# Patient Record
Sex: Female | Born: 1940
Health system: Southern US, Community
[De-identification: ages and names within clinical notes are randomized; demographics above are authoritative.]

## PROBLEM LIST (undated history)

## (undated) DIAGNOSIS — I8390 Asymptomatic varicose veins of unspecified lower extremity: Secondary | ICD-10-CM

## (undated) DIAGNOSIS — E785 Hyperlipidemia, unspecified: Secondary | ICD-10-CM

## (undated) DIAGNOSIS — C801 Malignant (primary) neoplasm, unspecified: Secondary | ICD-10-CM

## (undated) DIAGNOSIS — T7840XA Allergy, unspecified, initial encounter: Secondary | ICD-10-CM

## (undated) DIAGNOSIS — K635 Polyp of colon: Secondary | ICD-10-CM

## (undated) DIAGNOSIS — I10 Essential (primary) hypertension: Secondary | ICD-10-CM

## (undated) DIAGNOSIS — F419 Anxiety disorder, unspecified: Secondary | ICD-10-CM

## (undated) DIAGNOSIS — M5136 Other intervertebral disc degeneration, lumbar region: Secondary | ICD-10-CM

## (undated) DIAGNOSIS — M51369 Other intervertebral disc degeneration, lumbar region without mention of lumbar back pain or lower extremity pain: Secondary | ICD-10-CM

## (undated) DIAGNOSIS — I809 Phlebitis and thrombophlebitis of unspecified site: Secondary | ICD-10-CM

## (undated) DIAGNOSIS — R739 Hyperglycemia, unspecified: Secondary | ICD-10-CM

## (undated) HISTORY — DX: Essential (primary) hypertension: I10

## (undated) HISTORY — DX: Hyperlipidemia, unspecified: E78.5

## (undated) HISTORY — DX: Polyp of colon: K63.5

## (undated) HISTORY — DX: Allergy, unspecified, initial encounter: T78.40XA

## (undated) HISTORY — DX: Phlebitis and thrombophlebitis of unspecified site: I80.9

## (undated) HISTORY — PX: THROMBECTOMY: PRO61

## (undated) HISTORY — DX: Hyperglycemia, unspecified: R73.9

## (undated) HISTORY — DX: Asymptomatic varicose veins of unspecified lower extremity: I83.90

## (undated) HISTORY — PX: JOINT REPLACEMENT: SHX530

---
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
## 1985-03-16 HISTORY — PX: APPENDECTOMY: SHX54

## 1985-03-16 HISTORY — PX: TOTAL ABDOMINAL HYSTERECTOMY W/ BILATERAL SALPINGOOPHORECTOMY: SHX83

## 1985-03-16 HISTORY — PX: ABDOMINAL HYSTERECTOMY: SHX81

## 2002-03-16 HISTORY — PX: COLECTOMY: SHX59

## 2004-08-21 ENCOUNTER — Ambulatory Visit: Payer: Self-pay | Admitting: Internal Medicine

## 2005-10-28 ENCOUNTER — Ambulatory Visit: Payer: Self-pay | Admitting: Internal Medicine

## 2006-11-25 ENCOUNTER — Ambulatory Visit: Payer: Self-pay | Admitting: Internal Medicine

## 2007-03-17 HISTORY — PX: COLONOSCOPY: SHX174

## 2007-09-29 ENCOUNTER — Ambulatory Visit: Payer: Self-pay | Admitting: General Surgery

## 2007-11-29 ENCOUNTER — Ambulatory Visit: Payer: Self-pay | Admitting: Internal Medicine

## 2008-12-03 ENCOUNTER — Ambulatory Visit: Payer: Self-pay | Admitting: Internal Medicine

## 2010-01-10 ENCOUNTER — Ambulatory Visit: Payer: Self-pay | Admitting: Internal Medicine

## 2011-01-28 ENCOUNTER — Ambulatory Visit: Payer: Self-pay | Admitting: Internal Medicine

## 2011-04-20 DIAGNOSIS — M5380 Other specified dorsopathies, site unspecified: Secondary | ICD-10-CM | POA: Diagnosis not present

## 2011-04-20 DIAGNOSIS — M999 Biomechanical lesion, unspecified: Secondary | ICD-10-CM | POA: Diagnosis not present

## 2011-05-01 DIAGNOSIS — L408 Other psoriasis: Secondary | ICD-10-CM | POA: Diagnosis not present

## 2011-05-01 DIAGNOSIS — L820 Inflamed seborrheic keratosis: Secondary | ICD-10-CM | POA: Diagnosis not present

## 2011-05-11 DIAGNOSIS — E119 Type 2 diabetes mellitus without complications: Secondary | ICD-10-CM | POA: Diagnosis not present

## 2011-05-11 DIAGNOSIS — Z79899 Other long term (current) drug therapy: Secondary | ICD-10-CM | POA: Diagnosis not present

## 2011-05-11 DIAGNOSIS — E7800 Pure hypercholesterolemia, unspecified: Secondary | ICD-10-CM | POA: Diagnosis not present

## 2011-05-20 DIAGNOSIS — E119 Type 2 diabetes mellitus without complications: Secondary | ICD-10-CM | POA: Diagnosis not present

## 2011-05-20 DIAGNOSIS — E7800 Pure hypercholesterolemia, unspecified: Secondary | ICD-10-CM | POA: Diagnosis not present

## 2011-05-20 DIAGNOSIS — I10 Essential (primary) hypertension: Secondary | ICD-10-CM | POA: Diagnosis not present

## 2011-06-02 DIAGNOSIS — M999 Biomechanical lesion, unspecified: Secondary | ICD-10-CM | POA: Diagnosis not present

## 2011-06-02 DIAGNOSIS — M5380 Other specified dorsopathies, site unspecified: Secondary | ICD-10-CM | POA: Diagnosis not present

## 2011-08-06 DIAGNOSIS — J3089 Other allergic rhinitis: Secondary | ICD-10-CM | POA: Diagnosis not present

## 2011-08-06 DIAGNOSIS — J45909 Unspecified asthma, uncomplicated: Secondary | ICD-10-CM | POA: Diagnosis not present

## 2011-08-06 DIAGNOSIS — J069 Acute upper respiratory infection, unspecified: Secondary | ICD-10-CM | POA: Diagnosis not present

## 2011-08-06 DIAGNOSIS — J309 Allergic rhinitis, unspecified: Secondary | ICD-10-CM | POA: Diagnosis not present

## 2011-09-10 DIAGNOSIS — E7800 Pure hypercholesterolemia, unspecified: Secondary | ICD-10-CM | POA: Diagnosis not present

## 2011-09-10 DIAGNOSIS — Z79899 Other long term (current) drug therapy: Secondary | ICD-10-CM | POA: Diagnosis not present

## 2011-09-10 DIAGNOSIS — I10 Essential (primary) hypertension: Secondary | ICD-10-CM | POA: Diagnosis not present

## 2011-09-10 DIAGNOSIS — E119 Type 2 diabetes mellitus without complications: Secondary | ICD-10-CM | POA: Diagnosis not present

## 2011-09-18 DIAGNOSIS — I10 Essential (primary) hypertension: Secondary | ICD-10-CM | POA: Diagnosis not present

## 2011-09-18 DIAGNOSIS — E119 Type 2 diabetes mellitus without complications: Secondary | ICD-10-CM | POA: Diagnosis not present

## 2011-09-18 DIAGNOSIS — E7800 Pure hypercholesterolemia, unspecified: Secondary | ICD-10-CM | POA: Diagnosis not present

## 2011-10-07 DIAGNOSIS — N289 Disorder of kidney and ureter, unspecified: Secondary | ICD-10-CM | POA: Diagnosis not present

## 2011-10-15 DIAGNOSIS — M999 Biomechanical lesion, unspecified: Secondary | ICD-10-CM | POA: Diagnosis not present

## 2011-10-15 DIAGNOSIS — M5380 Other specified dorsopathies, site unspecified: Secondary | ICD-10-CM | POA: Diagnosis not present

## 2011-10-22 DIAGNOSIS — J309 Allergic rhinitis, unspecified: Secondary | ICD-10-CM | POA: Diagnosis not present

## 2011-10-22 DIAGNOSIS — J45909 Unspecified asthma, uncomplicated: Secondary | ICD-10-CM | POA: Diagnosis not present

## 2011-10-22 DIAGNOSIS — J019 Acute sinusitis, unspecified: Secondary | ICD-10-CM | POA: Diagnosis not present

## 2011-10-22 DIAGNOSIS — J3089 Other allergic rhinitis: Secondary | ICD-10-CM | POA: Diagnosis not present

## 2011-11-10 DIAGNOSIS — I83893 Varicose veins of bilateral lower extremities with other complications: Secondary | ICD-10-CM | POA: Diagnosis not present

## 2011-11-10 DIAGNOSIS — Z86010 Personal history of colonic polyps: Secondary | ICD-10-CM | POA: Diagnosis not present

## 2011-12-17 DIAGNOSIS — M999 Biomechanical lesion, unspecified: Secondary | ICD-10-CM | POA: Diagnosis not present

## 2011-12-17 DIAGNOSIS — M5380 Other specified dorsopathies, site unspecified: Secondary | ICD-10-CM | POA: Diagnosis not present

## 2012-01-11 ENCOUNTER — Encounter: Payer: Self-pay | Admitting: Internal Medicine

## 2012-01-11 ENCOUNTER — Ambulatory Visit (INDEPENDENT_AMBULATORY_CARE_PROVIDER_SITE_OTHER): Payer: Medicare Other | Admitting: Internal Medicine

## 2012-01-11 VITALS — BP 127/79 | HR 65 | Temp 98.2°F | Ht 64.5 in | Wt 208.0 lb

## 2012-01-11 DIAGNOSIS — E119 Type 2 diabetes mellitus without complications: Secondary | ICD-10-CM | POA: Diagnosis not present

## 2012-01-11 DIAGNOSIS — I10 Essential (primary) hypertension: Secondary | ICD-10-CM

## 2012-01-11 DIAGNOSIS — J019 Acute sinusitis, unspecified: Secondary | ICD-10-CM | POA: Diagnosis not present

## 2012-01-11 DIAGNOSIS — E7800 Pure hypercholesterolemia, unspecified: Secondary | ICD-10-CM

## 2012-01-11 DIAGNOSIS — Z139 Encounter for screening, unspecified: Secondary | ICD-10-CM

## 2012-01-11 DIAGNOSIS — R7309 Other abnormal glucose: Secondary | ICD-10-CM

## 2012-01-11 DIAGNOSIS — R739 Hyperglycemia, unspecified: Secondary | ICD-10-CM

## 2012-01-11 DIAGNOSIS — Z9109 Other allergy status, other than to drugs and biological substances: Secondary | ICD-10-CM

## 2012-01-11 LAB — LIPID PANEL
Cholesterol: 187 mg/dL (ref 0–200)
HDL: 34.9 mg/dL — ABNORMAL LOW (ref >39.00)
Triglycerides: 203 mg/dL — ABNORMAL HIGH (ref 0.0–149.0)

## 2012-01-11 LAB — BASIC METABOLIC PANEL
CO2: 31 mEq/L (ref 19–32)
Calcium: 9.4 mg/dL (ref 8.4–10.5)
Chloride: 99 mEq/L (ref 96–112)
Sodium: 138 mEq/L (ref 135–145)

## 2012-01-11 LAB — HEMOGLOBIN A1C: Hgb A1c MFr Bld: 6.4 % (ref 4.6–6.5)

## 2012-01-11 LAB — LDL CHOLESTEROL, DIRECT: Direct LDL: 114.6 mg/dL

## 2012-01-11 MED ORDER — PRAVASTATIN SODIUM 40 MG PO TABS: 40.0000 mg | ORAL_TABLET | Freq: Every day | ORAL | Status: DC

## 2012-01-11 MED ORDER — CITALOPRAM HYDROBROMIDE 10 MG PO TABS: 10.0000 mg | ORAL_TABLET | Freq: Every day | ORAL | Status: DC

## 2012-01-11 MED ORDER — METOPROLOL SUCCINATE ER 25 MG PO TB24: 25.0000 mg | ORAL_TABLET | Freq: Two times a day (BID) | ORAL | Status: DC

## 2012-01-11 MED ORDER — HYDROCHLOROTHIAZIDE 25 MG PO TABS: 25.0000 mg | ORAL_TABLET | Freq: Every day | ORAL | Status: DC

## 2012-01-11 MED ORDER — CEFDINIR 300 MG PO CAPS: 300.0000 mg | ORAL_CAPSULE | Freq: Two times a day (BID) | ORAL | Status: DC

## 2012-01-11 NOTE — Patient Instructions (Addendum)
It was nice seeing you today.  I am glad you have been doing well.  I am sorry you have had some issues lately with your sinuses.  I am going to give you an antibiotic Carrie Mills) to take twice a day.  Continue your saline flushes and prescription nasal spray as you have been doing.  Continue mucinex as you have been doing.  Let me know if persistent problems.

## 2012-01-12 ENCOUNTER — Encounter: Payer: Self-pay | Admitting: Internal Medicine

## 2012-01-12 DIAGNOSIS — E7800 Pure hypercholesterolemia, unspecified: Secondary | ICD-10-CM | POA: Insufficient documentation

## 2012-01-12 DIAGNOSIS — I10 Essential (primary) hypertension: Secondary | ICD-10-CM | POA: Insufficient documentation

## 2012-01-12 DIAGNOSIS — E119 Type 2 diabetes mellitus without complications: Secondary | ICD-10-CM | POA: Insufficient documentation

## 2012-01-12 DIAGNOSIS — Z9109 Other allergy status, other than to drugs and biological substances: Secondary | ICD-10-CM | POA: Insufficient documentation

## 2012-01-12 NOTE — Assessment & Plan Note (Signed)
Blood pressure under good control.  Follow.  Check metabolic panel.    

## 2012-01-12 NOTE — Assessment & Plan Note (Signed)
Low cholesterol diet and exercise.  She has declined cholesterol medication.  Check lipid panel.

## 2012-01-12 NOTE — Progress Notes (Signed)
  Subjective:    Patient ID: Carrie Mills, female    DOB: May 20, 1940, 71 y.o.   MRN: [id number]  HPI 71 year old female with past history of hypertension, hypercholesterolemia and hyperglycemia who comes in today for a scheduled follow up.  States she has been doing well.  Feels she is handling stress relatively well.  Has a lot of stress with her sons medical issues.  Sugars (per her report) had been doing well until she was put on Prednisone for a sinus infection.  Saw Dr [place] Callas.  They had started to return to her baseline, but now she has developed more sinus symptoms.  States symptoms returned approximately five days ago.  Increased sinus pressure and ears feel full.  Increased post nasal drainage with sore throat.  Increased nasal congestion - colored mucus.  No chest congestion or tightness.  No sob.    Past Medical History  Diagnosis Date  . Allergy   . Hypertension   . Hyperlipidemia   . Colon polyps   . Phlebitis   . Hyperglycemia       Review of Systems Patient denies any headache, lightheadedness or dizziness.  Sinus symptoms as outlined.  No chest pain, tightness or palpitations.  No increased shortness of breath.  No nausea or vomiting.  No abdominal pain or cramping.  No bowel change, such as diarrhea, constipation, BRBPR or melana.  No urine change.        Objective:   Physical Exam Filed Vitals:   01/11/12 1039  BP: 127/79  Pulse: 65  Temp: 98.2 F (36.8 C)   Blood pressure recheck:  38/30  71 year old female in no acute distress.   HEENT:  Nares - clear except slightly erythematous turbinates.  Right ear canal - impacted with cerumen.  Left clear.  Left TM clear.   OP- without lesions or erythema.  Minimal tenderness to palpation over the maxillary sinus.   NECK:  Supple, nontender.  No audible bruit.   HEART:  Appears to be regular. LUNGS:  Without crackles or wheezing audible.  Respirations even and unlabored.   RADIAL PULSE:  Equal bilaterally.  ABDOMEN:   Soft, nontender.  No audible abdominal bruit.   EXTREMITIES:  No increased edema to be present.  Support hose in place.                Assessment & Plan:  PROBABLE SINUSITIS/URI.  Treat with Omnicef 300mg  2/day as directed.  Saline nasal spray and Nasacort as directed.  Mucinex DM in the am and Robitussin DM in the evening.  Notify me if symptoms do not resolve.    INCREASED PSYCHOSOCIAL STRESSORS.  Doing well on Celexa.  Follow.   HEALTH MAINTENANCE.  Schedule her physical for next visit. Schedule mammogram when due.  Check cholesterol.  She is s/p hysterectomy and does not require pap smears.

## 2012-01-12 NOTE — Assessment & Plan Note (Signed)
Low carb diet and exercise.  Follow.  Check met b and a1c.    

## 2012-01-12 NOTE — Assessment & Plan Note (Signed)
Sees Dr [place] Callas.  Treat the current sinus infection.

## 2012-01-13 ENCOUNTER — Encounter: Payer: Self-pay | Admitting: *Deleted

## 2012-01-13 NOTE — Progress Notes (Signed)
Result letter sent to patient

## 2012-01-26 DIAGNOSIS — H40009 Preglaucoma, unspecified, unspecified eye: Secondary | ICD-10-CM | POA: Diagnosis not present

## 2012-01-29 DIAGNOSIS — L408 Other psoriasis: Secondary | ICD-10-CM | POA: Diagnosis not present

## 2012-01-29 DIAGNOSIS — Z85828 Personal history of other malignant neoplasm of skin: Secondary | ICD-10-CM | POA: Diagnosis not present

## 2012-02-01 DIAGNOSIS — M5380 Other specified dorsopathies, site unspecified: Secondary | ICD-10-CM | POA: Diagnosis not present

## 2012-02-01 DIAGNOSIS — M999 Biomechanical lesion, unspecified: Secondary | ICD-10-CM | POA: Diagnosis not present

## 2012-02-19 ENCOUNTER — Ambulatory Visit: Payer: Self-pay | Admitting: Internal Medicine

## 2012-02-19 DIAGNOSIS — Z1231 Encounter for screening mammogram for malignant neoplasm of breast: Secondary | ICD-10-CM | POA: Diagnosis not present

## 2012-02-19 LAB — HM MAMMOGRAPHY

## 2012-02-20 ENCOUNTER — Telehealth: Payer: Self-pay | Admitting: Internal Medicine

## 2012-02-20 NOTE — Telephone Encounter (Signed)
Updated health maintenance.  mammo 02/19/12 - ok

## 2012-03-02 ENCOUNTER — Other Ambulatory Visit: Payer: Self-pay | Admitting: Internal Medicine

## 2012-03-02 DIAGNOSIS — M999 Biomechanical lesion, unspecified: Secondary | ICD-10-CM | POA: Diagnosis not present

## 2012-03-02 DIAGNOSIS — M5380 Other specified dorsopathies, site unspecified: Secondary | ICD-10-CM | POA: Diagnosis not present

## 2012-03-02 MED ORDER — GLUCOSE BLOOD VI STRP: ORAL_STRIP | Status: DC

## 2012-03-02 NOTE — Telephone Encounter (Signed)
Accu Check test stripes # 100 5 R sent to [facility]

## 2012-03-02 NOTE — Telephone Encounter (Signed)
Accu-chek aviva plus test strip  Use 1 strip once a day test once daily   # 100

## 2012-03-03 ENCOUNTER — Other Ambulatory Visit: Payer: Self-pay

## 2012-03-10 ENCOUNTER — Encounter: Payer: Self-pay | Admitting: Internal Medicine

## 2012-03-14 ENCOUNTER — Other Ambulatory Visit (HOSPITAL_COMMUNITY)
Admission: RE | Admit: 2012-03-14 | Discharge: 2012-03-14 | Disposition: A | Discharge status: Home / Self Care | Payer: Medicare Other | Source: Ambulatory Visit | Attending: Internal Medicine | Admitting: Internal Medicine

## 2012-03-14 ENCOUNTER — Ambulatory Visit (INDEPENDENT_AMBULATORY_CARE_PROVIDER_SITE_OTHER): Payer: Medicare Other | Admitting: Internal Medicine

## 2012-03-14 ENCOUNTER — Encounter: Payer: Self-pay | Admitting: Internal Medicine

## 2012-03-14 VITALS — BP 120/70 | HR 70 | Temp 97.7°F | Ht 64.5 in | Wt 206.2 lb

## 2012-03-14 DIAGNOSIS — I10 Essential (primary) hypertension: Secondary | ICD-10-CM | POA: Diagnosis not present

## 2012-03-14 DIAGNOSIS — Z139 Encounter for screening, unspecified: Secondary | ICD-10-CM | POA: Diagnosis not present

## 2012-03-14 DIAGNOSIS — R7309 Other abnormal glucose: Secondary | ICD-10-CM | POA: Diagnosis not present

## 2012-03-14 DIAGNOSIS — E7800 Pure hypercholesterolemia, unspecified: Secondary | ICD-10-CM

## 2012-03-14 DIAGNOSIS — R739 Hyperglycemia, unspecified: Secondary | ICD-10-CM

## 2012-03-14 DIAGNOSIS — R5381 Other malaise: Secondary | ICD-10-CM

## 2012-03-14 DIAGNOSIS — Z124 Encounter for screening for malignant neoplasm of cervix: Secondary | ICD-10-CM | POA: Diagnosis not present

## 2012-03-14 DIAGNOSIS — Z9109 Other allergy status, other than to drugs and biological substances: Secondary | ICD-10-CM

## 2012-03-14 DIAGNOSIS — R5383 Other fatigue: Secondary | ICD-10-CM

## 2012-03-14 LAB — HM PAP SMEAR

## 2012-03-14 MED ORDER — MOMETASONE FUROATE 0.1 % EX SOLN: CUTANEOUS | Status: DC

## 2012-03-14 MED ORDER — HYDROCORTISONE ACE-PRAMOXINE 1-1 % RE CREA: TOPICAL_CREAM | Freq: Two times a day (BID) | RECTAL | Status: DC

## 2012-03-20 ENCOUNTER — Encounter: Payer: Self-pay | Admitting: Internal Medicine

## 2012-03-20 NOTE — Assessment & Plan Note (Signed)
Sees Dr Sharma.  Stable.    

## 2012-03-20 NOTE — Assessment & Plan Note (Signed)
Low cholesterol diet and exercise.  On no medication.  Follow.

## 2012-03-20 NOTE — Assessment & Plan Note (Signed)
Low carb diet and exercise.  Follow metabolic panel and a1c.  Keep up to date with eye exams.    

## 2012-03-20 NOTE — Assessment & Plan Note (Signed)
Blood pressure under good control.  Same medication regimen.  Check metabolic panel.    

## 2012-03-20 NOTE — Progress Notes (Signed)
Subjective:    Patient ID: Carrie Mills, female    DOB: 12-May-1940, 72 y.o.   MRN: [id number]  HPI 72 year old female with past history of hypertension, hypercholesterolemia and hyperglycemia who comes in today to follow up on these issues as well as for a complete physical exam.  States she has been doing well.  Feels she is handling stress relatively well.  Has a lot of stress with her sons medical issues.  Sugars in the am averaging 109-123 and pm sugars averaging 120-150.  Breathing is stable.  Symptoms have improved.  She does intermittently have some left back discomfort and hips and knees bother her at times.  Takes occasional alleve.  No nausea or vomiting.  No cardiac symptoms with increased activity or exertion.   Past Medical History  Diagnosis Date  . Allergy   . Hypertension   . Hyperlipidemia   . Colon polyps   . Phlebitis   . Hyperglycemia      Current Outpatient Prescriptions on File Prior to Visit  Medication Sig Dispense Refill  . budesonide-formoterol (SYMBICORT) 160-4.5 MCG/ACT inhaler Inhale 2 puffs into the lungs daily.      . Calcium Carbonate Antacid 400 MG CHEW Chew by mouth daily.      . cefdinir (OMNICEF) 300 MG capsule Take 1 capsule (300 mg total) by mouth 2 (two) times daily.  20 capsule  0  . citalopram (CELEXA) 10 MG tablet Take 1 tablet (10 mg total) by mouth daily.  90 tablet  3  . Coconut Oil 1000 MG CAPS Take by mouth.      . Glucosamine HCl 1000 MG TABS Take by mouth.      Marland Kitchen glucose blood (ACCU-CHEK AVIVA) test strip Test blood sugar once a day or as instructed. DX 250.0  100 each  5  . hydrochlorothiazide (HYDRODIURIL) 25 MG tablet Take 1 tablet (25 mg total) by mouth daily.  90 tablet  3  . metoprolol succinate (TOPROL-XL) 25 MG 24 hr tablet Take 1 tablet (25 mg total) by mouth 2 (two) times daily.  180 tablet  3  . Multiple Vitamin (MULTIVITAMIN) capsule Take 1 capsule by mouth daily.      . Omega-3 Fatty Acids (FISH OIL) 1200 MG CAPS Take by  mouth daily.      . pravastatin (PRAVACHOL) 40 MG tablet Take 1 tablet (40 mg total) by mouth daily.  90 tablet  3  . sodium chloride (OCEAN) 0.65 % nasal spray Place 1 spray into the nose as needed.      . triamcinolone (NASACORT) 55 MCG/ACT nasal inhaler Place 2 sprays into the nose daily.         Review of Systems Patient denies any headache, lightheadedness or dizziness.  No significant sinus or allergy symptoms.  No chest pain, tightness or palpitations.  No increased shortness of breath.  No nausea or vomiting.  No abdominal pain or cramping. No acid reflux.  No bowel change, such as diarrhea, constipation, BRBPR or melana.  No urine change.        Objective:   Physical Exam  Filed Vitals:   03/14/12 0908  BP: 120/70  Pulse: 70  Temp: 97.7 F (36.5 C)   Blood pressure recheck:  37/27  72 year old female in no acute distress.   HEENT:  Nares- clear.  Oropharynx - without lesions. NECK:  Supple.  Nontender.  No audible bruit.  HEART:  Appears to be regular. LUNGS:  No crackles or  wheezing audible.  Respirations even and unlabored.  RADIAL PULSE:  Equal bilaterally.    BREASTS:  No nipple discharge or nipple retraction present.  Could not appreciate any distinct nodules or axillary adenopathy.  ABDOMEN:  Soft, nontender.  Bowel sounds present and normal.  No audible abdominal bruit.  GU:  Normal external genitalia.  Vaginal vault without lesions.  S/p hysterectomy.  Could not appreciate any adnexal masses or tenderness.   RECTAL:  Heme negative.   EXTREMITIES:  No increased edema present.  DP pulses palpable and equal bilaterally.       Assessment & Plan:  REOCCURRING ALLERGY/SINUS ISSUES.  Sees Dr [place] Callas.  Doing well.      INCREASED PSYCHOSOCIAL STRESSORS.  Doing well on Celexa.  Follow.   HEALTH MAINTENANCE.  Physical today.  Mammogram 02/19/12 - BiRADS II.   She is s/p hysterectomy and does not require pap smears.  IFOB.

## 2012-04-01 ENCOUNTER — Telehealth: Payer: Self-pay | Admitting: General Practice

## 2012-04-01 NOTE — Telephone Encounter (Signed)
Melanie from [facility] called wanting to know what records we needed for pt. Their office closes at 1pm. Please call her back at [phone].

## 2012-04-04 NOTE — Telephone Encounter (Signed)
Dr. Lorin Picket, if you tell me what records we need from them I will be glad to call them back.

## 2012-04-04 NOTE — Telephone Encounter (Signed)
I am not sure.  Do they have any recent information on pt?  If they do then if they could send the last couple of notes.  Thanks.

## 2012-04-06 NOTE — Telephone Encounter (Signed)
I called and spoke with Carrie Mills and she is going to fax over the last few notes.

## 2012-04-07 DIAGNOSIS — J3089 Other allergic rhinitis: Secondary | ICD-10-CM | POA: Diagnosis not present

## 2012-04-07 DIAGNOSIS — J019 Acute sinusitis, unspecified: Secondary | ICD-10-CM | POA: Diagnosis not present

## 2012-04-07 DIAGNOSIS — J45909 Unspecified asthma, uncomplicated: Secondary | ICD-10-CM | POA: Diagnosis not present

## 2012-04-07 DIAGNOSIS — J209 Acute bronchitis, unspecified: Secondary | ICD-10-CM | POA: Diagnosis not present

## 2012-04-07 DIAGNOSIS — J309 Allergic rhinitis, unspecified: Secondary | ICD-10-CM | POA: Diagnosis not present

## 2012-04-13 ENCOUNTER — Ambulatory Visit: Payer: Self-pay | Admitting: Allergy

## 2012-04-13 DIAGNOSIS — J019 Acute sinusitis, unspecified: Secondary | ICD-10-CM | POA: Diagnosis not present

## 2012-04-13 DIAGNOSIS — J45909 Unspecified asthma, uncomplicated: Secondary | ICD-10-CM | POA: Diagnosis not present

## 2012-04-28 ENCOUNTER — Encounter: Payer: Self-pay | Admitting: Internal Medicine

## 2012-04-28 ENCOUNTER — Telehealth: Payer: Self-pay | Admitting: Internal Medicine

## 2012-04-28 NOTE — Telephone Encounter (Signed)
Opened in error

## 2012-04-29 ENCOUNTER — Telehealth: Payer: Self-pay | Admitting: Internal Medicine

## 2012-04-29 MED ORDER — CITALOPRAM HYDROBROMIDE 10 MG PO TABS: 10.0000 mg | ORAL_TABLET | Freq: Every day | ORAL | Status: DC

## 2012-04-29 MED ORDER — HYDROCHLOROTHIAZIDE 25 MG PO TABS: 25.0000 mg | ORAL_TABLET | Freq: Every day | ORAL | Status: DC

## 2012-04-29 MED ORDER — METOPROLOL SUCCINATE ER 25 MG PO TB24: 25.0000 mg | ORAL_TABLET | Freq: Two times a day (BID) | ORAL | Status: DC

## 2012-04-29 NOTE — Telephone Encounter (Signed)
[facility] with 3 refills for celexa, toprol and hctz

## 2012-04-30 ENCOUNTER — Other Ambulatory Visit: Payer: Self-pay

## 2012-05-24 DIAGNOSIS — M5380 Other specified dorsopathies, site unspecified: Secondary | ICD-10-CM | POA: Diagnosis not present

## 2012-05-24 DIAGNOSIS — M999 Biomechanical lesion, unspecified: Secondary | ICD-10-CM | POA: Diagnosis not present

## 2012-06-09 ENCOUNTER — Other Ambulatory Visit (INDEPENDENT_AMBULATORY_CARE_PROVIDER_SITE_OTHER): Payer: Medicare Other

## 2012-06-09 DIAGNOSIS — I10 Essential (primary) hypertension: Secondary | ICD-10-CM | POA: Diagnosis not present

## 2012-06-09 DIAGNOSIS — R5381 Other malaise: Secondary | ICD-10-CM | POA: Diagnosis not present

## 2012-06-09 DIAGNOSIS — R7309 Other abnormal glucose: Secondary | ICD-10-CM

## 2012-06-09 DIAGNOSIS — E7800 Pure hypercholesterolemia, unspecified: Secondary | ICD-10-CM | POA: Diagnosis not present

## 2012-06-09 DIAGNOSIS — R739 Hyperglycemia, unspecified: Secondary | ICD-10-CM

## 2012-06-09 DIAGNOSIS — R5383 Other fatigue: Secondary | ICD-10-CM

## 2012-06-09 LAB — COMPREHENSIVE METABOLIC PANEL
Albumin: 3.7 g/dL (ref 3.5–5.2)
Alkaline Phosphatase: 50 U/L (ref 39–117)
BUN: 13 mg/dL (ref 6–23)
CO2: 30 mEq/L (ref 19–32)
GFR: 68.68 mL/min (ref >60.00)
Glucose, Bld: 105 mg/dL — ABNORMAL HIGH (ref 70–99)
Total Bilirubin: 0.7 mg/dL (ref 0.3–1.2)
Total Protein: 6.7 g/dL (ref 6.0–8.3)

## 2012-06-09 LAB — CBC WITH DIFFERENTIAL/PLATELET
Basophils Relative: 0.5 % (ref 0.0–3.0)
Eosinophils Relative: 6.1 % — ABNORMAL HIGH (ref 0.0–5.0)
HCT: 37.5 % (ref 36.0–46.0)
Hemoglobin: 12.6 g/dL (ref 12.0–15.0)
Lymphs Abs: 2.8 10*3/uL (ref 0.7–4.0)
MCV: 89.3 fl (ref 78.0–100.0)
Monocytes Absolute: 0.6 10*3/uL (ref 0.1–1.0)
Monocytes Relative: 7.1 % (ref 3.0–12.0)
RBC: 4.2 Mil/uL (ref 3.87–5.11)
WBC: 9 10*3/uL (ref 4.5–10.5)

## 2012-06-09 LAB — LIPID PANEL
HDL: 35.2 mg/dL — ABNORMAL LOW (ref >39.00)
Total CHOL/HDL Ratio: 5
Triglycerides: 173 mg/dL — ABNORMAL HIGH (ref 0.0–149.0)

## 2012-06-09 LAB — TSH: TSH: 2.45 u[IU]/mL (ref 0.35–5.50)

## 2012-06-09 LAB — HEMOGLOBIN A1C: Hgb A1c MFr Bld: 6.6 % — ABNORMAL HIGH (ref 4.6–6.5)

## 2012-06-14 ENCOUNTER — Telehealth: Payer: Self-pay | Admitting: Internal Medicine

## 2012-06-14 ENCOUNTER — Other Ambulatory Visit (INDEPENDENT_AMBULATORY_CARE_PROVIDER_SITE_OTHER): Payer: Medicare Other

## 2012-06-14 DIAGNOSIS — Z139 Encounter for screening, unspecified: Secondary | ICD-10-CM | POA: Diagnosis not present

## 2012-06-14 LAB — FECAL OCCULT BLOOD, IMMUNOCHEMICAL: Fecal Occult Bld: NEGATIVE

## 2012-06-14 NOTE — Telephone Encounter (Signed)
Notified of lab result - negative stool test via my chart.

## 2012-06-15 ENCOUNTER — Ambulatory Visit (INDEPENDENT_AMBULATORY_CARE_PROVIDER_SITE_OTHER): Payer: Medicare Other | Admitting: Internal Medicine

## 2012-06-15 ENCOUNTER — Encounter: Payer: Self-pay | Admitting: Internal Medicine

## 2012-06-15 VITALS — BP 120/60 | HR 69 | Temp 98.1°F | Ht 64.5 in | Wt 206.5 lb

## 2012-06-15 DIAGNOSIS — R739 Hyperglycemia, unspecified: Secondary | ICD-10-CM

## 2012-06-15 DIAGNOSIS — M25562 Pain in left knee: Secondary | ICD-10-CM

## 2012-06-15 DIAGNOSIS — E7800 Pure hypercholesterolemia, unspecified: Secondary | ICD-10-CM

## 2012-06-15 DIAGNOSIS — I10 Essential (primary) hypertension: Secondary | ICD-10-CM

## 2012-06-15 DIAGNOSIS — R7309 Other abnormal glucose: Secondary | ICD-10-CM | POA: Diagnosis not present

## 2012-06-15 DIAGNOSIS — Z9109 Other allergy status, other than to drugs and biological substances: Secondary | ICD-10-CM | POA: Diagnosis not present

## 2012-06-15 DIAGNOSIS — M25569 Pain in unspecified knee: Secondary | ICD-10-CM | POA: Diagnosis not present

## 2012-06-15 MED ORDER — CEPHALEXIN 500 MG PO CAPS: 500.0000 mg | ORAL_CAPSULE | Freq: Three times a day (TID) | ORAL | Status: DC

## 2012-06-15 NOTE — Assessment & Plan Note (Signed)
Low carb diet and exercise.  Follow metabolic panel and a1c.  Keep up to date with eye exams.  A1c just checked - 6.6.  Follow.

## 2012-06-15 NOTE — Assessment & Plan Note (Signed)
Sees Dr [place] Callas.  Just treated for sinus infection.  Doing well now.  Follow.

## 2012-06-15 NOTE — Progress Notes (Signed)
Subjective:    Patient ID: Carrie Mills, female    DOB: 10/04/1940, 72 y.o.   MRN: [id number]  HPI 72 year old female with past history of hypertension, hypercholesterolemia and hyperglycemia who comes in today for a scheduled follow up.  States she has been doing well.  Feels she is handling stress relatively well.  Has a lot of stress with her sons medical issues.  Sugars in the am averaging 100-120s and pm sugars averaging 120-160.  Breathing is stable.   No nausea or vomiting.  No cardiac symptoms with increased activity or exertion.  Is riding her bike.  Not able to walk.  Her knee limits her.  Has been having increased knee pain for a while and is now ready to have it looked at.  Wants to see Dr Ernest Pine.   Saw Dr [place] Callas.  Had CXR - ok.  CT sinuses.  Infection.  Treated with Levaquin for 10 days.  Is having some increased pain and redness in her left thumb.  Just a localized area.  Able to bend without significant problems.     Past Medical History  Diagnosis Date  . Allergy   . Hypertension   . Hyperlipidemia   . Colon polyps   . Phlebitis   . Hyperglycemia      Current Outpatient Prescriptions on File Prior to Visit  Medication Sig Dispense Refill  . budesonide-formoterol (SYMBICORT) 160-4.5 MCG/ACT inhaler Inhale 2 puffs into the lungs daily.      . Calcium Carbonate Antacid 400 MG CHEW Chew by mouth daily.      . citalopram (CELEXA) 10 MG tablet Take 1 tablet (10 mg total) by mouth daily.  90 tablet  3  . Coconut Oil 1000 MG CAPS Take by mouth.      . Glucosamine HCl 1000 MG TABS Take by mouth.      Marland Kitchen glucose blood (ACCU-CHEK AVIVA) test strip Test blood sugar once a day or as instructed. DX 250.0  100 each  5  . hydrochlorothiazide (HYDRODIURIL) 25 MG tablet Take 1 tablet (25 mg total) by mouth daily.  90 tablet  3  . metoprolol succinate (TOPROL-XL) 25 MG 24 hr tablet Take 1 tablet (25 mg total) by mouth 2 (two) times daily.  180 tablet  3  . mometasone (ELOCON) 0.1 % lotion  Use as directed  60 mL  0  . Multiple Vitamin (MULTIVITAMIN) capsule Take 1 capsule by mouth daily.      . Omega-3 Fatty Acids (FISH OIL) 1200 MG CAPS Take by mouth daily.      . pramoxine-hydrocortisone (ANALPRAM-HC) 1-1 % rectal cream Place rectally 2 (two) times daily.  30 g  0  . pravastatin (PRAVACHOL) 40 MG tablet Take 1 tablet (40 mg total) by mouth daily.  90 tablet  3  . sodium chloride (OCEAN) 0.65 % nasal spray Place 1 spray into the nose as needed.      . triamcinolone (NASACORT) 55 MCG/ACT nasal inhaler Place 2 sprays into the nose daily.      . cefdinir (OMNICEF) 300 MG capsule Take 1 capsule (300 mg total) by mouth 2 (two) times daily.  20 capsule  0   No current facility-administered medications on file prior to visit.     Review of Systems Patient denies any headache, lightheadedness or dizziness.  No significant sinus or allergy symptoms now.  Just treated with Levaquin for sinus infection.   No chest pain, tightness or palpitations.  No increased shortness  of breath.  No nausea or vomiting.  No abdominal pain or cramping. No acid reflux.  No bowel change, such as diarrhea, constipation, BRBPR or melana.  No urine change.  Persistent knee pain.  Request referral to ortho.      Objective:   Physical Exam  Filed Vitals:   06/15/12 0943  BP: 120/60  Pulse: 69  Temp: 98.1 F (36.7 C)   Blood pressure recheck:  41/75  72 year old female in no acute distress.   HEENT:  Nares- clear.  Oropharynx - without lesions. NECK:  Supple.  Nontender.  No audible bruit.  HEART:  Appears to be regular. LUNGS:  No crackles or wheezing audible.  Respirations even and unlabored.  RADIAL PULSE:  Equal bilaterally.   ABDOMEN:  Soft, nontender.  Bowel sounds present and normal.  No audible abdominal bruit.    EXTREMITIES:  No increased edema present.  DP pulses palpable and equal bilaterally.   SKIN:  Minimal localized are of redness just below the nail bed - left thumb.  Minima  tenderness.  Able to flex and extend the thumb without increased pain.    Assessment & Plan:  REOCCURRING ALLERGY/SINUS ISSUES.  Sees Dr [place] Callas.  Doing well now.  Just treated for sinus infection.  Follow.    POSSIBLE CELLULITIS.  Base of thumb.  Exam as outlined.  Treat with Keflex 500mg  tid x 1 week.  Warm compresses.  Call with update.    MSK.  Persistent knee pain.  Refer to Dr Ernest Pine for evaluation.     INCREASED PSYCHOSOCIAL STRESSORS.  Doing well on Celexa.  Follow.   HEALTH MAINTENANCE.  Physical 03/14/12.  Mammogram 02/19/12 - BiRADS II.   She is s/p hysterectomy and does not require pap smears.

## 2012-06-15 NOTE — Assessment & Plan Note (Signed)
Low cholesterol diet and exercise.  On no medication.  Follow.

## 2012-06-15 NOTE — Assessment & Plan Note (Signed)
Blood pressure under good control.  Same medication regimen.  Follow metabolic panel.   

## 2012-08-02 DIAGNOSIS — M1710 Unilateral primary osteoarthritis, unspecified knee: Secondary | ICD-10-CM | POA: Diagnosis not present

## 2012-08-05 ENCOUNTER — Encounter: Payer: Self-pay | Admitting: *Deleted

## 2012-08-24 DIAGNOSIS — M549 Dorsalgia, unspecified: Secondary | ICD-10-CM | POA: Diagnosis not present

## 2012-08-24 DIAGNOSIS — M5380 Other specified dorsopathies, site unspecified: Secondary | ICD-10-CM | POA: Diagnosis not present

## 2012-08-24 DIAGNOSIS — M5137 Other intervertebral disc degeneration, lumbosacral region: Secondary | ICD-10-CM | POA: Diagnosis not present

## 2012-08-24 DIAGNOSIS — M25569 Pain in unspecified knee: Secondary | ICD-10-CM | POA: Diagnosis not present

## 2012-08-24 DIAGNOSIS — M999 Biomechanical lesion, unspecified: Secondary | ICD-10-CM | POA: Diagnosis not present

## 2012-10-06 DIAGNOSIS — M999 Biomechanical lesion, unspecified: Secondary | ICD-10-CM | POA: Diagnosis not present

## 2012-10-06 DIAGNOSIS — J3089 Other allergic rhinitis: Secondary | ICD-10-CM | POA: Diagnosis not present

## 2012-10-06 DIAGNOSIS — J309 Allergic rhinitis, unspecified: Secondary | ICD-10-CM | POA: Diagnosis not present

## 2012-10-06 DIAGNOSIS — J45909 Unspecified asthma, uncomplicated: Secondary | ICD-10-CM | POA: Diagnosis not present

## 2012-10-06 DIAGNOSIS — M5380 Other specified dorsopathies, site unspecified: Secondary | ICD-10-CM | POA: Diagnosis not present

## 2012-10-06 DIAGNOSIS — M25569 Pain in unspecified knee: Secondary | ICD-10-CM | POA: Diagnosis not present

## 2012-10-06 DIAGNOSIS — M549 Dorsalgia, unspecified: Secondary | ICD-10-CM | POA: Diagnosis not present

## 2012-10-06 DIAGNOSIS — M5137 Other intervertebral disc degeneration, lumbosacral region: Secondary | ICD-10-CM | POA: Diagnosis not present

## 2012-10-19 ENCOUNTER — Other Ambulatory Visit: Payer: Self-pay

## 2012-10-24 ENCOUNTER — Encounter: Payer: Self-pay | Admitting: Internal Medicine

## 2012-10-24 ENCOUNTER — Ambulatory Visit (INDEPENDENT_AMBULATORY_CARE_PROVIDER_SITE_OTHER): Payer: Medicare Other | Admitting: Internal Medicine

## 2012-10-24 VITALS — BP 130/70 | HR 63 | Temp 98.2°F | Ht 64.5 in | Wt 202.8 lb

## 2012-10-24 DIAGNOSIS — E7800 Pure hypercholesterolemia, unspecified: Secondary | ICD-10-CM | POA: Diagnosis not present

## 2012-10-24 DIAGNOSIS — I10 Essential (primary) hypertension: Secondary | ICD-10-CM | POA: Diagnosis not present

## 2012-10-24 DIAGNOSIS — R739 Hyperglycemia, unspecified: Secondary | ICD-10-CM

## 2012-10-24 DIAGNOSIS — Z9109 Other allergy status, other than to drugs and biological substances: Secondary | ICD-10-CM

## 2012-10-24 DIAGNOSIS — R7309 Other abnormal glucose: Secondary | ICD-10-CM | POA: Diagnosis not present

## 2012-10-24 LAB — HEMOGLOBIN A1C: Hgb A1c MFr Bld: 6.7 % — ABNORMAL HIGH (ref 4.6–6.5)

## 2012-10-24 LAB — COMPREHENSIVE METABOLIC PANEL
ALT: 25 U/L (ref 0–35)
Albumin: 3.7 g/dL (ref 3.5–5.2)
CO2: 29 mEq/L (ref 19–32)
Calcium: 9.3 mg/dL (ref 8.4–10.5)
Chloride: 98 mEq/L (ref 96–112)
GFR: 67.7 mL/min (ref >60.00)
Glucose, Bld: 112 mg/dL — ABNORMAL HIGH (ref 70–99)
Sodium: 137 mEq/L (ref 135–145)
Total Bilirubin: 0.9 mg/dL (ref 0.3–1.2)
Total Protein: 6.8 g/dL (ref 6.0–8.3)

## 2012-10-24 LAB — LIPID PANEL
Cholesterol: 163 mg/dL (ref 0–200)
VLDL: 40 mg/dL (ref 0.0–40.0)

## 2012-10-24 NOTE — Progress Notes (Signed)
Subjective:    Patient ID: Carrie Mills, female    DOB: [birth year], 72 y.o.   MRN: [id number]  HPI 72 year old female with past history of hypertension, hypercholesterolemia and hyperglycemia who comes in today for a scheduled follow up.  States she has been doing well.  Feels she is handling stress relatively well.  Has a lot of stress with her sons medical issues.  Noticed increased sugars back at the end of May and first of June.  Had increased stress.  Also increased pain in her knee.  felt this may have contributed.  Sugars have improved now.  Sugars in the am averaging 110-120s and pm sugars averaging 130-140s now.  Breathing is stable.   No nausea or vomiting.  No cardiac symptoms with increased activity or exertion.  Is riding her bike. Rode eight miles yesterday.  No chest pain or tightness with increased activity or exertion.   Not able to walk.  Her knee limits her. Has been having increased knee pain for a while and is now ready to have it looked at.  Planning to see Dr Ernest Pine tomorrow.   Sees Dr [place] Callas.  Allergies stable.  Overall she feels she is doing well.     Past Medical History  Diagnosis Date  . Allergy   . Hyperlipidemia   . Colon polyps   . Phlebitis   . Hyperglycemia   . Hypertension   . Varicose veins      Current Outpatient Prescriptions on File Prior to Visit  Medication Sig Dispense Refill  . budesonide-formoterol (SYMBICORT) 160-4.5 MCG/ACT inhaler Inhale 2 puffs into the lungs daily.      . Calcium Carbonate Antacid 400 MG CHEW Chew by mouth daily.      . citalopram (CELEXA) 10 MG tablet Take 1 tablet (10 mg total) by mouth daily.  90 tablet  3  . Coconut Oil 1000 MG CAPS Take by mouth.      . Glucosamine HCl 1000 MG TABS Take by mouth.      Marland Kitchen glucose blood (ACCU-CHEK AVIVA) test strip Test blood sugar once a day or as instructed. DX 250.0  100 each  5  . hydrochlorothiazide (HYDRODIURIL) 25 MG tablet Take 1 tablet (25 mg total) by mouth daily.  90 tablet  3   . metoprolol succinate (TOPROL-XL) 25 MG 24 hr tablet Take 1 tablet (25 mg total) by mouth 2 (two) times daily.  180 tablet  3  . mometasone (ELOCON) 0.1 % lotion Use as directed  60 mL  0  . Montelukast Sodium (SINGULAIR PO) Take by mouth daily.      . Multiple Vitamin (MULTIVITAMIN) capsule Take 1 capsule by mouth daily.      . Omega-3 Fatty Acids (FISH OIL) 1200 MG CAPS Take by mouth daily.      . pramoxine-hydrocortisone (ANALPRAM-HC) 1-1 % rectal cream Place rectally 2 (two) times daily.  30 g  0  . pravastatin (PRAVACHOL) 40 MG tablet Take 1 tablet (40 mg total) by mouth daily.  90 tablet  3  . sodium chloride (OCEAN) 0.65 % nasal spray Place 1 spray into the nose as needed.      . triamcinolone (NASACORT) 55 MCG/ACT nasal inhaler Place 2 sprays into the nose daily.       No current facility-administered medications on file prior to visit.     Review of Systems Patient denies any headache, lightheadedness or dizziness.  No significant sinus or allergy symptoms now.  No  chest pain, tightness or palpitations.  See above.  No cardiac symptoms with increased activity or exertion.   No increased shortness of breath.  No nausea or vomiting.  No abdominal pain or cramping. No acid reflux.  No bowel change, such as diarrhea, constipation, BRBPR or melana.   No urine change.  Persistent knee pain.  Worsening.  Planning to see Dr Ernest Pine tomorrow.      Objective:   Physical Exam  Filed Vitals:   10/24/12 1005  BP: 130/70  Pulse: 63  Temp: 98.2 F (36.8 C)   Blood pressure recheck:  122/74, pulse 36  72 year old female in no acute distress.   HEENT:  Nares- clear.  Oropharynx - without lesions. NECK:  Supple.  Nontender.  No audible bruit.  HEART:  Appears to be regular. LUNGS:  No crackles or wheezing audible.  Respirations even and unlabored.  RADIAL PULSE:  Equal bilaterally.   ABDOMEN:  Soft, nontender.  Bowel sounds present and normal.  No audible abdominal bruit.    EXTREMITIES:   No increased edema present.  DP pulses palpable and equal bilaterally.   FEET:  Without lesions.     Assessment & Plan:  REOCCURRING ALLERGY/SINUS ISSUES.  Sees Dr [place] Callas.  Doing well now.     MSK.  Persistent knee pain.  Planning to see Dr Ernest Pine tomorrow.      INCREASED PSYCHOSOCIAL STRESSORS.  Doing well on Celexa.  Follow.   HEALTH MAINTENANCE.  Physical 03/14/12.  Mammogram 02/19/12 - BiRADS II.   She is s/p hysterectomy and does not require pap smears.

## 2012-10-25 ENCOUNTER — Encounter: Payer: Self-pay | Admitting: Internal Medicine

## 2012-10-25 DIAGNOSIS — M1710 Unilateral primary osteoarthritis, unspecified knee: Secondary | ICD-10-CM | POA: Diagnosis not present

## 2012-10-25 LAB — MICROALBUMIN / CREATININE URINE RATIO
Creatinine,U: 69.3 mg/dL
Microalb Creat Ratio: 0.9 mg/g (ref 0.0–30.0)
Microalb, Ur: 0.6 mg/dL (ref 0.0–1.9)

## 2012-10-25 NOTE — Assessment & Plan Note (Signed)
Low cholesterol diet and exercise.  On no medication.  Follow.

## 2012-10-25 NOTE — Assessment & Plan Note (Signed)
Blood pressure under good control.  Same medication regimen.  Check metabolic panel.    

## 2012-10-25 NOTE — Assessment & Plan Note (Signed)
Low carb diet and exercise.  Follow metabolic panel and a1c.  Keep up to date with eye exams.  A1c last checked - 6.6.  Follow.   

## 2012-10-25 NOTE — Assessment & Plan Note (Signed)
Sees Dr [place] Callas.  Doing well now.  Follow.

## 2012-10-26 ENCOUNTER — Encounter: Payer: Self-pay | Admitting: Internal Medicine

## 2012-10-27 ENCOUNTER — Ambulatory Visit: Payer: Self-pay | Admitting: General Surgery

## 2012-11-15 ENCOUNTER — Ambulatory Visit (INDEPENDENT_AMBULATORY_CARE_PROVIDER_SITE_OTHER): Payer: Medicare Other | Admitting: General Surgery

## 2012-11-15 ENCOUNTER — Other Ambulatory Visit: Payer: Self-pay | Admitting: *Deleted

## 2012-11-15 ENCOUNTER — Encounter: Payer: Self-pay | Admitting: General Surgery

## 2012-11-15 VITALS — BP 114/64 | HR 64 | Resp 14 | Ht 64.5 in | Wt 205.0 lb

## 2012-11-15 DIAGNOSIS — I8390 Asymptomatic varicose veins of unspecified lower extremity: Secondary | ICD-10-CM | POA: Insufficient documentation

## 2012-11-15 DIAGNOSIS — Z860100 Personal history of colon polyps, unspecified: Secondary | ICD-10-CM | POA: Insufficient documentation

## 2012-11-15 DIAGNOSIS — I8393 Asymptomatic varicose veins of bilateral lower extremities: Secondary | ICD-10-CM

## 2012-11-15 MED ORDER — POLYETHYLENE GLYCOL 3350 17 GM/SCOOP PO POWD: ORAL | Status: DC

## 2012-11-15 NOTE — Progress Notes (Signed)
Patient has been scheduled for a colonoscopy on 11-29-12 at [facility]. This patient has been asked to discontinue fish oil one week prior to procedure.

## 2012-11-15 NOTE — Patient Instructions (Addendum)
Patient advised to continue use of compression hose. Patient to be scheduled for colonoscopy.  Colonoscopy A colonoscopy is an exam to evaluate your entire colon. In this exam, your colon is cleansed. A long fiberoptic tube is inserted through your rectum and into your colon. The fiberoptic scope (endoscope) is a long bundle of enclosed and very flexible fibers. These fibers transmit light to the area examined and send images from that area to your caregiver. Discomfort is usually minimal. You may be given a drug to help you sleep (sedative) during or prior to the procedure. This exam helps to detect lumps (tumors), polyps, inflammation, and areas of bleeding. Your caregiver may also take a small piece of tissue (biopsy) that will be examined under a microscope. LET YOUR CAREGIVER KNOW ABOUT:   Allergies to food or medicine.  Medicines taken, including vitamins, herbs, eyedrops, over-the-counter medicines, and creams.  Use of steroids (by mouth or creams).  Previous problems with anesthetics or numbing medicines.  History of bleeding problems or blood clots.  Previous surgery.  Other health problems, including diabetes and kidney problems.  Possibility of pregnancy, if this applies. BEFORE THE PROCEDURE   A clear liquid diet may be required for 2 days before the exam.  Ask your caregiver about changing or stopping your regular medications.  Liquid injections (enemas) or laxatives may be required.  A large amount of electrolyte solution may be given to you to drink over a short period of time. This solution is used to clean out your colon.  You should be present 60 minutes prior to your procedure or as directed by your caregiver. AFTER THE PROCEDURE   If you received a sedative or pain relieving medication, you will need to arrange for someone to drive you home.  Occasionally, there is a little blood passed with the first bowel movement. Do not be concerned. FINDING OUT THE  RESULTS OF YOUR TEST Not all test results are available during your visit. If your test results are not back during the visit, make an appointment with your caregiver to find out the results. Do not assume everything is normal if you have not heard from your caregiver or the medical facility. It is important for you to follow up on all of your test results. HOME CARE INSTRUCTIONS   It is not unusual to pass moderate amounts of gas and experience mild abdominal cramping following the procedure. This is due to air being used to inflate your colon during the exam. Walking or a warm pack on your belly (abdomen) may help.  You may resume all normal meals and activities after sedatives and medicines have worn off.  Only take over-the-counter or prescription medicines for pain, discomfort, or fever as directed by your caregiver. Do not use aspirin or blood thinners if a biopsy was taken. Consult your caregiver for medicine usage if biopsies were taken. SEEK IMMEDIATE MEDICAL CARE IF:   You have a fever.  You pass large blood clots or fill a toilet with blood following the procedure. This may also occur 10 to 14 days following the procedure. This is more likely if a biopsy was taken.  You develop abdominal pain that keeps getting worse and cannot be relieved with medicine. Document Released: 02/28/2000 Document Revised: 05/25/2011 Document Reviewed: 10/13/2007 [facility] Patient Information 2014 [place], [place].

## 2012-11-15 NOTE — Progress Notes (Signed)
Patient ID: Carrie Mills, female   DOB: 1940-12-19, 72 y.o.   MRN: [id number]  Chief Complaint  Patient presents with  . Varicose Veins    one year followup    HPI Carrie Mills is a 72 y.o. female who presents for a 1 year follow up for varicose veins. No new problems with her legs.   HPI  Past Medical History  Diagnosis Date  . Allergy   . Hyperlipidemia   . Colon polyps   . Phlebitis   . Hyperglycemia   . Hypertension   . Varicose veins     Past Surgical History  Procedure Laterality Date  . Appendectomy  1987  . Total abdominal hysterectomy w/ bilateral salpingoophorectomy  1987  . Abdominal hysterectomy  1987  . Thrombectomy    . Colectomy  2004  . Colonoscopy  2009    Family History  Problem Relation Age of Onset  . Arthritis Mother   . Heart disease Mother     s/p CABG  . Hypertension Mother   . Heart disease Father   . Hypertension Father   . Diabetes Father   . Breast cancer Neg Hx   . Colon cancer Neg Hx     Social History History  Substance Use Topics  . Smoking status: Never Smoker   . Smokeless tobacco: Never Used  . Alcohol Use: No    Allergies  Allergen Reactions  . Sulfa Antibiotics Hives and Other (See Comments)    Fainting    Current Outpatient Prescriptions  Medication Sig Dispense Refill  . budesonide-formoterol (SYMBICORT) 160-4.5 MCG/ACT inhaler Inhale 2 puffs into the lungs daily.      . Calcium Carbonate Antacid 400 MG CHEW Chew by mouth daily.      . citalopram (CELEXA) 10 MG tablet Take 1 tablet (10 mg total) by mouth daily.  90 tablet  3  . Coconut Oil 1000 MG CAPS Take by mouth.      . fluticasone (FLONASE) 50 MCG/ACT nasal spray       . Glucosamine HCl 1000 MG TABS Take by mouth.      Marland Kitchen glucose blood (ACCU-CHEK AVIVA) test strip Test blood sugar once a day or as instructed. DX 250.0  100 each  5  . hydrochlorothiazide (HYDRODIURIL) 25 MG tablet Take 1 tablet (25 mg total) by mouth daily.  90 tablet  3  .  metoprolol succinate (TOPROL-XL) 25 MG 24 hr tablet Take 1 tablet (25 mg total) by mouth 2 (two) times daily.  180 tablet  3  . mometasone (ELOCON) 0.1 % lotion Use as directed  60 mL  0  . Montelukast Sodium (SINGULAIR PO) Take by mouth daily.      . Multiple Vitamin (MULTIVITAMIN) capsule Take 1 capsule by mouth daily.      . Omega-3 Fatty Acids (FISH OIL) 1200 MG CAPS Take by mouth daily.      . pramoxine-hydrocortisone (ANALPRAM-HC) 1-1 % rectal cream Place rectally 2 (two) times daily.  30 Mills  0  . pravastatin (PRAVACHOL) 40 MG tablet Take 1 tablet (40 mg total) by mouth daily.  90 tablet  3  . sodium chloride (OCEAN) 0.65 % nasal spray Place 1 spray into the nose as needed.      . triamcinolone (NASACORT) 55 MCG/ACT nasal inhaler Place 2 sprays into the nose daily.       No current facility-administered medications for this visit.    Review of Systems Review of Systems  Constitutional: Negative.   Respiratory: Negative.   Cardiovascular: Negative.     Blood pressure 114/64, pulse 64, resp. rate 14, height 5' 4.5" (1.638 m), weight 205 lb (92.987 kg).  Physical Exam Physical Exam  Constitutional: She is oriented to person, place, and time. She appears well-developed and well-nourished.  Eyes: Conjunctivae are normal. No scleral icterus.  Cardiovascular: Normal rate, regular rhythm and normal heart sounds.   No murmur heard. Pulses:      Dorsalis pedis pulses are 2+ on the right side, and 2+ on the left side.       Posterior tibial pulses are 2+ on the right side, and 2+ on the left side.  Scant edema in left leg. Varicose veins in both legs. More in the left than the right unchanged from before. Healed stasis change in inner aspect of left ankle.   Pulmonary/Chest: Effort normal and breath sounds normal.  Abdominal: Soft. Normal appearance and bowel sounds are normal. There is no hepatosplenomegaly. There is no tenderness. No hernia.  Neurological: She is alert and oriented to  person, place, and time.  Skin: Skin is warm and dry.    Data Reviewed None  Assessment    Stable varicose veins lower extremities.    Plan    Continue with the use of compression hose. Patient is due for colonoscopy. History of colon polyps.        Carrie Mills 11/15/2012, 10:09 AM

## 2012-11-16 ENCOUNTER — Other Ambulatory Visit: Payer: Self-pay | Admitting: *Deleted

## 2012-11-16 MED ORDER — PRAVASTATIN SODIUM 40 MG PO TABS: 40.0000 mg | ORAL_TABLET | Freq: Every day | ORAL | Status: DC

## 2012-11-21 ENCOUNTER — Other Ambulatory Visit: Payer: Self-pay | Admitting: General Surgery

## 2012-11-21 DIAGNOSIS — Z86010 Personal history of colonic polyps: Secondary | ICD-10-CM

## 2012-11-28 ENCOUNTER — Telehealth: Payer: Self-pay | Admitting: *Deleted

## 2012-11-28 NOTE — Telephone Encounter (Signed)
Patient called to reschedule colonoscopy from 11-29-12 to 12-27-12 at [facility]. Her sister-in-law is in the hospital and needs to be with her at this time. Trish in endoscopy notified of reschedule.

## 2012-12-27 ENCOUNTER — Ambulatory Visit: Payer: Self-pay | Admitting: General Surgery

## 2012-12-27 DIAGNOSIS — Z860100 Personal history of colon polyps, unspecified: Secondary | ICD-10-CM | POA: Diagnosis not present

## 2012-12-27 DIAGNOSIS — Z09 Encounter for follow-up examination after completed treatment for conditions other than malignant neoplasm: Secondary | ICD-10-CM | POA: Diagnosis not present

## 2012-12-27 DIAGNOSIS — Z882 Allergy status to sulfonamides status: Secondary | ICD-10-CM | POA: Diagnosis not present

## 2012-12-27 DIAGNOSIS — Z8249 Family history of ischemic heart disease and other diseases of the circulatory system: Secondary | ICD-10-CM | POA: Diagnosis not present

## 2012-12-27 DIAGNOSIS — E785 Hyperlipidemia, unspecified: Secondary | ICD-10-CM | POA: Diagnosis not present

## 2012-12-27 DIAGNOSIS — I10 Essential (primary) hypertension: Secondary | ICD-10-CM | POA: Diagnosis not present

## 2012-12-27 HISTORY — PX: COLONOSCOPY: SHX174

## 2012-12-27 LAB — HM COLONOSCOPY: HM Colonoscopy: NORMAL

## 2012-12-28 ENCOUNTER — Encounter: Payer: Self-pay | Admitting: General Surgery

## 2013-01-12 DIAGNOSIS — Z23 Encounter for immunization: Secondary | ICD-10-CM | POA: Diagnosis not present

## 2013-01-16 DIAGNOSIS — M25569 Pain in unspecified knee: Secondary | ICD-10-CM | POA: Diagnosis not present

## 2013-01-16 DIAGNOSIS — M549 Dorsalgia, unspecified: Secondary | ICD-10-CM | POA: Diagnosis not present

## 2013-01-16 DIAGNOSIS — M5380 Other specified dorsopathies, site unspecified: Secondary | ICD-10-CM | POA: Diagnosis not present

## 2013-01-16 DIAGNOSIS — M999 Biomechanical lesion, unspecified: Secondary | ICD-10-CM | POA: Diagnosis not present

## 2013-01-16 DIAGNOSIS — M5137 Other intervertebral disc degeneration, lumbosacral region: Secondary | ICD-10-CM | POA: Diagnosis not present

## 2013-01-19 ENCOUNTER — Other Ambulatory Visit: Payer: Self-pay

## 2013-02-01 DIAGNOSIS — L723 Sebaceous cyst: Secondary | ICD-10-CM | POA: Diagnosis not present

## 2013-02-01 DIAGNOSIS — Z85828 Personal history of other malignant neoplasm of skin: Secondary | ICD-10-CM | POA: Diagnosis not present

## 2013-02-01 DIAGNOSIS — D235 Other benign neoplasm of skin of trunk: Secondary | ICD-10-CM | POA: Diagnosis not present

## 2013-03-21 ENCOUNTER — Other Ambulatory Visit: Payer: Self-pay | Admitting: Internal Medicine

## 2013-03-22 DIAGNOSIS — M25569 Pain in unspecified knee: Secondary | ICD-10-CM | POA: Diagnosis not present

## 2013-03-22 DIAGNOSIS — M5137 Other intervertebral disc degeneration, lumbosacral region: Secondary | ICD-10-CM | POA: Diagnosis not present

## 2013-03-22 DIAGNOSIS — M5380 Other specified dorsopathies, site unspecified: Secondary | ICD-10-CM | POA: Diagnosis not present

## 2013-03-22 DIAGNOSIS — M999 Biomechanical lesion, unspecified: Secondary | ICD-10-CM | POA: Diagnosis not present

## 2013-03-22 DIAGNOSIS — M549 Dorsalgia, unspecified: Secondary | ICD-10-CM | POA: Diagnosis not present

## 2013-03-24 ENCOUNTER — Other Ambulatory Visit: Payer: Self-pay | Admitting: Internal Medicine

## 2013-03-27 ENCOUNTER — Ambulatory Visit (INDEPENDENT_AMBULATORY_CARE_PROVIDER_SITE_OTHER): Payer: Medicare Other | Admitting: Internal Medicine

## 2013-03-27 ENCOUNTER — Encounter: Payer: Self-pay | Admitting: Internal Medicine

## 2013-03-27 ENCOUNTER — Other Ambulatory Visit: Payer: Self-pay | Admitting: Internal Medicine

## 2013-03-27 VITALS — BP 140/70 | HR 68 | Temp 98.3°F | Ht 65.25 in | Wt 204.2 lb

## 2013-03-27 DIAGNOSIS — Z1239 Encounter for other screening for malignant neoplasm of breast: Secondary | ICD-10-CM

## 2013-03-27 DIAGNOSIS — E7800 Pure hypercholesterolemia, unspecified: Secondary | ICD-10-CM

## 2013-03-27 DIAGNOSIS — I8390 Asymptomatic varicose veins of unspecified lower extremity: Secondary | ICD-10-CM

## 2013-03-27 DIAGNOSIS — I10 Essential (primary) hypertension: Secondary | ICD-10-CM

## 2013-03-27 DIAGNOSIS — E119 Type 2 diabetes mellitus without complications: Secondary | ICD-10-CM | POA: Diagnosis not present

## 2013-03-27 DIAGNOSIS — Z86010 Personal history of colonic polyps: Secondary | ICD-10-CM

## 2013-03-27 DIAGNOSIS — Z9109 Other allergy status, other than to drugs and biological substances: Secondary | ICD-10-CM

## 2013-03-27 DIAGNOSIS — J069 Acute upper respiratory infection, unspecified: Secondary | ICD-10-CM

## 2013-03-27 LAB — BASIC METABOLIC PANEL
BUN: 16 mg/dL (ref 6–23)
CALCIUM: 9.9 mg/dL (ref 8.4–10.5)
CO2: 30 mEq/L (ref 19–32)
Chloride: 105 mEq/L (ref 96–112)
Creatinine, Ser: 1 mg/dL (ref 0.4–1.2)
GFR: 60.36 mL/min (ref >60.00)
Glucose, Bld: 108 mg/dL — ABNORMAL HIGH (ref 70–99)
POTASSIUM: 4.5 meq/L (ref 3.5–5.1)
SODIUM: 146 meq/L — AB (ref 135–145)

## 2013-03-27 LAB — LIPID PANEL
CHOL/HDL RATIO: 5
Cholesterol: 188 mg/dL (ref 0–200)
HDL: 39 mg/dL — ABNORMAL LOW (ref >39.00)
LDL Cholesterol: 112 mg/dL — ABNORMAL HIGH (ref 0–99)
Triglycerides: 187 mg/dL — ABNORMAL HIGH (ref 0.0–149.0)
VLDL: 37.4 mg/dL (ref 0.0–40.0)

## 2013-03-27 LAB — HEPATIC FUNCTION PANEL
ALK PHOS: 51 U/L (ref 39–117)
ALT: 29 U/L (ref 0–35)
AST: 29 U/L (ref 0–37)
Albumin: 4.1 g/dL (ref 3.5–5.2)
BILIRUBIN DIRECT: 0.2 mg/dL (ref 0.0–0.3)
BILIRUBIN TOTAL: 1.1 mg/dL (ref 0.3–1.2)
Total Protein: 6.9 g/dL (ref 6.0–8.3)

## 2013-03-27 LAB — HEMOGLOBIN A1C: Hgb A1c MFr Bld: 6.8 % — ABNORMAL HIGH (ref 4.6–6.5)

## 2013-03-27 MED ORDER — AMOXICILLIN 875 MG PO TABS: 875.0000 mg | ORAL_TABLET | Freq: Two times a day (BID) | ORAL | Status: DC

## 2013-03-27 NOTE — Telephone Encounter (Signed)
Hold for appt today

## 2013-03-27 NOTE — Progress Notes (Signed)
Orders placed for f/u labs.  

## 2013-03-27 NOTE — Progress Notes (Signed)
Subjective:    Patient ID: Carrie Mills, female    DOB: 1940/10/30, 73 y.o.   MRN: [id number]  HPI 73 year old female with past history of hypertension, hypercholesterolemia and hyperglycemia who comes in today to follow up on these issues as well as for a complete physical exam.  States she has been doing well.  Feels she is handling stress relatively well.  Has a lot of stress with her sons medical issues.   States sugars doing ok.    Breathing is stable.  She does report that starting 03/07/13 she developed some congestion.  No significant drainage.  Increased cough. Some sinus pressure.  Previous fever.  No chest tightness.  No wheezing.  Green mucus production.   No nausea or vomiting.  No cardiac symptoms with increased activity or exertion.  Is riding her bike.   No chest pain or tightness with increased activity or exertion.   Her knee limits her.   Sees Dr Carrie Mills.      Past Medical History  Diagnosis Date  . Allergy   . Hyperlipidemia   . Colon polyps   . Phlebitis   . Hyperglycemia   . Hypertension   . Varicose veins      Current Outpatient Prescriptions on File Prior to Visit  Medication Sig Dispense Refill  . budesonide-formoterol (SYMBICORT) 160-4.5 MCG/ACT inhaler Inhale 2 puffs into the lungs daily.      . Calcium Carbonate Antacid 400 MG CHEW Chew by mouth daily.      . Coconut Oil 1000 MG CAPS Take by mouth.      . fluticasone (FLONASE) 50 MCG/ACT nasal spray       . Glucosamine HCl 1000 MG TABS Take by mouth.      Marland Kitchen glucose blood (ACCU-CHEK AVIVA) test strip Test blood sugar once a day or as instructed. DX 250.0  100 each  5  . hydrochlorothiazide (HYDRODIURIL) 25 MG tablet TAKE 1 TABLET DAILY  90 tablet  1  . metoprolol succinate (TOPROL-XL) 25 MG 24 hr tablet TAKE 1 TABLET TWICE A DAY  180 tablet  1  . mometasone (ELOCON) 0.1 % lotion Use as directed  60 mL  0  . Montelukast Sodium (SINGULAIR PO) Take by mouth daily.      . Multiple Vitamin (MULTIVITAMIN)  capsule Take 1 capsule by mouth daily.      . Omega-3 Fatty Acids (FISH OIL) 1200 MG CAPS Take by mouth daily.      . pramoxine-hydrocortisone (ANALPRAM-HC) 1-1 % rectal cream Place rectally 2 (two) times daily.  30 g  0  . pravastatin (PRAVACHOL) 40 MG tablet Take 1 tablet (40 mg total) by mouth daily.  90 tablet  1  . sodium chloride (OCEAN) 0.65 % nasal spray Place 1 spray into the nose as needed.      . triamcinolone (NASACORT) 55 MCG/ACT nasal inhaler Place 2 sprays into the nose daily.      . citalopram (CELEXA) 10 MG tablet TAKE 1 TABLET DAILY  90 tablet  1   No current facility-administered medications on file prior to visit.     Review of Systems Patient denies any headache, lightheadedness or dizziness.  No chest pain, tightness or palpitations.  Some congestion and cough as outlined.  Green mucus production.   No cardiac symptoms with increased activity or exertion.   No increased shortness of breath.  No nausea or vomiting.  No abdominal pain or cramping. No acid reflux.  No bowel  change, such as diarrhea, constipation, BRBPR or melana.   No urine change.  Persistent knee issues.   Seeing ortho.       Objective:   Physical Exam  Filed Vitals:   03/27/13 1042  BP: 140/70  Pulse: 68  Temp: 98.3 F (36.8 C)   Blood pressure recheck:  128/76, pulse 34  73 year old female in no acute distress.   HEENT:  Nares- slightly erythematous turbinates.  Oropharynx - without lesions.  TMs without erythema.  No significant sinus tenderness to palpation.   NECK:  Supple.  Nontender.  No audible bruit.  HEART:  Appears to be regular. LUNGS:  No crackles or wheezing audible.  Respirations even and unlabored.  RADIAL PULSE:  Equal bilaterally.    BREASTS:  No nipple discharge or nipple retraction present.  Could not appreciate any distinct nodules or axillary adenopathy.  ABDOMEN:  Soft, nontender.  Bowel sounds present and normal.  No audible abdominal bruit.  GU:  Not performed.     EXTREMITIES:  No increased edema present.  DP pulses palpable and equal bilaterally.      FEET:  No lesions.    Assessment & Plan:  REOCCURRING ALLERGY/SINUS ISSUES.  Sees Dr Carrie Mills.  Treat current infection as outlined.     MSK.  Followed by ortho.      INCREASED PSYCHOSOCIAL STRESSORS.  Doing well on Celexa.  Follow.   HEALTH MAINTENANCE.  Physical today.  Mammogram 02/19/12 - BiRADS II.   Schedule mammogram.  She is s/p hysterectomy and does not require pap smears.

## 2013-03-27 NOTE — Progress Notes (Signed)
Pre-visit discussion using our clinic review tool. No additional management support is needed unless otherwise documented below in the visit note.m

## 2013-03-28 ENCOUNTER — Other Ambulatory Visit: Payer: Self-pay | Admitting: Internal Medicine

## 2013-03-28 ENCOUNTER — Encounter: Payer: Self-pay | Admitting: *Deleted

## 2013-03-28 DIAGNOSIS — I10 Essential (primary) hypertension: Secondary | ICD-10-CM

## 2013-03-28 DIAGNOSIS — E119 Type 2 diabetes mellitus without complications: Secondary | ICD-10-CM

## 2013-03-28 DIAGNOSIS — E7800 Pure hypercholesterolemia, unspecified: Secondary | ICD-10-CM

## 2013-03-28 NOTE — Progress Notes (Signed)
Orders placed for labs

## 2013-03-29 ENCOUNTER — Encounter: Payer: Self-pay | Admitting: Internal Medicine

## 2013-03-29 DIAGNOSIS — J069 Acute upper respiratory infection, unspecified: Secondary | ICD-10-CM | POA: Insufficient documentation

## 2013-03-29 NOTE — Assessment & Plan Note (Signed)
Followed by Dr Sankar.  Stable.  Support hose.    

## 2013-03-29 NOTE — Assessment & Plan Note (Addendum)
Symptoms and exam as outlined.  Persistent.  Treat with amoxicillin as directed.  Flonase and saline nasal spray as directed.  Mucinex in the am and robitussin in the pm.

## 2013-03-29 NOTE — Assessment & Plan Note (Signed)
Sees Dr Sharma.   Follow.   

## 2013-03-29 NOTE — Assessment & Plan Note (Signed)
Colonoscopy 12/27/12 - normal.  Recommend f/u colonoscopy five years.    

## 2013-03-29 NOTE — Assessment & Plan Note (Signed)
Blood pressure under good control.  Same medication regimen.  Follow metabolic panel.   

## 2013-03-29 NOTE — Assessment & Plan Note (Signed)
Low cholesterol diet and exercise.  On no medication.  Follow.  Triglycerides increased.  Low carb diet.

## 2013-03-29 NOTE — Assessment & Plan Note (Addendum)
Low carb diet and exercise.  Follow metabolic panel and B5C.  Keep up to date with eye exams.  A1c last checked - 6.8.  Follow.

## 2013-04-11 DIAGNOSIS — IMO0002 Reserved for concepts with insufficient information to code with codable children: Secondary | ICD-10-CM | POA: Diagnosis not present

## 2013-04-11 DIAGNOSIS — M1710 Unilateral primary osteoarthritis, unspecified knee: Secondary | ICD-10-CM | POA: Diagnosis not present

## 2013-04-29 ENCOUNTER — Other Ambulatory Visit: Payer: Self-pay | Admitting: Internal Medicine

## 2013-05-18 DIAGNOSIS — M25569 Pain in unspecified knee: Secondary | ICD-10-CM | POA: Diagnosis not present

## 2013-05-18 DIAGNOSIS — J309 Allergic rhinitis, unspecified: Secondary | ICD-10-CM | POA: Diagnosis not present

## 2013-05-18 DIAGNOSIS — J3089 Other allergic rhinitis: Secondary | ICD-10-CM | POA: Diagnosis not present

## 2013-05-18 DIAGNOSIS — J45909 Unspecified asthma, uncomplicated: Secondary | ICD-10-CM | POA: Diagnosis not present

## 2013-05-18 DIAGNOSIS — M5380 Other specified dorsopathies, site unspecified: Secondary | ICD-10-CM | POA: Diagnosis not present

## 2013-05-18 DIAGNOSIS — M5137 Other intervertebral disc degeneration, lumbosacral region: Secondary | ICD-10-CM | POA: Diagnosis not present

## 2013-05-18 DIAGNOSIS — M999 Biomechanical lesion, unspecified: Secondary | ICD-10-CM | POA: Diagnosis not present

## 2013-05-18 DIAGNOSIS — M549 Dorsalgia, unspecified: Secondary | ICD-10-CM | POA: Diagnosis not present

## 2013-05-23 ENCOUNTER — Other Ambulatory Visit: Payer: Self-pay | Admitting: Internal Medicine

## 2013-05-24 ENCOUNTER — Ambulatory Visit: Payer: Self-pay | Admitting: General Practice

## 2013-05-24 DIAGNOSIS — IMO0002 Reserved for concepts with insufficient information to code with codable children: Secondary | ICD-10-CM | POA: Diagnosis not present

## 2013-05-24 DIAGNOSIS — D62 Acute posthemorrhagic anemia: Secondary | ICD-10-CM | POA: Diagnosis not present

## 2013-05-24 DIAGNOSIS — Z01812 Encounter for preprocedural laboratory examination: Secondary | ICD-10-CM | POA: Diagnosis not present

## 2013-05-24 DIAGNOSIS — I4949 Other premature depolarization: Secondary | ICD-10-CM | POA: Diagnosis not present

## 2013-05-24 DIAGNOSIS — I10 Essential (primary) hypertension: Secondary | ICD-10-CM | POA: Diagnosis not present

## 2013-05-24 DIAGNOSIS — Z01810 Encounter for preprocedural cardiovascular examination: Secondary | ICD-10-CM | POA: Diagnosis not present

## 2013-05-24 DIAGNOSIS — Z79899 Other long term (current) drug therapy: Secondary | ICD-10-CM | POA: Diagnosis not present

## 2013-05-24 DIAGNOSIS — E119 Type 2 diabetes mellitus without complications: Secondary | ICD-10-CM | POA: Diagnosis not present

## 2013-05-24 LAB — URINALYSIS, COMPLETE
BACTERIA: NONE SEEN
Bilirubin,UR: NEGATIVE
Blood: NEGATIVE
Glucose,UR: NEGATIVE mg/dL (ref 0–75)
KETONE: NEGATIVE
Nitrite: NEGATIVE
PH: 6 (ref 4.5–8.0)
PROTEIN: NEGATIVE
SPECIFIC GRAVITY: 1.015 (ref 1.003–1.030)
Squamous Epithelial: <1
WBC UR: 5 /HPF (ref 0–5)

## 2013-05-24 LAB — CBC
HCT: 38.9 % (ref 35.0–47.0)
HGB: 13.6 g/dL (ref 12.0–16.0)
MCH: 31.2 pg (ref 26.0–34.0)
MCHC: 35 g/dL (ref 32.0–36.0)
MCV: 89 fL (ref 80–100)
Platelet: 242 10*3/uL (ref 150–440)
RBC: 4.35 10*6/uL (ref 3.80–5.20)
RDW: 13.5 % (ref 11.5–14.5)
WBC: 9 10*3/uL (ref 3.6–11.0)

## 2013-05-24 LAB — BASIC METABOLIC PANEL
Anion Gap: 3 — ABNORMAL LOW (ref 7–16)
BUN: 15 mg/dL (ref 7–18)
CHLORIDE: 104 mmol/L (ref 98–107)
Calcium, Total: 9.4 mg/dL (ref 8.5–10.1)
Co2: 30 mmol/L (ref 21–32)
Creatinine: 0.89 mg/dL (ref 0.60–1.30)
EGFR (African American): >60
EGFR (Non-African Amer.): >60
Glucose: 130 mg/dL — ABNORMAL HIGH (ref 65–99)
Osmolality: 276 (ref 275–301)
Potassium: 3.8 mmol/L (ref 3.5–5.1)
Sodium: 137 mmol/L (ref 136–145)

## 2013-05-24 LAB — PROTIME-INR
INR: 0.9
PROTHROMBIN TIME: 12.4 s (ref 11.5–14.7)

## 2013-05-24 LAB — APTT: Activated PTT: 27.9 secs (ref 23.6–35.9)

## 2013-05-24 LAB — SEDIMENTATION RATE: Erythrocyte Sed Rate: 17 mm/hr (ref 0–30)

## 2013-05-24 LAB — MRSA PCR SCREENING

## 2013-05-25 ENCOUNTER — Encounter: Payer: Self-pay | Admitting: General Surgery

## 2013-05-25 ENCOUNTER — Ambulatory Visit (INDEPENDENT_AMBULATORY_CARE_PROVIDER_SITE_OTHER): Payer: Medicare Other | Admitting: General Surgery

## 2013-05-25 VITALS — BP 128/64 | HR 74 | Resp 16 | Ht 65.0 in | Wt 207.0 lb

## 2013-05-25 DIAGNOSIS — I83893 Varicose veins of bilateral lower extremities with other complications: Secondary | ICD-10-CM | POA: Diagnosis not present

## 2013-05-25 LAB — URINE CULTURE

## 2013-05-25 NOTE — Patient Instructions (Signed)
Patient to return as scheduled. The patient is aware to call back for any questions or concerns.  

## 2013-05-25 NOTE — Progress Notes (Signed)
Patient ID: Carrie Mills, female   DOB: 22-Nov-1940, 73 y.o.   MRN: [id number]   The patient presents for an evaluation of varicose veins. She is having knee surgery and needs clearance from Dr. Jamal Collin.   Pt denies any symptoms with her VV.  She has had only one episode of superficial phlebitis some 50 yrs ago. Never had DVT. Prior Duplex scan showed no deep venous pathology. She has VV on both legs and has had no symptoms. She uses compression hose daily and has had excellent control for many yrs now. Do not feel she needs any other preventive measures for her left TKR. Usual DVT prophylaxis is all that is needed. Advised pt to start using her compression hose after she is discharged.

## 2013-06-05 ENCOUNTER — Inpatient Hospital Stay: Payer: Self-pay | Admitting: General Practice

## 2013-06-05 DIAGNOSIS — M1710 Unilateral primary osteoarthritis, unspecified knee: Secondary | ICD-10-CM | POA: Diagnosis not present

## 2013-06-05 DIAGNOSIS — Z96659 Presence of unspecified artificial knee joint: Secondary | ICD-10-CM | POA: Diagnosis not present

## 2013-06-05 DIAGNOSIS — D62 Acute posthemorrhagic anemia: Secondary | ICD-10-CM | POA: Diagnosis present

## 2013-06-05 DIAGNOSIS — I4949 Other premature depolarization: Secondary | ICD-10-CM | POA: Diagnosis present

## 2013-06-05 DIAGNOSIS — I10 Essential (primary) hypertension: Secondary | ICD-10-CM | POA: Diagnosis not present

## 2013-06-05 DIAGNOSIS — Z471 Aftercare following joint replacement surgery: Secondary | ICD-10-CM | POA: Diagnosis not present

## 2013-06-05 DIAGNOSIS — M25569 Pain in unspecified knee: Secondary | ICD-10-CM | POA: Diagnosis not present

## 2013-06-05 DIAGNOSIS — M6281 Muscle weakness (generalized): Secondary | ICD-10-CM | POA: Diagnosis not present

## 2013-06-05 DIAGNOSIS — I809 Phlebitis and thrombophlebitis of unspecified site: Secondary | ICD-10-CM | POA: Diagnosis present

## 2013-06-05 DIAGNOSIS — J309 Allergic rhinitis, unspecified: Secondary | ICD-10-CM | POA: Diagnosis not present

## 2013-06-05 DIAGNOSIS — Z5189 Encounter for other specified aftercare: Secondary | ICD-10-CM | POA: Diagnosis not present

## 2013-06-05 DIAGNOSIS — J45909 Unspecified asthma, uncomplicated: Secondary | ICD-10-CM | POA: Diagnosis not present

## 2013-06-05 DIAGNOSIS — E119 Type 2 diabetes mellitus without complications: Secondary | ICD-10-CM | POA: Diagnosis not present

## 2013-06-05 DIAGNOSIS — R269 Unspecified abnormalities of gait and mobility: Secondary | ICD-10-CM | POA: Diagnosis not present

## 2013-06-05 DIAGNOSIS — E785 Hyperlipidemia, unspecified: Secondary | ICD-10-CM | POA: Diagnosis present

## 2013-06-05 DIAGNOSIS — IMO0002 Reserved for concepts with insufficient information to code with codable children: Secondary | ICD-10-CM | POA: Diagnosis not present

## 2013-06-05 HISTORY — PX: REPLACEMENT TOTAL KNEE: SUR1224

## 2013-06-06 LAB — BASIC METABOLIC PANEL
ANION GAP: 4 — AB (ref 7–16)
BUN: 10 mg/dL (ref 7–18)
CALCIUM: 7.9 mg/dL — AB (ref 8.5–10.1)
CREATININE: 0.89 mg/dL (ref 0.60–1.30)
Chloride: 99 mmol/L (ref 98–107)
Co2: 31 mmol/L (ref 21–32)
Glucose: 130 mg/dL — ABNORMAL HIGH (ref 65–99)
Osmolality: 269 (ref 275–301)
Potassium: 3.3 mmol/L — ABNORMAL LOW (ref 3.5–5.1)
Sodium: 134 mmol/L — ABNORMAL LOW (ref 136–145)

## 2013-06-06 LAB — PLATELET COUNT: PLATELETS: 180 10*3/uL (ref 150–440)

## 2013-06-06 LAB — HEMOGLOBIN: HGB: 10.6 g/dL — AB (ref 12.0–16.0)

## 2013-06-07 LAB — BASIC METABOLIC PANEL
Anion Gap: 3 — ABNORMAL LOW (ref 7–16)
BUN: 6 mg/dL — AB (ref 7–18)
CALCIUM: 8 mg/dL — AB (ref 8.5–10.1)
CHLORIDE: 99 mmol/L (ref 98–107)
Co2: 31 mmol/L (ref 21–32)
Creatinine: 0.87 mg/dL (ref 0.60–1.30)
EGFR (Non-African Amer.): >60
Glucose: 140 mg/dL — ABNORMAL HIGH (ref 65–99)
OSMOLALITY: 266 (ref 275–301)
Potassium: 3.5 mmol/L (ref 3.5–5.1)
Sodium: 133 mmol/L — ABNORMAL LOW (ref 136–145)

## 2013-06-07 LAB — PLATELET COUNT: PLATELETS: 171 10*3/uL (ref 150–440)

## 2013-06-07 LAB — HEMOGLOBIN: HGB: 10.2 g/dL — ABNORMAL LOW (ref 12.0–16.0)

## 2013-06-08 DIAGNOSIS — I10 Essential (primary) hypertension: Secondary | ICD-10-CM | POA: Diagnosis not present

## 2013-06-08 DIAGNOSIS — R269 Unspecified abnormalities of gait and mobility: Secondary | ICD-10-CM | POA: Diagnosis not present

## 2013-06-08 DIAGNOSIS — Z5189 Encounter for other specified aftercare: Secondary | ICD-10-CM | POA: Diagnosis not present

## 2013-06-08 DIAGNOSIS — E119 Type 2 diabetes mellitus without complications: Secondary | ICD-10-CM | POA: Diagnosis not present

## 2013-06-08 DIAGNOSIS — M1991 Primary osteoarthritis, unspecified site: Secondary | ICD-10-CM | POA: Diagnosis not present

## 2013-06-08 DIAGNOSIS — M25569 Pain in unspecified knee: Secondary | ICD-10-CM | POA: Diagnosis not present

## 2013-06-08 DIAGNOSIS — Z471 Aftercare following joint replacement surgery: Secondary | ICD-10-CM | POA: Diagnosis not present

## 2013-06-08 DIAGNOSIS — J309 Allergic rhinitis, unspecified: Secondary | ICD-10-CM | POA: Diagnosis not present

## 2013-06-08 DIAGNOSIS — N3090 Cystitis, unspecified without hematuria: Secondary | ICD-10-CM | POA: Diagnosis not present

## 2013-06-08 DIAGNOSIS — J45909 Unspecified asthma, uncomplicated: Secondary | ICD-10-CM | POA: Diagnosis not present

## 2013-06-08 DIAGNOSIS — Z96659 Presence of unspecified artificial knee joint: Secondary | ICD-10-CM | POA: Diagnosis not present

## 2013-06-08 DIAGNOSIS — M6281 Muscle weakness (generalized): Secondary | ICD-10-CM | POA: Diagnosis not present

## 2013-06-09 ENCOUNTER — Encounter: Payer: Self-pay | Admitting: Internal Medicine

## 2013-06-09 DIAGNOSIS — M1991 Primary osteoarthritis, unspecified site: Secondary | ICD-10-CM | POA: Diagnosis not present

## 2013-06-09 DIAGNOSIS — N3090 Cystitis, unspecified without hematuria: Secondary | ICD-10-CM | POA: Diagnosis not present

## 2013-06-09 DIAGNOSIS — E119 Type 2 diabetes mellitus without complications: Secondary | ICD-10-CM | POA: Diagnosis not present

## 2013-06-09 LAB — URINALYSIS, COMPLETE
BILIRUBIN, UR: NEGATIVE
Bacteria: NONE SEEN
Blood: NEGATIVE
Glucose,UR: NEGATIVE mg/dL (ref 0–75)
Ketone: NEGATIVE
Leukocyte Esterase: NEGATIVE
Nitrite: NEGATIVE
PH: 8 (ref 4.5–8.0)
Protein: NEGATIVE
Specific Gravity: 1.016 (ref 1.003–1.030)
Squamous Epithelial: NONE SEEN
Waxy Cast: 2

## 2013-06-10 LAB — URINE CULTURE

## 2013-06-14 ENCOUNTER — Encounter: Payer: Self-pay | Admitting: Internal Medicine

## 2013-06-20 DIAGNOSIS — M25669 Stiffness of unspecified knee, not elsewhere classified: Secondary | ICD-10-CM | POA: Diagnosis not present

## 2013-06-20 DIAGNOSIS — Z96659 Presence of unspecified artificial knee joint: Secondary | ICD-10-CM | POA: Diagnosis not present

## 2013-06-20 DIAGNOSIS — M25569 Pain in unspecified knee: Secondary | ICD-10-CM | POA: Diagnosis not present

## 2013-06-20 DIAGNOSIS — M6281 Muscle weakness (generalized): Secondary | ICD-10-CM | POA: Diagnosis not present

## 2013-06-22 DIAGNOSIS — M25669 Stiffness of unspecified knee, not elsewhere classified: Secondary | ICD-10-CM | POA: Diagnosis not present

## 2013-06-22 DIAGNOSIS — Z96659 Presence of unspecified artificial knee joint: Secondary | ICD-10-CM | POA: Diagnosis not present

## 2013-06-22 DIAGNOSIS — M25569 Pain in unspecified knee: Secondary | ICD-10-CM | POA: Diagnosis not present

## 2013-06-22 DIAGNOSIS — M6281 Muscle weakness (generalized): Secondary | ICD-10-CM | POA: Diagnosis not present

## 2013-06-26 DIAGNOSIS — M25569 Pain in unspecified knee: Secondary | ICD-10-CM | POA: Diagnosis not present

## 2013-06-26 DIAGNOSIS — M25669 Stiffness of unspecified knee, not elsewhere classified: Secondary | ICD-10-CM | POA: Diagnosis not present

## 2013-06-26 DIAGNOSIS — M6281 Muscle weakness (generalized): Secondary | ICD-10-CM | POA: Diagnosis not present

## 2013-06-26 DIAGNOSIS — Z96659 Presence of unspecified artificial knee joint: Secondary | ICD-10-CM | POA: Diagnosis not present

## 2013-06-28 DIAGNOSIS — M6281 Muscle weakness (generalized): Secondary | ICD-10-CM | POA: Diagnosis not present

## 2013-06-28 DIAGNOSIS — Z96659 Presence of unspecified artificial knee joint: Secondary | ICD-10-CM | POA: Diagnosis not present

## 2013-06-28 DIAGNOSIS — M25569 Pain in unspecified knee: Secondary | ICD-10-CM | POA: Diagnosis not present

## 2013-06-28 DIAGNOSIS — M25669 Stiffness of unspecified knee, not elsewhere classified: Secondary | ICD-10-CM | POA: Diagnosis not present

## 2013-06-29 ENCOUNTER — Encounter: Payer: Self-pay | Admitting: Internal Medicine

## 2013-06-30 DIAGNOSIS — M25669 Stiffness of unspecified knee, not elsewhere classified: Secondary | ICD-10-CM | POA: Diagnosis not present

## 2013-06-30 DIAGNOSIS — M6281 Muscle weakness (generalized): Secondary | ICD-10-CM | POA: Diagnosis not present

## 2013-06-30 DIAGNOSIS — M25569 Pain in unspecified knee: Secondary | ICD-10-CM | POA: Diagnosis not present

## 2013-06-30 DIAGNOSIS — Z96659 Presence of unspecified artificial knee joint: Secondary | ICD-10-CM | POA: Diagnosis not present

## 2013-07-03 DIAGNOSIS — M25669 Stiffness of unspecified knee, not elsewhere classified: Secondary | ICD-10-CM | POA: Diagnosis not present

## 2013-07-03 DIAGNOSIS — Z96659 Presence of unspecified artificial knee joint: Secondary | ICD-10-CM | POA: Diagnosis not present

## 2013-07-03 DIAGNOSIS — M6281 Muscle weakness (generalized): Secondary | ICD-10-CM | POA: Diagnosis not present

## 2013-07-03 DIAGNOSIS — M25569 Pain in unspecified knee: Secondary | ICD-10-CM | POA: Diagnosis not present

## 2013-07-06 DIAGNOSIS — Z96659 Presence of unspecified artificial knee joint: Secondary | ICD-10-CM | POA: Diagnosis not present

## 2013-07-06 DIAGNOSIS — M25569 Pain in unspecified knee: Secondary | ICD-10-CM | POA: Diagnosis not present

## 2013-07-06 DIAGNOSIS — M6281 Muscle weakness (generalized): Secondary | ICD-10-CM | POA: Diagnosis not present

## 2013-07-06 DIAGNOSIS — M25669 Stiffness of unspecified knee, not elsewhere classified: Secondary | ICD-10-CM | POA: Diagnosis not present

## 2013-07-10 DIAGNOSIS — M25569 Pain in unspecified knee: Secondary | ICD-10-CM | POA: Diagnosis not present

## 2013-07-10 DIAGNOSIS — M25669 Stiffness of unspecified knee, not elsewhere classified: Secondary | ICD-10-CM | POA: Diagnosis not present

## 2013-07-10 DIAGNOSIS — Z96659 Presence of unspecified artificial knee joint: Secondary | ICD-10-CM | POA: Diagnosis not present

## 2013-07-10 DIAGNOSIS — M6281 Muscle weakness (generalized): Secondary | ICD-10-CM | POA: Diagnosis not present

## 2013-07-13 DIAGNOSIS — M25669 Stiffness of unspecified knee, not elsewhere classified: Secondary | ICD-10-CM | POA: Diagnosis not present

## 2013-07-13 DIAGNOSIS — Z96659 Presence of unspecified artificial knee joint: Secondary | ICD-10-CM | POA: Diagnosis not present

## 2013-07-13 DIAGNOSIS — M25569 Pain in unspecified knee: Secondary | ICD-10-CM | POA: Diagnosis not present

## 2013-07-13 DIAGNOSIS — M6281 Muscle weakness (generalized): Secondary | ICD-10-CM | POA: Diagnosis not present

## 2013-07-17 DIAGNOSIS — M25569 Pain in unspecified knee: Secondary | ICD-10-CM | POA: Diagnosis not present

## 2013-07-17 DIAGNOSIS — Z96659 Presence of unspecified artificial knee joint: Secondary | ICD-10-CM | POA: Diagnosis not present

## 2013-07-17 DIAGNOSIS — M25669 Stiffness of unspecified knee, not elsewhere classified: Secondary | ICD-10-CM | POA: Diagnosis not present

## 2013-07-18 DIAGNOSIS — Z96659 Presence of unspecified artificial knee joint: Secondary | ICD-10-CM | POA: Diagnosis not present

## 2013-07-18 DIAGNOSIS — Z471 Aftercare following joint replacement surgery: Secondary | ICD-10-CM | POA: Diagnosis not present

## 2013-07-19 DIAGNOSIS — Z96659 Presence of unspecified artificial knee joint: Secondary | ICD-10-CM | POA: Diagnosis not present

## 2013-07-20 DIAGNOSIS — M25569 Pain in unspecified knee: Secondary | ICD-10-CM | POA: Diagnosis not present

## 2013-07-20 DIAGNOSIS — M999 Biomechanical lesion, unspecified: Secondary | ICD-10-CM | POA: Diagnosis not present

## 2013-07-20 DIAGNOSIS — M549 Dorsalgia, unspecified: Secondary | ICD-10-CM | POA: Diagnosis not present

## 2013-07-20 DIAGNOSIS — M5380 Other specified dorsopathies, site unspecified: Secondary | ICD-10-CM | POA: Diagnosis not present

## 2013-07-20 DIAGNOSIS — M5137 Other intervertebral disc degeneration, lumbosacral region: Secondary | ICD-10-CM | POA: Diagnosis not present

## 2013-07-24 DIAGNOSIS — Z96659 Presence of unspecified artificial knee joint: Secondary | ICD-10-CM | POA: Diagnosis not present

## 2013-07-26 DIAGNOSIS — Z96659 Presence of unspecified artificial knee joint: Secondary | ICD-10-CM | POA: Diagnosis not present

## 2013-08-02 DIAGNOSIS — Z96659 Presence of unspecified artificial knee joint: Secondary | ICD-10-CM | POA: Diagnosis not present

## 2013-09-13 DIAGNOSIS — H40009 Preglaucoma, unspecified, unspecified eye: Secondary | ICD-10-CM | POA: Diagnosis not present

## 2013-09-17 ENCOUNTER — Other Ambulatory Visit: Payer: Self-pay | Admitting: Internal Medicine

## 2013-09-25 ENCOUNTER — Other Ambulatory Visit (INDEPENDENT_AMBULATORY_CARE_PROVIDER_SITE_OTHER): Payer: Medicare Other

## 2013-09-25 DIAGNOSIS — E119 Type 2 diabetes mellitus without complications: Secondary | ICD-10-CM | POA: Diagnosis not present

## 2013-09-25 DIAGNOSIS — E7800 Pure hypercholesterolemia, unspecified: Secondary | ICD-10-CM | POA: Diagnosis not present

## 2013-09-25 DIAGNOSIS — I10 Essential (primary) hypertension: Secondary | ICD-10-CM | POA: Diagnosis not present

## 2013-09-25 LAB — LIPID PANEL
CHOLESTEROL: 158 mg/dL (ref 0–200)
HDL: 36.7 mg/dL — ABNORMAL LOW (ref >39.00)
LDL Cholesterol: 84 mg/dL (ref 0–99)
NonHDL: 121.3
TRIGLYCERIDES: 188 mg/dL — AB (ref 0.0–149.0)
Total CHOL/HDL Ratio: 4
VLDL: 37.6 mg/dL (ref 0.0–40.0)

## 2013-09-25 LAB — HEPATIC FUNCTION PANEL
ALK PHOS: 52 U/L (ref 39–117)
ALT: 22 U/L (ref 0–35)
AST: 22 U/L (ref 0–37)
Albumin: 3.8 g/dL (ref 3.5–5.2)
Bilirubin, Direct: 0.2 mg/dL (ref 0.0–0.3)
Total Bilirubin: 0.9 mg/dL (ref 0.2–1.2)
Total Protein: 6.4 g/dL (ref 6.0–8.3)

## 2013-09-25 LAB — CBC WITH DIFFERENTIAL/PLATELET
Basophils Absolute: 0 10*3/uL (ref 0.0–0.1)
Basophils Relative: 0.6 % (ref 0.0–3.0)
Eosinophils Absolute: 0.6 10*3/uL (ref 0.0–0.7)
Eosinophils Relative: 7.6 % — ABNORMAL HIGH (ref 0.0–5.0)
HCT: 39.7 % (ref 36.0–46.0)
HEMOGLOBIN: 13 g/dL (ref 12.0–15.0)
LYMPHS PCT: 32.2 % (ref 12.0–46.0)
Lymphs Abs: 2.6 10*3/uL (ref 0.7–4.0)
MCHC: 32.7 g/dL (ref 30.0–36.0)
MCV: 85.5 fl (ref 78.0–100.0)
MONOS PCT: 7.2 % (ref 3.0–12.0)
Monocytes Absolute: 0.6 10*3/uL (ref 0.1–1.0)
NEUTROS PCT: 52.4 % (ref 43.0–77.0)
Neutro Abs: 4.2 10*3/uL (ref 1.4–7.7)
Platelets: 258 10*3/uL (ref 150.0–400.0)
RBC: 4.65 Mil/uL (ref 3.87–5.11)
RDW: 15.2 % (ref 11.5–15.5)
WBC: 8 10*3/uL (ref 4.0–10.5)

## 2013-09-25 LAB — MICROALBUMIN / CREATININE URINE RATIO
Creatinine,U: 154.7 mg/dL
MICROALB/CREAT RATIO: 0.3 mg/g (ref 0.0–30.0)
Microalb, Ur: 0.4 mg/dL (ref 0.0–1.9)

## 2013-09-25 LAB — BASIC METABOLIC PANEL
BUN: 13 mg/dL (ref 6–23)
CO2: 28 meq/L (ref 19–32)
Calcium: 9.3 mg/dL (ref 8.4–10.5)
Chloride: 104 mEq/L (ref 96–112)
Creatinine, Ser: 0.8 mg/dL (ref 0.4–1.2)
GFR: 73.33 mL/min (ref >60.00)
Glucose, Bld: 107 mg/dL — ABNORMAL HIGH (ref 70–99)
POTASSIUM: 3.9 meq/L (ref 3.5–5.1)
SODIUM: 141 meq/L (ref 135–145)

## 2013-09-25 LAB — HEMOGLOBIN A1C: Hgb A1c MFr Bld: 6.6 % — ABNORMAL HIGH (ref 4.6–6.5)

## 2013-09-26 LAB — TSH: TSH: 2.77 u[IU]/mL (ref 0.35–4.50)

## 2013-09-27 ENCOUNTER — Ambulatory Visit: Payer: Medicare Other | Admitting: Internal Medicine

## 2013-09-28 ENCOUNTER — Encounter: Payer: Self-pay | Admitting: *Deleted

## 2013-10-02 ENCOUNTER — Ambulatory Visit: Payer: Self-pay | Admitting: *Deleted

## 2013-10-02 DIAGNOSIS — H9209 Otalgia, unspecified ear: Secondary | ICD-10-CM | POA: Diagnosis not present

## 2013-10-02 DIAGNOSIS — H6120 Impacted cerumen, unspecified ear: Secondary | ICD-10-CM | POA: Diagnosis not present

## 2013-10-02 DIAGNOSIS — J0100 Acute maxillary sinusitis, unspecified: Secondary | ICD-10-CM | POA: Diagnosis not present

## 2013-10-02 DIAGNOSIS — J3489 Other specified disorders of nose and nasal sinuses: Secondary | ICD-10-CM | POA: Diagnosis not present

## 2013-10-03 DIAGNOSIS — M5380 Other specified dorsopathies, site unspecified: Secondary | ICD-10-CM | POA: Diagnosis not present

## 2013-10-03 DIAGNOSIS — M999 Biomechanical lesion, unspecified: Secondary | ICD-10-CM | POA: Diagnosis not present

## 2013-10-03 DIAGNOSIS — M25569 Pain in unspecified knee: Secondary | ICD-10-CM | POA: Diagnosis not present

## 2013-10-03 DIAGNOSIS — M549 Dorsalgia, unspecified: Secondary | ICD-10-CM | POA: Diagnosis not present

## 2013-10-03 DIAGNOSIS — M5137 Other intervertebral disc degeneration, lumbosacral region: Secondary | ICD-10-CM | POA: Diagnosis not present

## 2013-10-04 ENCOUNTER — Other Ambulatory Visit: Payer: Self-pay | Admitting: Internal Medicine

## 2013-10-04 NOTE — Telephone Encounter (Signed)
Refilled citalopram #90 with one refill.   

## 2013-10-04 NOTE — Telephone Encounter (Signed)
Last refill 4.20.15, last OV 1.12.15 next OV 9.25.15.  Please advise refill.

## 2013-10-13 ENCOUNTER — Ambulatory Visit: Payer: Medicare Other | Admitting: Internal Medicine

## 2013-11-13 ENCOUNTER — Other Ambulatory Visit: Payer: Self-pay | Admitting: Internal Medicine

## 2013-11-15 ENCOUNTER — Ambulatory Visit (INDEPENDENT_AMBULATORY_CARE_PROVIDER_SITE_OTHER): Payer: Medicare Other | Admitting: General Surgery

## 2013-11-15 ENCOUNTER — Encounter: Payer: Self-pay | Admitting: General Surgery

## 2013-11-15 VITALS — BP 130/74 | HR 78 | Resp 12 | Ht 64.5 in | Wt 195.0 lb

## 2013-11-15 DIAGNOSIS — I8390 Asymptomatic varicose veins of unspecified lower extremity: Secondary | ICD-10-CM

## 2013-11-15 DIAGNOSIS — I8393 Asymptomatic varicose veins of bilateral lower extremities: Secondary | ICD-10-CM

## 2013-11-15 NOTE — Patient Instructions (Signed)
Patient to return in 1 year for follow up. The patient is aware to call back for any questions or concerns.  

## 2013-11-15 NOTE — Progress Notes (Signed)
Patient ID: Carrie Mills, female   DOB: 07-14-1940, 73 y.o.   MRN: [id number]  Chief Complaint  Patient presents with  . Follow-up    varicose veins follow up    HPI Carrie Mills is a 73 y.o. female who presents for a follow up of varicose veins. She denies any problems with her legs at this time. She is using her compression hose on a daily basis.   HPIAfter her last visit here 6 mos ago she had successful left TKR. Currently doing well.   Past Medical History  Diagnosis Date  . Allergy   . Hyperlipidemia   . Colon polyps   . Phlebitis   . Hyperglycemia   . Hypertension   . Varicose veins     Past Surgical History  Procedure Laterality Date  . Appendectomy  1987  . Total abdominal hysterectomy w/ bilateral salpingoophorectomy  1987  . Abdominal hysterectomy  1987  . Thrombectomy    . Colectomy  2004  . Colonoscopy  2009  . Replacement total knee Left 06/05/13    Family History  Problem Relation Age of Onset  . Arthritis Mother   . Heart disease Mother     s/p CABG  . Hypertension Mother   . Heart disease Father   . Hypertension Father   . Diabetes Father   . Breast cancer Neg Hx   . Colon cancer Neg Hx     Social History History  Substance Use Topics  . Smoking status: Never Smoker   . Smokeless tobacco: Never Used  . Alcohol Use: No    Allergies  Allergen Reactions  . Sulfa Antibiotics Hives and Other (See Comments)    Fainting    Current Outpatient Prescriptions  Medication Sig Dispense Refill  . ACCU-CHEK AVIVA PLUS test strip TEST BLOOD SUGAR ONCE A DAY OR AS INSTRUCTED. DX 250.0  100 each  3  . budesonide-formoterol (SYMBICORT) 160-4.5 MCG/ACT inhaler Inhale 2 puffs into the lungs daily.      . Calcium Carbonate Antacid 400 MG CHEW Chew by mouth daily.      . citalopram (CELEXA) 10 MG tablet TAKE 1 TABLET DAILY  90 tablet  1  . Coconut Oil 1000 MG CAPS Take by mouth.      . fluticasone (FLONASE) 50 MCG/ACT nasal spray       .  Glucosamine HCl 1000 MG TABS Take by mouth.      . hydrochlorothiazide (HYDRODIURIL) 25 MG tablet TAKE 1 TABLET DAILY  90 tablet  0  . metoprolol succinate (TOPROL-XL) 25 MG 24 hr tablet TAKE 1 TABLET TWICE A DAY  180 tablet  0  . mometasone (ELOCON) 0.1 % lotion Use as directed  60 mL  0  . Montelukast Sodium (SINGULAIR PO) Take by mouth daily.      . Multiple Vitamin (MULTIVITAMIN) capsule Take 1 capsule by mouth daily.      . Omega-3 Fatty Acids (FISH OIL) 1200 MG CAPS Take by mouth daily.      . pravastatin (PRAVACHOL) 40 MG tablet TAKE 1 TABLET BY MOUTH DAILY.  90 tablet  1  . sodium chloride (OCEAN) 0.65 % nasal spray Place 1 spray into the nose as needed.      . traMADol (ULTRAM) 50 MG tablet Take 50 mg by mouth every 12 (twelve) hours as needed.       . triamcinolone (NASACORT) 55 MCG/ACT nasal inhaler Place 2 sprays into the nose daily.  No current facility-administered medications for this visit.    Review of Systems Review of Systems  Constitutional: Negative.   Respiratory: Negative.   Cardiovascular: Negative.     Blood pressure 130/74, pulse 78, resp. rate 12, height 5' 4.5" (1.638 m), weight 195 lb (88.451 kg).  Physical Exam Physical Exam  Constitutional: She is oriented to person, place, and time. She appears well-developed and well-nourished.  Cardiovascular:  Pulses:      Dorsalis pedis pulses are 2+ on the right side.       Posterior tibial pulses are 2+ on the right side.  Left leg appears larger but no edema. Well healed incision from recent TKR. Varicose veins are less prominent.   Neurological: She is alert and oriented to person, place, and time.  Skin: Skin is warm and dry.    Data Reviewed    Assessment    VV asymptomatic, under good control with compression hose.     Plan    1 yr f/u        [facility] 11/15/2013, 10:39 AM

## 2013-11-23 DIAGNOSIS — M999 Biomechanical lesion, unspecified: Secondary | ICD-10-CM | POA: Diagnosis not present

## 2013-11-23 DIAGNOSIS — M5380 Other specified dorsopathies, site unspecified: Secondary | ICD-10-CM | POA: Diagnosis not present

## 2013-11-23 DIAGNOSIS — M5137 Other intervertebral disc degeneration, lumbosacral region: Secondary | ICD-10-CM | POA: Diagnosis not present

## 2013-11-23 DIAGNOSIS — M549 Dorsalgia, unspecified: Secondary | ICD-10-CM | POA: Diagnosis not present

## 2013-11-24 ENCOUNTER — Other Ambulatory Visit: Payer: Self-pay | Admitting: Internal Medicine

## 2013-11-24 NOTE — Telephone Encounter (Signed)
Appt 12/08/13

## 2013-12-08 ENCOUNTER — Ambulatory Visit (INDEPENDENT_AMBULATORY_CARE_PROVIDER_SITE_OTHER): Payer: Medicare Other | Admitting: Internal Medicine

## 2013-12-08 ENCOUNTER — Encounter: Payer: Self-pay | Admitting: Internal Medicine

## 2013-12-08 VITALS — BP 128/60 | HR 65 | Temp 98.2°F | Resp 14 | Ht 64.5 in | Wt 195.5 lb

## 2013-12-08 DIAGNOSIS — M25569 Pain in unspecified knee: Secondary | ICD-10-CM

## 2013-12-08 DIAGNOSIS — Z86010 Personal history of colonic polyps: Secondary | ICD-10-CM

## 2013-12-08 DIAGNOSIS — I10 Essential (primary) hypertension: Secondary | ICD-10-CM

## 2013-12-08 DIAGNOSIS — I8393 Asymptomatic varicose veins of bilateral lower extremities: Secondary | ICD-10-CM

## 2013-12-08 DIAGNOSIS — E7800 Pure hypercholesterolemia, unspecified: Secondary | ICD-10-CM

## 2013-12-08 DIAGNOSIS — I8390 Asymptomatic varicose veins of unspecified lower extremity: Secondary | ICD-10-CM

## 2013-12-08 DIAGNOSIS — M25562 Pain in left knee: Secondary | ICD-10-CM

## 2013-12-08 DIAGNOSIS — E119 Type 2 diabetes mellitus without complications: Secondary | ICD-10-CM | POA: Diagnosis not present

## 2013-12-08 DIAGNOSIS — Z23 Encounter for immunization: Secondary | ICD-10-CM

## 2013-12-08 DIAGNOSIS — Z9109 Other allergy status, other than to drugs and biological substances: Secondary | ICD-10-CM

## 2013-12-08 DIAGNOSIS — Z1239 Encounter for other screening for malignant neoplasm of breast: Secondary | ICD-10-CM

## 2013-12-08 MED ORDER — TRAMADOL HCL 50 MG PO TABS: 50.0000 mg | ORAL_TABLET | Freq: Every day | ORAL | Status: DC | PRN

## 2013-12-08 NOTE — Progress Notes (Signed)
Pre visit review using our clinic review tool, if applicable. No additional management support is needed unless otherwise documented below in the visit note. 

## 2013-12-10 ENCOUNTER — Encounter: Payer: Self-pay | Admitting: Internal Medicine

## 2013-12-10 NOTE — Progress Notes (Signed)
Subjective:    Patient ID: Carrie Mills, female    DOB: Aug 08, 1940, 73 y.o.   MRN: [id number]  HPI 73 year old female with past history of hypertension, hypercholesterolemia and hyperglycemia who comes in today for a scheduled follow up.  States she has been doing well.  Feels she is handling stress relatively well.  Has a lot of stress with her sons medical issues.   States sugars doing ok.  See attached list.   Breathing is stable.  No nausea or vomiting.  No cardiac symptoms with increased activity or exertion.  Is riding her bike.   No chest pain or tightness with increased activity or exertion.   Sees Dr Donneta Romberg.  Is s/p left knee replacement.  On tramadol.  Pain better.  Discussed using tylenol and gentle use of ibuprofen instead of tramadol.  Overall she feels she is doing well.     Past Medical History  Diagnosis Date  . Allergy   . Hyperlipidemia   . Colon polyps   . Phlebitis   . Hyperglycemia   . Hypertension   . Varicose veins      Current Outpatient Prescriptions on File Prior to Visit  Medication Sig Dispense Refill  . ACCU-CHEK AVIVA PLUS test strip TEST BLOOD SUGAR ONCE A DAY OR AS INSTRUCTED. DX 250.0  100 each  3  . budesonide-formoterol (SYMBICORT) 160-4.5 MCG/ACT inhaler Inhale 2 puffs into the lungs daily.      . Calcium Carbonate Antacid 400 MG CHEW Chew by mouth daily.      . citalopram (CELEXA) 10 MG tablet TAKE 1 TABLET DAILY  90 tablet  1  . Coconut Oil 1000 MG CAPS Take by mouth.      . fluticasone (FLONASE) 50 MCG/ACT nasal spray       . Glucosamine HCl 1000 MG TABS Take by mouth.      . hydrochlorothiazide (HYDRODIURIL) 25 MG tablet TAKE 1 TABLET DAILY  90 tablet  0  . metoprolol succinate (TOPROL-XL) 25 MG 24 hr tablet TAKE 1 TABLET TWICE A DAY  180 tablet  0  . mometasone (ELOCON) 0.1 % lotion Use as directed  60 mL  0  . Montelukast Sodium (SINGULAIR PO) Take by mouth daily.      . Multiple Vitamin (MULTIVITAMIN) capsule Take 1 capsule by mouth  daily.      . Omega-3 Fatty Acids (FISH OIL) 1200 MG CAPS Take by mouth daily.      . pravastatin (PRAVACHOL) 40 MG tablet TAKE 1 TABLET BY MOUTH DAILY.  90 tablet  1  . sodium chloride (OCEAN) 0.65 % nasal spray Place 1 spray into the nose as needed.      . triamcinolone (NASACORT) 55 MCG/ACT nasal inhaler Place 2 sprays into the nose daily.       No current facility-administered medications on file prior to visit.     Review of Systems Patient denies any headache, lightheadedness or dizziness.  No chest pain, tightness or palpitations.  No cardiac symptoms with increased activity or exertion.   No increased shortness of breath.  No nausea or vomiting.  No abdominal pain or cramping. No acid reflux.  No bowel change, such as diarrhea, constipation, BRBPR or melana.   No urine change.  S/p knee surgery.  Doing well.        Objective:   Physical Exam  Filed Vitals:   12/08/13 1457  BP: 128/60  Pulse: 65  Temp: 98.2 F (36.8 C)  Resp: 14   Blood pressure recheck:  13/70  73 year old female in no acute distress.   HEENT:  Nares- clear.  Oropharynx - without lesions.    NECK:  Supple.  Nontender.  No audible bruit.  HEART:  Appears to be regular. LUNGS:  No crackles or wheezing audible.  Respirations even and unlabored.  RADIAL PULSE:  Equal bilaterally.  ABDOMEN:  Soft, nontender.  Bowel sounds present and normal.  No audible abdominal bruit.   EXTREMITIES:  No increased edema present.  DP pulses palpable and equal bilaterally.      FEET:  No lesions.    Assessment & Plan:  REOCCURRING ALLERGY/SINUS ISSUES.  Sees Dr Donneta Romberg.     MSK.  Followed by ortho.      INCREASED PSYCHOSOCIAL STRESSORS.  Doing well on Celexa.  Follow.   HEALTH MAINTENANCE.  Physical 03/27/13.  Mammogram 02/19/12 - BiRADS II.   Needs a f/u mammogram.  She is s/p hysterectomy and does not require pap smears.

## 2013-12-11 DIAGNOSIS — M25562 Pain in left knee: Secondary | ICD-10-CM | POA: Insufficient documentation

## 2013-12-11 NOTE — Assessment & Plan Note (Signed)
Low cholesterol diet and exercise.  On no medication.  Follow.  Triglycerides slightly increased.  Low carb diet.  Follow.  LDL 84.

## 2013-12-11 NOTE — Assessment & Plan Note (Signed)
Followed by Dr Jamal Collin.  Stable.  Support hose.

## 2013-12-11 NOTE — Assessment & Plan Note (Signed)
Blood pressure under good control.  Same medication regimen.  Follow metabolic panel.   

## 2013-12-11 NOTE — Assessment & Plan Note (Signed)
Low carb diet and exercise.  Follow metabolic panel and M3W.  Keep up to date with eye exams.  A1c last checked - 6.6.  Follow.

## 2013-12-11 NOTE — Assessment & Plan Note (Signed)
Colonoscopy 12/27/12 - normal.  Recommend f/u colonoscopy five years.

## 2013-12-11 NOTE — Assessment & Plan Note (Signed)
Sees Dr Donneta Romberg.   Follow.

## 2013-12-11 NOTE — Assessment & Plan Note (Signed)
Is now s/u knee surgery.  Followed by Dr Marry Guan.  Doing well.  Will try to get her off tramadol.  Use tylenol and ibuprofen as directed if needed.  Follow.

## 2014-01-15 ENCOUNTER — Encounter: Payer: Self-pay | Admitting: Internal Medicine

## 2014-01-31 DIAGNOSIS — Z85828 Personal history of other malignant neoplasm of skin: Secondary | ICD-10-CM | POA: Diagnosis not present

## 2014-01-31 DIAGNOSIS — L821 Other seborrheic keratosis: Secondary | ICD-10-CM | POA: Diagnosis not present

## 2014-02-22 ENCOUNTER — Other Ambulatory Visit: Payer: Self-pay | Admitting: Internal Medicine

## 2014-02-26 DIAGNOSIS — M9905 Segmental and somatic dysfunction of pelvic region: Secondary | ICD-10-CM | POA: Diagnosis not present

## 2014-02-26 DIAGNOSIS — M545 Low back pain: Secondary | ICD-10-CM | POA: Diagnosis not present

## 2014-02-26 DIAGNOSIS — M5387 Other specified dorsopathies, lumbosacral region: Secondary | ICD-10-CM | POA: Diagnosis not present

## 2014-02-26 DIAGNOSIS — M791 Myalgia: Secondary | ICD-10-CM | POA: Diagnosis not present

## 2014-02-26 DIAGNOSIS — M9904 Segmental and somatic dysfunction of sacral region: Secondary | ICD-10-CM | POA: Diagnosis not present

## 2014-02-26 DIAGNOSIS — M9903 Segmental and somatic dysfunction of lumbar region: Secondary | ICD-10-CM | POA: Diagnosis not present

## 2014-02-26 DIAGNOSIS — M5136 Other intervertebral disc degeneration, lumbar region: Secondary | ICD-10-CM | POA: Diagnosis not present

## 2014-04-03 ENCOUNTER — Other Ambulatory Visit: Payer: Self-pay | Admitting: Internal Medicine

## 2014-04-09 ENCOUNTER — Other Ambulatory Visit: Payer: Medicare Other

## 2014-04-11 ENCOUNTER — Encounter: Payer: Medicare Other | Admitting: Internal Medicine

## 2014-04-12 DIAGNOSIS — M5136 Other intervertebral disc degeneration, lumbar region: Secondary | ICD-10-CM | POA: Diagnosis not present

## 2014-04-12 DIAGNOSIS — M791 Myalgia: Secondary | ICD-10-CM | POA: Diagnosis not present

## 2014-04-12 DIAGNOSIS — M9904 Segmental and somatic dysfunction of sacral region: Secondary | ICD-10-CM | POA: Diagnosis not present

## 2014-04-12 DIAGNOSIS — M9903 Segmental and somatic dysfunction of lumbar region: Secondary | ICD-10-CM | POA: Diagnosis not present

## 2014-04-12 DIAGNOSIS — M545 Low back pain: Secondary | ICD-10-CM | POA: Diagnosis not present

## 2014-04-12 DIAGNOSIS — M9905 Segmental and somatic dysfunction of pelvic region: Secondary | ICD-10-CM | POA: Diagnosis not present

## 2014-04-12 DIAGNOSIS — M5387 Other specified dorsopathies, lumbosacral region: Secondary | ICD-10-CM | POA: Diagnosis not present

## 2014-04-30 ENCOUNTER — Other Ambulatory Visit: Payer: Medicare Other

## 2014-05-01 ENCOUNTER — Encounter (INDEPENDENT_AMBULATORY_CARE_PROVIDER_SITE_OTHER): Payer: Self-pay

## 2014-05-01 ENCOUNTER — Other Ambulatory Visit (INDEPENDENT_AMBULATORY_CARE_PROVIDER_SITE_OTHER): Payer: Medicare Other

## 2014-05-01 DIAGNOSIS — E7800 Pure hypercholesterolemia, unspecified: Secondary | ICD-10-CM

## 2014-05-01 DIAGNOSIS — I10 Essential (primary) hypertension: Secondary | ICD-10-CM

## 2014-05-01 DIAGNOSIS — E119 Type 2 diabetes mellitus without complications: Secondary | ICD-10-CM | POA: Diagnosis not present

## 2014-05-01 LAB — LIPID PANEL
Cholesterol: 172 mg/dL (ref 0–200)
HDL: 43.8 mg/dL (ref >39.00)
LDL CALC: 95 mg/dL (ref 0–99)
NonHDL: 128.2
TRIGLYCERIDES: 168 mg/dL — AB (ref 0.0–149.0)
Total CHOL/HDL Ratio: 4
VLDL: 33.6 mg/dL (ref 0.0–40.0)

## 2014-05-01 LAB — COMPREHENSIVE METABOLIC PANEL
ALK PHOS: 113 U/L (ref 39–117)
ALT: 20 U/L (ref 0–35)
AST: 20 U/L (ref 0–37)
Albumin: 3.9 g/dL (ref 3.5–5.2)
BILIRUBIN TOTAL: 0.7 mg/dL (ref 0.2–1.2)
BUN: 19 mg/dL (ref 6–23)
CHLORIDE: 102 meq/L (ref 96–112)
CO2: 32 mEq/L (ref 19–32)
Calcium: 9.5 mg/dL (ref 8.4–10.5)
Creatinine, Ser: 1.01 mg/dL (ref 0.40–1.20)
GFR: 56.75 mL/min — ABNORMAL LOW (ref >60.00)
Glucose, Bld: 116 mg/dL — ABNORMAL HIGH (ref 70–99)
Potassium: 4.4 mEq/L (ref 3.5–5.1)
Sodium: 138 mEq/L (ref 135–145)
Total Protein: 6.8 g/dL (ref 6.0–8.3)

## 2014-05-01 LAB — HEMOGLOBIN A1C: Hgb A1c MFr Bld: 6.5 % (ref 4.6–6.5)

## 2014-05-04 ENCOUNTER — Encounter: Payer: Self-pay | Admitting: Internal Medicine

## 2014-05-04 ENCOUNTER — Ambulatory Visit (INDEPENDENT_AMBULATORY_CARE_PROVIDER_SITE_OTHER): Payer: Medicare Other | Admitting: Internal Medicine

## 2014-05-04 VITALS — BP 110/80 | HR 65 | Temp 98.1°F | Ht 65.0 in | Wt 206.5 lb

## 2014-05-04 DIAGNOSIS — Z86010 Personal history of colonic polyps: Secondary | ICD-10-CM | POA: Diagnosis not present

## 2014-05-04 DIAGNOSIS — I8393 Asymptomatic varicose veins of bilateral lower extremities: Secondary | ICD-10-CM

## 2014-05-04 DIAGNOSIS — E7800 Pure hypercholesterolemia, unspecified: Secondary | ICD-10-CM

## 2014-05-04 DIAGNOSIS — M25562 Pain in left knee: Secondary | ICD-10-CM | POA: Diagnosis not present

## 2014-05-04 DIAGNOSIS — Z0000 Encounter for general adult medical examination without abnormal findings: Secondary | ICD-10-CM

## 2014-05-04 DIAGNOSIS — I10 Essential (primary) hypertension: Secondary | ICD-10-CM | POA: Diagnosis not present

## 2014-05-04 DIAGNOSIS — E119 Type 2 diabetes mellitus without complications: Secondary | ICD-10-CM | POA: Diagnosis not present

## 2014-05-04 NOTE — Progress Notes (Signed)
Patient ID: Carrie Mills, female   DOB: 17-Aug-1940, 74 y.o.   MRN: [id number]   Subjective:    Patient ID: Carrie Mills, female    DOB: March 13, 1941, 74 y.o.   MRN: [id number]  HPI  Patient here for her physical exam.  She states she is doing well.  Tries to stay active.  Is exercising.  No cardiac symptoms with increased activity or exertion.  Breathing stable.  Blood sugars averaging 102-120 in the am and pm sugars 115-140 in the pm.  Trying to watch what she eats.  Bowels stable.  Knee is doing well.     Past Medical History  Diagnosis Date  . Allergy   . Hyperlipidemia   . Colon polyps   . Phlebitis   . Hyperglycemia   . Hypertension   . Varicose veins     Current Outpatient Prescriptions on File Prior to Visit  Medication Sig Dispense Refill  . ACCU-CHEK AVIVA PLUS test strip TEST BLOOD SUGAR ONCE A DAY OR AS INSTRUCTED. DX 250.0 100 each 3  . budesonide-formoterol (SYMBICORT) 160-4.5 MCG/ACT inhaler Inhale 2 puffs into the lungs daily.    . Calcium Carbonate Antacid 400 MG CHEW Chew by mouth daily.    . citalopram (CELEXA) 10 MG tablet TAKE 1 TABLET DAILY 90 tablet 0  . Coconut Oil 1000 MG CAPS Take by mouth.    . fluticasone (FLONASE) 50 MCG/ACT nasal spray     . Glucosamine HCl 1000 MG TABS Take by mouth.    . hydrochlorothiazide (HYDRODIURIL) 25 MG tablet TAKE 1 TABLET DAILY 90 tablet 1  . metoprolol succinate (TOPROL-XL) 25 MG 24 hr tablet TAKE 1 TABLET TWICE A DAY 180 tablet 1  . mometasone (ELOCON) 0.1 % lotion Use as directed 60 mL 0  . Montelukast Sodium (SINGULAIR PO) Take by mouth daily.    . Multiple Vitamin (MULTIVITAMIN) capsule Take 1 capsule by mouth daily.    . Omega-3 Fatty Acids (FISH OIL) 1200 MG CAPS Take by mouth daily.    . sodium chloride (OCEAN) 0.65 % nasal spray Place 1 spray into the nose as needed.    . triamcinolone (NASACORT) 55 MCG/ACT nasal inhaler Place 2 sprays into the nose daily.     No current facility-administered medications on  file prior to visit.    Review of Systems  Constitutional: Negative for appetite change and unexpected weight change.  HENT: Negative for congestion, sinus pressure and sore throat.   Eyes: Negative for pain and visual disturbance.  Respiratory: Negative for cough, chest tightness and shortness of breath.   Cardiovascular: Negative for chest pain, palpitations and leg swelling.  Gastrointestinal: Negative for abdominal pain, diarrhea and constipation.  Genitourinary: Negative for frequency and difficulty urinating.  Musculoskeletal: Negative for back pain and joint swelling.  Skin: Negative for color change and rash.  Neurological: Negative for dizziness, light-headedness and headaches.  Hematological: Negative for adenopathy. Does not bruise/bleed easily.  Psychiatric/Behavioral: Negative for dysphoric mood and agitation.       Objective:    Physical Exam  Constitutional: She is oriented to person, place, and time. She appears well-developed and well-nourished.  HENT:  Nose: Nose normal.  Mouth/Throat: Oropharynx is clear and moist.  Eyes: Right eye exhibits no discharge. Left eye exhibits no discharge. No scleral icterus.  Neck: Neck supple. No thyromegaly present.  Cardiovascular: Normal rate and regular rhythm.   Pulmonary/Chest: Breath sounds normal. No accessory muscle usage. No tachypnea. No respiratory distress. She has no  decreased breath sounds. She has no wheezes. She has no rhonchi. Right breast exhibits no inverted nipple, no mass, no nipple discharge and no tenderness (no axillary adenopathy). Left breast exhibits no inverted nipple, no mass, no nipple discharge and no tenderness (no axilarry adenopathy).  Abdominal: Soft. Bowel sounds are normal. There is no tenderness.  Musculoskeletal: She exhibits no edema or tenderness.  Lymphadenopathy:    She has no cervical adenopathy.  Neurological: She is alert and oriented to person, place, and time.  Skin: Skin is warm. No  rash noted.  Psychiatric: She has a normal mood and affect. Her behavior is normal.    BP 110/80 mmHg  Pulse 65  Temp(Src) 98.1 F (36.7 C) (Oral)  Ht 5' 5"  (1.651 m)  Wt 206 lb 8 oz (93.668 kg)  BMI 34.36 kg/m2  SpO2 96% Wt Readings from Last 3 Encounters:  05/04/14 206 lb 8 oz (93.668 kg)  12/08/13 195 lb 8 oz (88.678 kg)  11/15/13 195 lb (88.451 kg)     Lab Results  Component Value Date   WBC 8.0 09/25/2013   HGB 13.0 09/25/2013   HCT 39.7 09/25/2013   PLT 258.0 09/25/2013   GLUCOSE 116* 05/01/2014   CHOL 172 05/01/2014   TRIG 168.0* 05/01/2014   HDL 43.80 05/01/2014   LDLDIRECT 114.6 01/11/2012   LDLCALC 95 05/01/2014   ALT 20 05/01/2014   AST 20 05/01/2014   NA 138 05/01/2014   K 4.4 05/01/2014   CL 102 05/01/2014   CREATININE 1.01 05/01/2014   BUN 19 05/01/2014   CO2 32 05/01/2014   TSH 2.77 09/25/2013   HGBA1C 6.5 05/01/2014   MICROALBUR 0.4 09/25/2013       Assessment & Plan:   Problem List Items Addressed This Visit    Asymptomatic varicose veins    Sees Dr Jamal Collin.  Continue compression hose.        Diabetes    Low carb diet and exercise.  A1c just checked and slightly improved.  a1c 6.5.  Keep up to date with eye exams.         Relevant Orders   Hemoglobin A1c   Health care maintenance    Physical today.  Colonoscopy 12/27/12.  Overdue mammogram.  Schedule.        History of colonic polyps    Colonoscopy 12/27/12 - normal.  Recommended f/u colonoscopy in five years.        Hypercholesterolemia    Low cholesterol diet and exercise.  Follow lipid panel and liver function tests.  On pravastatin.        Relevant Orders   Lipid panel   Hepatic function panel   Hypertension - Primary    Blood pressure is under good control.  Continue same medication regimen.  Follow pressure.  Follow met b.        Relevant Orders   Basic metabolic panel   Knee pain, left    Is now s/p knee surgery.  Followed by Dr Marry Guan.  Doing well.           I spent 25 minutes with the patient and more than 50% of the time was spent in consultation regarding the above.     Einar Pheasant, MD

## 2014-05-04 NOTE — Progress Notes (Signed)
Pre visit review using our clinic review tool, if applicable. No additional management support is needed unless otherwise documented below in the visit note. 

## 2014-05-07 ENCOUNTER — Other Ambulatory Visit: Payer: Self-pay | Admitting: Internal Medicine

## 2014-05-07 ENCOUNTER — Encounter: Payer: Self-pay | Admitting: Internal Medicine

## 2014-05-07 DIAGNOSIS — Z0000 Encounter for general adult medical examination without abnormal findings: Secondary | ICD-10-CM | POA: Insufficient documentation

## 2014-05-07 NOTE — Assessment & Plan Note (Signed)
Is now s/p knee surgery.  Followed by Dr Marry Guan.  Doing well.

## 2014-05-07 NOTE — Assessment & Plan Note (Signed)
Low carb diet and exercise.  A1c just checked and slightly improved.  a1c 6.5.  Keep up to date with eye exams.

## 2014-05-07 NOTE — Assessment & Plan Note (Signed)
Sees Dr Jamal Collin.  Continue compression hose.

## 2014-05-07 NOTE — Assessment & Plan Note (Signed)
Low cholesterol diet and exercise.  Follow lipid panel and liver function tests.  On pravastatin.   

## 2014-05-07 NOTE — Assessment & Plan Note (Signed)
Colonoscopy 12/27/12 - normal.  Recommended f/u colonoscopy in five years.

## 2014-05-07 NOTE — Assessment & Plan Note (Signed)
Physical today.  Colonoscopy 12/27/12.  Overdue mammogram.  Schedule.

## 2014-05-07 NOTE — Assessment & Plan Note (Signed)
Blood pressure is under good control.  Continue same medication regimen.  Follow pressure.  Follow met b.

## 2014-05-10 ENCOUNTER — Ambulatory Visit: Payer: Self-pay | Admitting: Internal Medicine

## 2014-05-10 DIAGNOSIS — Z1231 Encounter for screening mammogram for malignant neoplasm of breast: Secondary | ICD-10-CM | POA: Diagnosis not present

## 2014-05-10 LAB — HM MAMMOGRAPHY: HM Mammogram: NEGATIVE

## 2014-05-11 ENCOUNTER — Encounter: Payer: Self-pay | Admitting: Internal Medicine

## 2014-05-17 DIAGNOSIS — J4530 Mild persistent asthma, uncomplicated: Secondary | ICD-10-CM | POA: Diagnosis not present

## 2014-05-17 DIAGNOSIS — J300 Vasomotor rhinitis: Secondary | ICD-10-CM | POA: Diagnosis not present

## 2014-07-03 DIAGNOSIS — M9904 Segmental and somatic dysfunction of sacral region: Secondary | ICD-10-CM | POA: Diagnosis not present

## 2014-07-03 DIAGNOSIS — M9903 Segmental and somatic dysfunction of lumbar region: Secondary | ICD-10-CM | POA: Diagnosis not present

## 2014-07-03 DIAGNOSIS — M791 Myalgia: Secondary | ICD-10-CM | POA: Diagnosis not present

## 2014-07-03 DIAGNOSIS — M9905 Segmental and somatic dysfunction of pelvic region: Secondary | ICD-10-CM | POA: Diagnosis not present

## 2014-07-03 DIAGNOSIS — M5136 Other intervertebral disc degeneration, lumbar region: Secondary | ICD-10-CM | POA: Diagnosis not present

## 2014-07-03 DIAGNOSIS — M5387 Other specified dorsopathies, lumbosacral region: Secondary | ICD-10-CM | POA: Diagnosis not present

## 2014-07-03 DIAGNOSIS — M545 Low back pain: Secondary | ICD-10-CM | POA: Diagnosis not present

## 2014-07-07 NOTE — Op Note (Signed)
PATIENT NAME:  Carrie Mills, Carrie Mills MR#:  [id number] DATE OF BIRTH:  [birth year]  DATE OF PROCEDURE:  06/05/2013  PREOPERATIVE DIAGNOSIS: Degenerative arthrosis of the left knee.   POSTOPERATIVE DIAGNOSIS: Degenerative arthrosis of the left knee.   PROCEDURE PERFORMED: Left total knee arthroplasty using computer-assisted navigation.   SURGEON: Laurice Record. Hooten, MD  ASSISTANT: Vance Peper, PA (required to maintain retraction throughout the procedure).   ANESTHESIA: Spinal.   ESTIMATED BLOOD LOSS: 250 mL.   FLUIDS REPLACED: 800 mL of crystalloid.   TOURNIQUET TIME: 109 minutes.   DRAINS: Two medium drains to reinfusion system.   SOFT TISSUE RELEASES: Anterior cruciate ligament, posterior cruciate ligament, deep medial collateral ligament, and patellofemoral ligament.   IMPLANTS UTILIZED: DePuy size 4 posterior stabilized femoral component (cemented), size 4 MBT tibial component (cemented), 38 mm three-peg oval dome patella (cemented), and a 10 mm stabilized rotating platform polyethylene insert.   Gentamicin bone cement was utilized due to the patient's history of diabetes.   INDICATIONS FOR SURGERY: The patient is a 74 year old female who has been seen for complaints of progressive left knee pain. X-rays demonstrated severe degenerative changes in tricompartmental fashion with relative varus deformity. After discussion of the risks and benefits of surgical intervention, the patient expressed understanding of the risks, benefits, and agreed with plans for surgical intervention.   PROCEDURE IN DETAIL: The patient was brought to the operating room and, after adequate spinal anesthesia was achieved, a tourniquet was placed on the patient's upper left thigh. The patient's left knee and leg were cleaned and prepped with alcohol and DuraPrep and draped in the usual sterile fashion. A "timeout" was performed as per usual protocol. The left lower extremity was exsanguinated using an Esmarch, and the  tourniquet was inflated to 300 mmHg.  An anterior longitudinal incision was made followed by a standard mid vastus approach. A large effusion was evacuated. The deep fibers of the medial collateral ligament were elevated in a subperiosteal fashion off the medial flare of the tibia so as to maintain a continuous soft-tissue sleeve. The patella was subluxed laterally and the patellofemoral ligament was incised.   Inspection of the knee demonstrated severe degenerative changes with full-thickness loss of articular cartilage to the medial compartment. Prominent osteophytes were debrided using a rongeur. Anterior and posterior cruciate ligaments were excised. Two 4.0 mm Schanz pins were inserted into the femur and into the tibia for attachment of the array of trackers used for computer-assisted navigation.   Initial attempt at mapping was aborted due to a problem with one of the trackers. While replacement device was being prepared, it was elected to proceed with cutting of the patella and preparing it so as to accommodate a 38 mm three-peg oval dome patella. At this point, the tracker device was reattached and hip center was identified using circumduction technique. Distal landmarks were mapped using the computer. The distal femur and proximal tibia were mapped using computer.   Distal femoral cutting guide was positioned using computer-assisted navigation so as to achieve a 5-degree distal valgus cut. Cut was performed and verified using the computer. Distal femur was sized and it was felt that a size 4 femoral component was appropriate. A size 4 cutting guide was positioned and anterior cut was performed and verified using the computer. This was followed by completion of the posterior and chamfer cuts. Femoral cutting guide for the central box was then positioned, and the central box cut was performed.  Attention was then directed to the proximal  tibia. Medial and lateral menisci were excised. The  extramedullary tibial cutting guide was positioned using computer-assisted navigation so as to achieve a 0-degree varus valgus alignment and 0-degree posterior slope. Cut was performed and verified using the computer. Proximal tibia was sized and it was felt that a size 4 tibial tray was appropriate. Tibial and femoral trials were inserted followed by insertion of a 10 mm polyethylene trial. Excellent mediolateral soft-tissue balance was appreciated, both in full extension and in flexion. Good patella tracking was noted. The femoral trial was removed. Central post hole for the tibial component was reamed followed by insertion of a keel punch. Tibial trial was then removed.   The cut surfaces of bone were irrigated with copious amounts of normal saline with antibiotic solution using pulsatile lavage and then suctioned dry. Polymethyl methacrylate cement with gentamicin was prepared in the usual fashion using a vacuum mixer. Cement was applied to the cut surface of the proximal tibia as well as along the undersurface of a size 4 MBT tibial component. The tibial component was positioned and impacted into place. Excess cement was removed using [employer] fast streamer.   Cement was then applied to the cut surface of the femur as well as along the posterior flanges of a size 4 posterior stabilized femoral component. Femoral component was positioned and impacted into place. Excess cement was removed using [employer] fast streamer. A 10 mm polyethylene trial was inserted and the knee was brought in full extension with steady axial compression applied. Finally, cement was applied to the backside of a 38 mm three-peg oval dome patella, and the patellar component was positioned and patellar clamp applied. Excess cement was removed using [employer] fast streamer.   After adequate curing of the cement, tourniquet was deflated after a total tourniquet time of 109 minutes. Hemostasis was achieved using electrocautery. The knee was irrigated with  copious amounts of normal saline with antibiotic solution using pulsatile lavage and then suctioned dry. The knee was inspected for any residual cement debris. Then, 20 mL of 1.3% Exparel in 40 mL of normal saline was injected along the posterior capsule, medial and lateral gutters, and along the arthrotomy site.   A 10 mm stabilized rotating platform polyethylene insert was inserted and the knee was placed through a range of motion. Excellent mediolateral soft-tissue balancing was appreciated and excellent patellar tracking was appreciated. Two medium drains were placed in the wound bed and broght out through separate stab incisions to be attached to a reinfusion system. The medial parapatellar portion of the incision was reapproximated using sutures of #1 Vicryl. The subcutaneous tissue was approximated in layers using first #0 Vicryl followed by 2-0 Vicryl. Then, 30 mL of 0.25% Marcaine with epinephrine was injected in the subcutaneous tissue in line with the skin incision. The skin was reapproximated using skin staples. Sterile dressing was applied.   The patient tolerated the procedure well. She was transported to the recovery room in stable condition.   ____________________________ Laurice Record. Holley Bouche., MD jph:np D: 06/05/2013 19:54:56 ET T: 06/05/2013 22:50:30 ET JOB#: [id number]  cc: Jeneen Rinks P. Holley Bouche., MD, <Dictator> JAMES P Holley Bouche MD ELECTRONICALLY SIGNED 06/07/2013 20:07

## 2014-07-07 NOTE — Discharge Summary (Signed)
ADDENDUM  PATIENT NAME:  Carrie Mills, Carrie Mills MR#:  [id number] DATE OF BIRTH:  1940/05/02  DATE OF ADMISSION:  06/05/2013 DATE OF DISCHARGE: 06/07/2013   The patient was initially thought to go home, but based on her physical therapy it was decided to send her to a skilled nursing rehab center. No change in orders are to be done. The patient will continue with her regular follow up appointment with Dr. Marry Guan.  ____________________________ Lenna Sciara. Reche Dixon, [place] jtm:aw D: 06/08/2013 13:30:41 ET T: 06/08/2013 13:37:55 ET JOB#: [id number]  cc: J. Reche Dixon, [place], <Dictator> J Leara Rawl [facility] PA ELECTRONICALLY SIGNED 06/28/2013 6:37

## 2014-07-07 NOTE — Discharge Summary (Signed)
PATIENT NAME:  Carrie Mills, Carrie Mills MR#:  [id number] DATE OF BIRTH:  1940-05-13  DATE OF ADMISSION:  06/05/2013 DATE OF DISCHARGE:  06/08/2013   ADMITTING DIAGNOSIS: Degenerative arthrosis of the left knee.   DISCHARGE DIAGNOSIS: Degenerative arthrosis of the left knee.   OPERATION: On 06/05/2013, the patient had a left total knee arthroplasty using computer-aided navigation.   SURGEON: Skip Estimable, MD   ASSISTANT: Vance Peper, PA   ANESTHESIA: Spinal.   ESTIMATED BLOOD LOSS: 250 mL.   TOURNIQUET TIME: 109 minutes.   IMPLANTS USED: DePuy size 4 posterior stabilized femoral component that was cemented, size 4 MBT tibial component (cemented), 38 mm 3 peg oval dome patella that was cemented, 10 mm stabilized rotating platform polyethylene insert.   The patient was stabilized, brought to the recovery room and then brought down to the orthopedic floor.   HISTORY: The patient is a 74 year old female who presented for an upcoming total knee replacement of the left knee. The patient has been refractory to conservative treatment with anti-inflammatories, cortisone injections and activity modification.   PHYSICAL EXAMINATION:  GENERAL: Alert female with an antalgic gait and a varus thrust to the left knee.  LUNGS: Clear to auscultation.  CARDIAC: Regular rate and rhythm with no murmur.  MUSCULOSKELETAL: In regard to the left knee, the patient has medial joint line tenderness with mild laxity medially. The patient has no real effusion. The patient has range of motion from full extension to 109 degrees of flexion.   HOSPITAL COURSE: After initial admission on 06/05/2013, the patient was brought to the orthopedic floor. On postoperative day 1, the patient had a hemoglobin of 10.6, and it dropped down to 10.2 on postoperative day 2. The patient worked with physical therapy, initially bed to chair and was only ambulating about 30 feet on postoperative day 1. The patient did not do well on day 2, with  nausea and difficulty with standing. The patient worked with physical therapy aggressively on postoperative day 3 and was able to go home with home health physical therapy.   CONDITION AT DISCHARGE: Stable.   DISPOSITION: The patient was sent home with home health physical therapy.   DISCHARGE INSTRUCTIONS:  1. The patient will follow up with [facility] on 06/16/2013.  2. The patient will do weight bear as tolerated on the affected leg.  3. The patient will raise her leg and foot with 1 to 2 pillows to decrease swelling.  4. The patient will use thigh-high TED hose on both legs, removed at bedtime.  5. The patient will use an incentive spirometer. The patient will do cough and deep breathing. 6. The patient will use a regular diet.  7. The patient will use Polar Care to decrease swelling.  8. Try to keep her dressing clean and dry while leaving it on. The patient will have a dressing change as needed with physical therapy.  9. The patient will do home health physical therapy, working on gait training and range of motion activities.  10. The patient will call the clinic if there is any bright red bleeding, calf pain, bowel or bladder difficulty or any fever greater than 101.5.   DISCHARGE MEDICATIONS: To resume home medication and to add oxycodone 5 mg 1 tablet q.4 hours as needed for severe pain, tramadol 50 mg 1 tablet q.4 hours p.r.n. for mild to moderate pain, Lovenox 40 mg subcutaneous once a day for 14 days, then discontinue and begin enteric-coated aspirin 81 mg once a day.  ____________________________ Lenna Sciara. Reche Dixon, [place] jtm:lb D: 06/08/2013 06:45:29 ET T: 06/08/2013 08:09:30 ET JOB#: [id number]  cc: J. Reche Dixon, [place], <Dictator>

## 2014-07-07 NOTE — Discharge Summary (Signed)
PATIENT NAME:  Carrie Mills, Carrie Mills MR#:  [id number] DATE OF BIRTH:  Nov 19, 1940  DATE OF ADMISSION:  06/05/2013 DATE OF DISCHARGE:  06/08/2013   ADMITTING DIAGNOSIS: Degenerative arthrosis of the left knee.   DISCHARGE DIAGNOSIS: Degenerative arthrosis of the left knee.   OPERATION: On 06/05/2013, the patient had a left total knee arthroplasty using computer-aided navigation.   SURGEON: Skip Estimable, MD   ASSISTANT: Vance Peper, PA   ANESTHESIA: Spinal.   ESTIMATED BLOOD LOSS: 250 mL.   TOURNIQUET TIME: 109 minutes.   IMPLANTS USED: DePuy size 4 posterior stabilized femoral component that was cemented, size 4 MBT tibial component (cemented), 38 mm 3 peg oval dome patella that was cemented, 10 mm stabilized rotating platform polyethylene insert.   The patient was stabilized, brought to the recovery room and then brought down to the orthopedic floor.   HISTORY: The patient is a 74 year old female who presented for an upcoming total knee replacement of the left knee. The patient has been refractory to conservative treatment with anti-inflammatories, cortisone injections and activity modification.   PHYSICAL EXAMINATION:  GENERAL: Alert female with an antalgic gait and a varus thrust to the left knee.  LUNGS: Clear to auscultation.  CARDIAC: Regular rate and rhythm with no murmur.  MUSCULOSKELETAL: In regard to the left knee, the patient has medial joint line tenderness with mild laxity medially. The patient has no real effusion. The patient has range of motion from full extension to 109 degrees of flexion.   HOSPITAL COURSE: After initial admission on 06/05/2013, the patient was brought to the orthopedic floor. On postoperative day 1, the patient had a hemoglobin of 10.6, and it dropped down to 10.2 on postoperative day 2. The patient worked with physical therapy, initially bed to chair and was only ambulating about 30 feet on postoperative day 1. The patient did not do well on day 2, with  nausea and difficulty with standing. The patient worked with physical therapy aggressively on postoperative day 3 and was able to go home with home health physical therapy.   CONDITION AT DISCHARGE: Stable.   DISPOSITION: The patient was sent home with home health physical therapy.   DISCHARGE INSTRUCTIONS:  1. The patient will follow up with [facility] on 06/16/2013.  2. The patient will do weight bear as tolerated on the affected leg.  3. The patient will raise her leg and foot with 1 to 2 pillows to decrease swelling.  4. The patient will use thigh-high TED hose on both legs, removed at bedtime.  5. The patient will use an incentive spirometer. The patient will do cough and deep breathing. 6. The patient will use a regular diet.  7. The patient will use Polar Care to decrease swelling.  8. Try to keep her dressing clean and dry while leaving it on. The patient will have a dressing change as needed with physical therapy.  9. The patient will do home health physical therapy, working on gait training and range of motion activities.  10. The patient will call the clinic if there is any bright red bleeding, calf pain, bowel or bladder difficulty or any fever greater than 101.5.   DISCHARGE MEDICATIONS: To resume home medication and to add oxycodone 5 mg 1 tablet q.4 hours as needed for severe pain, tramadol 50 mg 1 tablet q.4 hours p.r.n. for mild to moderate pain, Lovenox 40 mg subcutaneous once a day for 14 days, then discontinue and begin enteric-coated aspirin 81 mg once a day.  ____________________________ Lenna Sciara. Reche Dixon, [place] jtm:lb D: 06/08/2013 06:45:00 ET T: 06/08/2013 08:09:30 ET JOB#: [id number]  cc: J. Reche Dixon, [place], <Dictator> J Zareen [facility] PA ELECTRONICALLY SIGNED 06/28/2013 6:37

## 2014-07-16 ENCOUNTER — Other Ambulatory Visit: Payer: Self-pay | Admitting: Internal Medicine

## 2014-07-16 NOTE — Telephone Encounter (Signed)
Last OV 2.19.16, last refill 1.19.16.  Please advise refill

## 2014-07-16 NOTE — Telephone Encounter (Signed)
Refilled citalopram #90 with one refill.

## 2014-07-30 ENCOUNTER — Other Ambulatory Visit: Payer: Self-pay | Admitting: Internal Medicine

## 2014-08-14 DIAGNOSIS — M5136 Other intervertebral disc degeneration, lumbar region: Secondary | ICD-10-CM | POA: Diagnosis not present

## 2014-08-14 DIAGNOSIS — M791 Myalgia: Secondary | ICD-10-CM | POA: Diagnosis not present

## 2014-08-14 DIAGNOSIS — M545 Low back pain: Secondary | ICD-10-CM | POA: Diagnosis not present

## 2014-08-14 DIAGNOSIS — M9905 Segmental and somatic dysfunction of pelvic region: Secondary | ICD-10-CM | POA: Diagnosis not present

## 2014-08-14 DIAGNOSIS — M5387 Other specified dorsopathies, lumbosacral region: Secondary | ICD-10-CM | POA: Diagnosis not present

## 2014-08-14 DIAGNOSIS — M9903 Segmental and somatic dysfunction of lumbar region: Secondary | ICD-10-CM | POA: Diagnosis not present

## 2014-08-14 DIAGNOSIS — M9904 Segmental and somatic dysfunction of sacral region: Secondary | ICD-10-CM | POA: Diagnosis not present

## 2014-08-19 ENCOUNTER — Other Ambulatory Visit: Payer: Self-pay | Admitting: Internal Medicine

## 2014-09-10 ENCOUNTER — Other Ambulatory Visit (INDEPENDENT_AMBULATORY_CARE_PROVIDER_SITE_OTHER): Payer: Medicare Other

## 2014-09-10 DIAGNOSIS — E7800 Pure hypercholesterolemia, unspecified: Secondary | ICD-10-CM

## 2014-09-10 DIAGNOSIS — I10 Essential (primary) hypertension: Secondary | ICD-10-CM | POA: Diagnosis not present

## 2014-09-10 DIAGNOSIS — E119 Type 2 diabetes mellitus without complications: Secondary | ICD-10-CM | POA: Diagnosis not present

## 2014-09-10 LAB — BASIC METABOLIC PANEL
BUN: 15 mg/dL (ref 6–23)
CALCIUM: 10.2 mg/dL (ref 8.4–10.5)
CO2: 35 mEq/L — ABNORMAL HIGH (ref 19–32)
CREATININE: 1.06 mg/dL (ref 0.40–1.20)
Chloride: 100 mEq/L (ref 96–112)
GFR: 53.62 mL/min — AB (ref >60.00)
Glucose, Bld: 112 mg/dL — ABNORMAL HIGH (ref 70–99)
Potassium: 4.4 mEq/L (ref 3.5–5.1)
SODIUM: 137 meq/L (ref 135–145)

## 2014-09-10 LAB — HEPATIC FUNCTION PANEL
ALT: 22 U/L (ref 0–35)
AST: 23 U/L (ref 0–37)
Albumin: 4 g/dL (ref 3.5–5.2)
Alkaline Phosphatase: 45 U/L (ref 39–117)
BILIRUBIN TOTAL: 1 mg/dL (ref 0.2–1.2)
Bilirubin, Direct: 0.1 mg/dL (ref 0.0–0.3)
TOTAL PROTEIN: 6.9 g/dL (ref 6.0–8.3)

## 2014-09-10 LAB — LIPID PANEL
CHOL/HDL RATIO: 4
Cholesterol: 161 mg/dL (ref 0–200)
HDL: 36.1 mg/dL — ABNORMAL LOW (ref >39.00)
NonHDL: 124.9
Triglycerides: 208 mg/dL — ABNORMAL HIGH (ref 0.0–149.0)
VLDL: 41.6 mg/dL — AB (ref 0.0–40.0)

## 2014-09-10 LAB — LDL CHOLESTEROL, DIRECT: LDL DIRECT: 91 mg/dL

## 2014-09-10 LAB — HEMOGLOBIN A1C: Hgb A1c MFr Bld: 6.4 % (ref 4.6–6.5)

## 2014-09-12 ENCOUNTER — Encounter: Payer: Self-pay | Admitting: Internal Medicine

## 2014-09-12 ENCOUNTER — Ambulatory Visit (INDEPENDENT_AMBULATORY_CARE_PROVIDER_SITE_OTHER): Payer: Medicare Other | Admitting: Internal Medicine

## 2014-09-12 VITALS — BP 106/64 | HR 65 | Temp 98.1°F | Ht 65.0 in | Wt 211.4 lb

## 2014-09-12 DIAGNOSIS — E7800 Pure hypercholesterolemia, unspecified: Secondary | ICD-10-CM

## 2014-09-12 DIAGNOSIS — E119 Type 2 diabetes mellitus without complications: Secondary | ICD-10-CM | POA: Diagnosis not present

## 2014-09-12 DIAGNOSIS — I10 Essential (primary) hypertension: Secondary | ICD-10-CM | POA: Diagnosis not present

## 2014-09-12 DIAGNOSIS — Z86010 Personal history of colonic polyps: Secondary | ICD-10-CM

## 2014-09-12 DIAGNOSIS — I8393 Asymptomatic varicose veins of bilateral lower extremities: Secondary | ICD-10-CM

## 2014-09-12 DIAGNOSIS — F439 Reaction to severe stress, unspecified: Secondary | ICD-10-CM

## 2014-09-12 DIAGNOSIS — Z658 Other specified problems related to psychosocial circumstances: Secondary | ICD-10-CM

## 2014-09-12 DIAGNOSIS — K649 Unspecified hemorrhoids: Secondary | ICD-10-CM

## 2014-09-12 DIAGNOSIS — Z0000 Encounter for general adult medical examination without abnormal findings: Secondary | ICD-10-CM

## 2014-09-12 MED ORDER — HYDROCORTISONE ACE-PRAMOXINE 1-1 % RE CREA: 1.0000 "application " | TOPICAL_CREAM | Freq: Two times a day (BID) | RECTAL | Status: DC

## 2014-09-12 NOTE — Progress Notes (Signed)
Pre visit review using our clinic review tool, if applicable. No additional management support is needed unless otherwise documented below in the visit note. 

## 2014-09-12 NOTE — Progress Notes (Signed)
Patient ID: Carrie Mills, female   DOB: [birth year], 74 y.o.   MRN: [id number]   Subjective:    Patient ID: Carrie Mills, female    DOB: 1940/08/19, 74 y.o.   MRN: [id number]  HPI  Patient here for a scheduled follow up.  Increased stress with her son's medical issues.  Does not feel she needs anything more at this point.  Stays active.  Is exercising.  No cardiac symptoms with increased activity or exertion.  No sob.  No nausea or vomiting.  Bowels stable.  Having some intermittent problems with hemorrhoids.  Occurs when has 2-3 bowel movements in a day.  Overall bowels dong well.     Past Medical History  Diagnosis Date  . Allergy   . Hyperlipidemia   . Colon polyps   . Phlebitis   . Hyperglycemia   . Hypertension   . Varicose veins     Current Outpatient Prescriptions on File Prior to Visit  Medication Sig Dispense Refill  . ACCU-CHEK AVIVA PLUS test strip TEST BLOOD SUGAR ONCE A DAY OR AS INSTRUCTED. DX 250.0 100 each 6  . budesonide-formoterol (SYMBICORT) 160-4.5 MCG/ACT inhaler Inhale 2 puffs into the lungs daily.    . Calcium Carbonate Antacid 400 MG CHEW Chew by mouth daily.    . citalopram (CELEXA) 10 MG tablet TAKE 1 TABLET DAILY 90 tablet 1  . Coconut Oil 1000 MG CAPS Take by mouth.    . fluticasone (FLONASE) 50 MCG/ACT nasal spray     . Glucosamine HCl 1000 MG TABS Take by mouth.    . hydrochlorothiazide (HYDRODIURIL) 25 MG tablet TAKE 1 TABLET DAILY 90 tablet 1  . metoprolol succinate (TOPROL-XL) 25 MG 24 hr tablet TAKE 1 TABLET TWICE A DAY 180 tablet 1  . mometasone (ELOCON) 0.1 % lotion Use as directed 60 mL 0  . Montelukast Sodium (SINGULAIR PO) Take by mouth daily.    . Multiple Vitamin (MULTIVITAMIN) capsule Take 1 capsule by mouth daily.    . Omega-3 Fatty Acids (FISH OIL) 1200 MG CAPS Take by mouth daily.    . pravastatin (PRAVACHOL) 40 MG tablet TAKE 1 TABLET BY MOUTH DAILY. 90 tablet 1  . sodium chloride (OCEAN) 0.65 % nasal spray Place 1 spray into  the nose as needed.    . triamcinolone (NASACORT) 55 MCG/ACT nasal inhaler Place 2 sprays into the nose daily.     No current facility-administered medications on file prior to visit.    Review of Systems  Constitutional: Negative for appetite change and unexpected weight change.  HENT: Negative for congestion and sinus pressure.   Respiratory: Negative for cough, chest tightness and shortness of breath.   Cardiovascular: Negative for chest pain, palpitations and leg swelling (wears compression hose. ).  Gastrointestinal: Negative for nausea, vomiting, abdominal pain and diarrhea.       Intermittent flares - hemorrhoids.    Skin: Negative for color change and rash.  Neurological: Negative for dizziness, light-headedness and headaches.  Psychiatric/Behavioral: Negative for dysphoric mood and agitation.       Objective:    Physical Exam  Constitutional: She appears well-developed and well-nourished. No distress.  HENT:  Nose: Nose normal.  Mouth/Throat: Oropharynx is clear and moist.  Neck: Neck supple. No thyromegaly present.  Cardiovascular: Normal rate and regular rhythm.   Pulmonary/Chest: Breath sounds normal. No respiratory distress. She has no wheezes.  Abdominal: Soft. Bowel sounds are normal. There is no tenderness.  Musculoskeletal: She exhibits no edema or  tenderness.  Lymphadenopathy:    She has no cervical adenopathy.  Skin: No rash noted. No erythema.  Psychiatric: She has a normal mood and affect. Her behavior is normal.    BP 106/64 mmHg  Pulse 65  Temp(Src) 98.1 F (36.7 C) (Oral)  Ht 5' 5"  (1.651 m)  Wt 211 lb 6 oz (95.879 kg)  BMI 35.17 kg/m2  SpO2 95% Wt Readings from Last 3 Encounters:  09/12/14 211 lb 6 oz (95.879 kg)  05/04/14 206 lb 8 oz (93.668 kg)  12/08/13 195 lb 8 oz (88.678 kg)     Lab Results  Component Value Date   WBC 8.0 09/25/2013   HGB 13.0 09/25/2013   HCT 39.7 09/25/2013   PLT 258.0 09/25/2013   GLUCOSE 112* 09/10/2014    CHOL 161 09/10/2014   TRIG 208.0* 09/10/2014   HDL 36.10* 09/10/2014   LDLDIRECT 91.0 09/10/2014   LDLCALC 95 05/01/2014   ALT 22 09/10/2014   AST 23 09/10/2014   NA 137 09/10/2014   K 4.4 09/10/2014   CL 100 09/10/2014   CREATININE 1.06 09/10/2014   BUN 15 09/10/2014   CO2 35* 09/10/2014   TSH 2.77 09/25/2013   INR 0.9 05/24/2013   HGBA1C 6.4 09/10/2014   MICROALBUR 0.4 09/25/2013       Assessment & Plan:   Problem List Items Addressed This Visit    Asymptomatic varicose veins - Primary    Compression hose.       Diabetes    Diet and exercise.  a1c just checked 6.4.  Follow met b and a1c.       Relevant Orders   CBC with Differential/Platelet   TSH   Hemoglobin A1c   Microalbumin / creatinine urine ratio   Health care maintenance    Physical 05/04/14.  Mammogram 05/10/14 - Birads I.  Colonoscopy 12/27/12 - normal.  Recommended f/u colonoscopy five years.        Hemorrhoids    analpram as directed.  Declined exam.       History of colonic polyps    Colonoscopy 12/27/12 - normal.  Recommended f/u colonoscopy in five years.       Hypercholesterolemia    Low cholesterol diet and exercise.  Follow lipid panel.       Relevant Orders   Lipid panel   Hepatic function panel   Hypertension    Blood pressure has been dong well.  Follow pressures.  Follow metabolic panel.  Same medication regimen.       Relevant Orders   Basic metabolic panel   Stress    Desires no further intervention at this point.  Follow.           Einar Pheasant, MD

## 2014-09-14 ENCOUNTER — Encounter: Payer: Self-pay | Admitting: Internal Medicine

## 2014-09-14 DIAGNOSIS — F439 Reaction to severe stress, unspecified: Secondary | ICD-10-CM | POA: Insufficient documentation

## 2014-09-14 DIAGNOSIS — K649 Unspecified hemorrhoids: Secondary | ICD-10-CM | POA: Insufficient documentation

## 2014-09-14 NOTE — Assessment & Plan Note (Signed)
Blood pressure has been dong well.  Follow pressures.  Follow metabolic panel.  Same medication regimen.

## 2014-09-14 NOTE — Assessment & Plan Note (Signed)
Physical 05/04/14.  Mammogram 05/10/14 - Birads I.  Colonoscopy 12/27/12 - normal.  Recommended f/u colonoscopy five years.

## 2014-09-14 NOTE — Assessment & Plan Note (Signed)
Desires no further intervention at this point.  Follow.

## 2014-09-14 NOTE — Assessment & Plan Note (Signed)
Low cholesterol diet and exercise.  Follow lipid panel.   

## 2014-09-14 NOTE — Assessment & Plan Note (Signed)
Colonoscopy 12/27/12 - normal.  Recommended f/u colonoscopy in five years.

## 2014-09-14 NOTE — Assessment & Plan Note (Signed)
Compression hose.   

## 2014-09-14 NOTE — Assessment & Plan Note (Signed)
Diet and exercise.  a1c just checked 6.4.  Follow met b and a1c.  

## 2014-09-14 NOTE — Assessment & Plan Note (Signed)
analpram as directed.  Declined exam.

## 2014-09-28 ENCOUNTER — Encounter: Payer: Self-pay | Admitting: *Deleted

## 2014-10-18 DIAGNOSIS — M9904 Segmental and somatic dysfunction of sacral region: Secondary | ICD-10-CM | POA: Diagnosis not present

## 2014-10-18 DIAGNOSIS — M9905 Segmental and somatic dysfunction of pelvic region: Secondary | ICD-10-CM | POA: Diagnosis not present

## 2014-10-18 DIAGNOSIS — M9903 Segmental and somatic dysfunction of lumbar region: Secondary | ICD-10-CM | POA: Diagnosis not present

## 2014-10-18 DIAGNOSIS — M545 Low back pain: Secondary | ICD-10-CM | POA: Diagnosis not present

## 2014-10-18 DIAGNOSIS — M5387 Other specified dorsopathies, lumbosacral region: Secondary | ICD-10-CM | POA: Diagnosis not present

## 2014-10-18 DIAGNOSIS — M5136 Other intervertebral disc degeneration, lumbar region: Secondary | ICD-10-CM | POA: Diagnosis not present

## 2014-10-18 DIAGNOSIS — M791 Myalgia: Secondary | ICD-10-CM | POA: Diagnosis not present

## 2014-10-24 ENCOUNTER — Other Ambulatory Visit: Payer: Self-pay | Admitting: Internal Medicine

## 2014-10-27 ENCOUNTER — Other Ambulatory Visit: Payer: Self-pay | Admitting: Internal Medicine

## 2014-11-15 ENCOUNTER — Encounter: Payer: Self-pay | Admitting: General Surgery

## 2014-11-15 ENCOUNTER — Ambulatory Visit (INDEPENDENT_AMBULATORY_CARE_PROVIDER_SITE_OTHER): Payer: Medicare Other | Admitting: General Surgery

## 2014-11-15 VITALS — BP 126/60 | Ht 65.0 in | Wt 212.0 lb

## 2014-11-15 DIAGNOSIS — I8393 Asymptomatic varicose veins of bilateral lower extremities: Secondary | ICD-10-CM | POA: Diagnosis not present

## 2014-11-15 NOTE — Patient Instructions (Addendum)
The patient is aware to call back for any questions or concerns. Continue with the use of compression hose. Follow up in one year. Marland Kitchen

## 2014-11-15 NOTE — Progress Notes (Signed)
Patient ID: Carrie Mills, female   DOB: Mar 06, 1941, 74 y.o.   MRN: [id number]  Chief Complaint  Patient presents with  . Follow-up    HPI Carrie Mills is a 74 y.o. female.  female who presents for a follow up of varicose veins. She denies any problems with her legs at this time. She is using her compression hose on a daily basis.   HPI  Past Medical History  Diagnosis Date  . Allergy   . Hyperlipidemia   . Colon polyps   . Phlebitis   . Hyperglycemia   . Hypertension   . Varicose veins     Past Surgical History  Procedure Laterality Date  . Appendectomy  1987  . Total abdominal hysterectomy w/ bilateral salpingoophorectomy  1987  . Abdominal hysterectomy  1987  . Thrombectomy    . Colectomy  2004  . Colonoscopy  2009  . Replacement total knee Left 06/05/13  . Colonoscopy  12-27-12    Dr Jamal Collin    Family History  Problem Relation Age of Onset  . Arthritis Mother   . Heart disease Mother     s/p CABG  . Hypertension Mother   . Heart disease Father   . Hypertension Father   . Diabetes Father   . Breast cancer Neg Hx   . Colon cancer Neg Hx     Social History Social History  Substance Use Topics  . Smoking status: Never Smoker   . Smokeless tobacco: Never Used  . Alcohol Use: No    Allergies  Allergen Reactions  . Sulfa Antibiotics Hives and Other (See Comments)    Fainting    Current Outpatient Prescriptions  Medication Sig Dispense Refill  . ACCU-CHEK AVIVA PLUS test strip TEST BLOOD SUGAR ONCE A DAY OR AS INSTRUCTED. DX 250.0 100 each 6  . budesonide-formoterol (SYMBICORT) 160-4.5 MCG/ACT inhaler Inhale 2 puffs into the lungs daily.    . Calcium Carbonate Antacid 400 MG CHEW Chew by mouth daily.    . citalopram (CELEXA) 10 MG tablet TAKE 1 TABLET DAILY 90 tablet 1  . Coconut Oil 1000 MG CAPS Take by mouth.    . fluticasone (FLONASE) 50 MCG/ACT nasal spray     . Glucosamine HCl 1000 MG TABS Take by mouth.    . hydrochlorothiazide  (HYDRODIURIL) 25 MG tablet TAKE 1 TABLET DAILY 90 tablet 1  . metoprolol succinate (TOPROL-XL) 25 MG 24 hr tablet TAKE 1 TABLET TWICE A DAY 180 tablet 1  . mometasone (ELOCON) 0.1 % lotion Use as directed 60 mL 0  . Montelukast Sodium (SINGULAIR PO) Take by mouth daily.    . Multiple Vitamin (MULTIVITAMIN) capsule Take 1 capsule by mouth daily.    . Omega-3 Fatty Acids (FISH OIL) 1200 MG CAPS Take by mouth daily.    . pramoxine-hydrocortisone (ANALPRAM-HC) 1-1 % rectal cream Place 1 application rectally 2 (two) times daily. 30 g 0  . pravastatin (PRAVACHOL) 40 MG tablet TAKE 1 TABLET BY MOUTH DAILY. 90 tablet 1  . sodium chloride (OCEAN) 0.65 % nasal spray Place 1 spray into the nose as needed.    . triamcinolone (NASACORT) 55 MCG/ACT nasal inhaler Place 2 sprays into the nose daily.     No current facility-administered medications for this visit.    Review of Systems Review of Systems  Constitutional: Negative.   Respiratory: Negative.   Cardiovascular: Negative.     Blood pressure 126/60, height 5\' 5"  (1.651 m), weight 212 lb (96.163 kg).  Physical Exam Physical Exam  Constitutional: She is oriented to person, place, and time. She appears well-developed and well-nourished.  HENT:  Mouth/Throat: Oropharynx is clear and moist.  Eyes: Conjunctivae are normal. No scleral icterus.  Neck: Neck supple.  Cardiovascular: Normal rate, regular rhythm and normal heart sounds.   Pulses:      Dorsalis pedis pulses are 2+ on the right side, and 2+ on the left side.       Posterior tibial pulses are 2+ on the right side, and 2+ on the left side.  Mild edema left leg. Skin change inner aspect left ankle which in chronic.  Pulmonary/Chest: Effort normal and breath sounds normal.  Abdominal: Soft. There is no tenderness.  Lymphadenopathy:    She has no cervical adenopathy.  Neurological: She is alert and oriented to person, place, and time.  Skin: Skin is warm.  Psychiatric: Her behavior is  normal.    Data Reviewed none  Assessment    Stable varicose veins lower extremities. She uses compression hose daily and has had excellent control for many years now.    Plan    Continue with the use of compression hose. Follow up in one year. The patient is aware to call back for any questions or concerns.      PCP:  Derry Skill 11/15/2014, 1:55 PM

## 2014-11-29 DIAGNOSIS — M791 Myalgia: Secondary | ICD-10-CM | POA: Diagnosis not present

## 2014-11-29 DIAGNOSIS — M5136 Other intervertebral disc degeneration, lumbar region: Secondary | ICD-10-CM | POA: Diagnosis not present

## 2014-11-29 DIAGNOSIS — M9905 Segmental and somatic dysfunction of pelvic region: Secondary | ICD-10-CM | POA: Diagnosis not present

## 2014-11-29 DIAGNOSIS — M9903 Segmental and somatic dysfunction of lumbar region: Secondary | ICD-10-CM | POA: Diagnosis not present

## 2014-11-29 DIAGNOSIS — M5441 Lumbago with sciatica, right side: Secondary | ICD-10-CM | POA: Diagnosis not present

## 2014-11-29 DIAGNOSIS — M9904 Segmental and somatic dysfunction of sacral region: Secondary | ICD-10-CM | POA: Diagnosis not present

## 2014-12-10 DIAGNOSIS — M5441 Lumbago with sciatica, right side: Secondary | ICD-10-CM | POA: Diagnosis not present

## 2014-12-10 DIAGNOSIS — M9903 Segmental and somatic dysfunction of lumbar region: Secondary | ICD-10-CM | POA: Diagnosis not present

## 2014-12-10 DIAGNOSIS — M9905 Segmental and somatic dysfunction of pelvic region: Secondary | ICD-10-CM | POA: Diagnosis not present

## 2014-12-10 DIAGNOSIS — M791 Myalgia: Secondary | ICD-10-CM | POA: Diagnosis not present

## 2014-12-10 DIAGNOSIS — M9904 Segmental and somatic dysfunction of sacral region: Secondary | ICD-10-CM | POA: Diagnosis not present

## 2014-12-10 DIAGNOSIS — M5136 Other intervertebral disc degeneration, lumbar region: Secondary | ICD-10-CM | POA: Diagnosis not present

## 2014-12-11 DIAGNOSIS — M5441 Lumbago with sciatica, right side: Secondary | ICD-10-CM | POA: Diagnosis not present

## 2014-12-11 DIAGNOSIS — M791 Myalgia: Secondary | ICD-10-CM | POA: Diagnosis not present

## 2014-12-11 DIAGNOSIS — M9905 Segmental and somatic dysfunction of pelvic region: Secondary | ICD-10-CM | POA: Diagnosis not present

## 2014-12-11 DIAGNOSIS — M5136 Other intervertebral disc degeneration, lumbar region: Secondary | ICD-10-CM | POA: Diagnosis not present

## 2014-12-11 DIAGNOSIS — M9904 Segmental and somatic dysfunction of sacral region: Secondary | ICD-10-CM | POA: Diagnosis not present

## 2014-12-11 DIAGNOSIS — M9903 Segmental and somatic dysfunction of lumbar region: Secondary | ICD-10-CM | POA: Diagnosis not present

## 2014-12-13 DIAGNOSIS — M5136 Other intervertebral disc degeneration, lumbar region: Secondary | ICD-10-CM | POA: Diagnosis not present

## 2014-12-13 DIAGNOSIS — M9903 Segmental and somatic dysfunction of lumbar region: Secondary | ICD-10-CM | POA: Diagnosis not present

## 2014-12-13 DIAGNOSIS — M9904 Segmental and somatic dysfunction of sacral region: Secondary | ICD-10-CM | POA: Diagnosis not present

## 2014-12-13 DIAGNOSIS — M5441 Lumbago with sciatica, right side: Secondary | ICD-10-CM | POA: Diagnosis not present

## 2014-12-13 DIAGNOSIS — M791 Myalgia: Secondary | ICD-10-CM | POA: Diagnosis not present

## 2014-12-13 DIAGNOSIS — M9905 Segmental and somatic dysfunction of pelvic region: Secondary | ICD-10-CM | POA: Diagnosis not present

## 2014-12-17 DIAGNOSIS — M791 Myalgia: Secondary | ICD-10-CM | POA: Diagnosis not present

## 2014-12-17 DIAGNOSIS — M9904 Segmental and somatic dysfunction of sacral region: Secondary | ICD-10-CM | POA: Diagnosis not present

## 2014-12-17 DIAGNOSIS — M5441 Lumbago with sciatica, right side: Secondary | ICD-10-CM | POA: Diagnosis not present

## 2014-12-17 DIAGNOSIS — M9903 Segmental and somatic dysfunction of lumbar region: Secondary | ICD-10-CM | POA: Diagnosis not present

## 2014-12-17 DIAGNOSIS — M9905 Segmental and somatic dysfunction of pelvic region: Secondary | ICD-10-CM | POA: Diagnosis not present

## 2014-12-17 DIAGNOSIS — M5136 Other intervertebral disc degeneration, lumbar region: Secondary | ICD-10-CM | POA: Diagnosis not present

## 2014-12-19 DIAGNOSIS — M791 Myalgia: Secondary | ICD-10-CM | POA: Diagnosis not present

## 2014-12-19 DIAGNOSIS — M5136 Other intervertebral disc degeneration, lumbar region: Secondary | ICD-10-CM | POA: Diagnosis not present

## 2014-12-19 DIAGNOSIS — M9905 Segmental and somatic dysfunction of pelvic region: Secondary | ICD-10-CM | POA: Diagnosis not present

## 2014-12-19 DIAGNOSIS — M9903 Segmental and somatic dysfunction of lumbar region: Secondary | ICD-10-CM | POA: Diagnosis not present

## 2014-12-19 DIAGNOSIS — M9904 Segmental and somatic dysfunction of sacral region: Secondary | ICD-10-CM | POA: Diagnosis not present

## 2014-12-19 DIAGNOSIS — M5441 Lumbago with sciatica, right side: Secondary | ICD-10-CM | POA: Diagnosis not present

## 2014-12-20 DIAGNOSIS — M791 Myalgia: Secondary | ICD-10-CM | POA: Diagnosis not present

## 2014-12-20 DIAGNOSIS — M9905 Segmental and somatic dysfunction of pelvic region: Secondary | ICD-10-CM | POA: Diagnosis not present

## 2014-12-20 DIAGNOSIS — M9903 Segmental and somatic dysfunction of lumbar region: Secondary | ICD-10-CM | POA: Diagnosis not present

## 2014-12-20 DIAGNOSIS — M9904 Segmental and somatic dysfunction of sacral region: Secondary | ICD-10-CM | POA: Diagnosis not present

## 2014-12-20 DIAGNOSIS — M5136 Other intervertebral disc degeneration, lumbar region: Secondary | ICD-10-CM | POA: Diagnosis not present

## 2014-12-20 DIAGNOSIS — M5441 Lumbago with sciatica, right side: Secondary | ICD-10-CM | POA: Diagnosis not present

## 2014-12-24 DIAGNOSIS — M791 Myalgia: Secondary | ICD-10-CM | POA: Diagnosis not present

## 2014-12-24 DIAGNOSIS — M9904 Segmental and somatic dysfunction of sacral region: Secondary | ICD-10-CM | POA: Diagnosis not present

## 2014-12-24 DIAGNOSIS — M9903 Segmental and somatic dysfunction of lumbar region: Secondary | ICD-10-CM | POA: Diagnosis not present

## 2014-12-24 DIAGNOSIS — M9905 Segmental and somatic dysfunction of pelvic region: Secondary | ICD-10-CM | POA: Diagnosis not present

## 2014-12-24 DIAGNOSIS — M5441 Lumbago with sciatica, right side: Secondary | ICD-10-CM | POA: Diagnosis not present

## 2014-12-24 DIAGNOSIS — M5136 Other intervertebral disc degeneration, lumbar region: Secondary | ICD-10-CM | POA: Diagnosis not present

## 2014-12-25 DIAGNOSIS — M5136 Other intervertebral disc degeneration, lumbar region: Secondary | ICD-10-CM | POA: Diagnosis not present

## 2014-12-25 DIAGNOSIS — M9903 Segmental and somatic dysfunction of lumbar region: Secondary | ICD-10-CM | POA: Diagnosis not present

## 2014-12-25 DIAGNOSIS — M9904 Segmental and somatic dysfunction of sacral region: Secondary | ICD-10-CM | POA: Diagnosis not present

## 2014-12-25 DIAGNOSIS — M9905 Segmental and somatic dysfunction of pelvic region: Secondary | ICD-10-CM | POA: Diagnosis not present

## 2014-12-25 DIAGNOSIS — M5441 Lumbago with sciatica, right side: Secondary | ICD-10-CM | POA: Diagnosis not present

## 2014-12-25 DIAGNOSIS — M791 Myalgia: Secondary | ICD-10-CM | POA: Diagnosis not present

## 2014-12-27 DIAGNOSIS — M5441 Lumbago with sciatica, right side: Secondary | ICD-10-CM | POA: Diagnosis not present

## 2014-12-27 DIAGNOSIS — M791 Myalgia: Secondary | ICD-10-CM | POA: Diagnosis not present

## 2014-12-27 DIAGNOSIS — M9903 Segmental and somatic dysfunction of lumbar region: Secondary | ICD-10-CM | POA: Diagnosis not present

## 2014-12-27 DIAGNOSIS — M9905 Segmental and somatic dysfunction of pelvic region: Secondary | ICD-10-CM | POA: Diagnosis not present

## 2014-12-27 DIAGNOSIS — M5136 Other intervertebral disc degeneration, lumbar region: Secondary | ICD-10-CM | POA: Diagnosis not present

## 2014-12-27 DIAGNOSIS — M9904 Segmental and somatic dysfunction of sacral region: Secondary | ICD-10-CM | POA: Diagnosis not present

## 2014-12-31 DIAGNOSIS — M9903 Segmental and somatic dysfunction of lumbar region: Secondary | ICD-10-CM | POA: Diagnosis not present

## 2014-12-31 DIAGNOSIS — M9905 Segmental and somatic dysfunction of pelvic region: Secondary | ICD-10-CM | POA: Diagnosis not present

## 2014-12-31 DIAGNOSIS — M5441 Lumbago with sciatica, right side: Secondary | ICD-10-CM | POA: Diagnosis not present

## 2014-12-31 DIAGNOSIS — M9904 Segmental and somatic dysfunction of sacral region: Secondary | ICD-10-CM | POA: Diagnosis not present

## 2014-12-31 DIAGNOSIS — M791 Myalgia: Secondary | ICD-10-CM | POA: Diagnosis not present

## 2014-12-31 DIAGNOSIS — M5136 Other intervertebral disc degeneration, lumbar region: Secondary | ICD-10-CM | POA: Diagnosis not present

## 2015-01-01 DIAGNOSIS — M9905 Segmental and somatic dysfunction of pelvic region: Secondary | ICD-10-CM | POA: Diagnosis not present

## 2015-01-01 DIAGNOSIS — M9904 Segmental and somatic dysfunction of sacral region: Secondary | ICD-10-CM | POA: Diagnosis not present

## 2015-01-01 DIAGNOSIS — M5441 Lumbago with sciatica, right side: Secondary | ICD-10-CM | POA: Diagnosis not present

## 2015-01-01 DIAGNOSIS — M9903 Segmental and somatic dysfunction of lumbar region: Secondary | ICD-10-CM | POA: Diagnosis not present

## 2015-01-01 DIAGNOSIS — M791 Myalgia: Secondary | ICD-10-CM | POA: Diagnosis not present

## 2015-01-01 DIAGNOSIS — M5136 Other intervertebral disc degeneration, lumbar region: Secondary | ICD-10-CM | POA: Diagnosis not present

## 2015-01-02 DIAGNOSIS — Z23 Encounter for immunization: Secondary | ICD-10-CM | POA: Diagnosis not present

## 2015-01-03 DIAGNOSIS — M5136 Other intervertebral disc degeneration, lumbar region: Secondary | ICD-10-CM | POA: Diagnosis not present

## 2015-01-03 DIAGNOSIS — M9904 Segmental and somatic dysfunction of sacral region: Secondary | ICD-10-CM | POA: Diagnosis not present

## 2015-01-03 DIAGNOSIS — M9905 Segmental and somatic dysfunction of pelvic region: Secondary | ICD-10-CM | POA: Diagnosis not present

## 2015-01-03 DIAGNOSIS — M5441 Lumbago with sciatica, right side: Secondary | ICD-10-CM | POA: Diagnosis not present

## 2015-01-03 DIAGNOSIS — M9903 Segmental and somatic dysfunction of lumbar region: Secondary | ICD-10-CM | POA: Diagnosis not present

## 2015-01-03 DIAGNOSIS — M791 Myalgia: Secondary | ICD-10-CM | POA: Diagnosis not present

## 2015-01-07 ENCOUNTER — Other Ambulatory Visit (INDEPENDENT_AMBULATORY_CARE_PROVIDER_SITE_OTHER): Payer: Medicare Other

## 2015-01-07 DIAGNOSIS — M9903 Segmental and somatic dysfunction of lumbar region: Secondary | ICD-10-CM | POA: Diagnosis not present

## 2015-01-07 DIAGNOSIS — I10 Essential (primary) hypertension: Secondary | ICD-10-CM | POA: Diagnosis not present

## 2015-01-07 DIAGNOSIS — M9904 Segmental and somatic dysfunction of sacral region: Secondary | ICD-10-CM | POA: Diagnosis not present

## 2015-01-07 DIAGNOSIS — E7800 Pure hypercholesterolemia, unspecified: Secondary | ICD-10-CM | POA: Diagnosis not present

## 2015-01-07 DIAGNOSIS — M9905 Segmental and somatic dysfunction of pelvic region: Secondary | ICD-10-CM | POA: Diagnosis not present

## 2015-01-07 DIAGNOSIS — M791 Myalgia: Secondary | ICD-10-CM | POA: Diagnosis not present

## 2015-01-07 DIAGNOSIS — E119 Type 2 diabetes mellitus without complications: Secondary | ICD-10-CM

## 2015-01-07 DIAGNOSIS — M5441 Lumbago with sciatica, right side: Secondary | ICD-10-CM | POA: Diagnosis not present

## 2015-01-07 DIAGNOSIS — M5136 Other intervertebral disc degeneration, lumbar region: Secondary | ICD-10-CM | POA: Diagnosis not present

## 2015-01-07 LAB — CBC WITH DIFFERENTIAL/PLATELET
BASOS PCT: 0.6 % (ref 0.0–3.0)
Basophils Absolute: 0.1 10*3/uL (ref 0.0–0.1)
EOS ABS: 0.6 10*3/uL (ref 0.0–0.7)
Eosinophils Relative: 7.6 % — ABNORMAL HIGH (ref 0.0–5.0)
HEMATOCRIT: 40 % (ref 36.0–46.0)
HEMOGLOBIN: 13.2 g/dL (ref 12.0–15.0)
Lymphocytes Relative: 31.8 % (ref 12.0–46.0)
Lymphs Abs: 2.7 10*3/uL (ref 0.7–4.0)
MCHC: 33 g/dL (ref 30.0–36.0)
MCV: 91 fl (ref 78.0–100.0)
MONO ABS: 0.7 10*3/uL (ref 0.1–1.0)
Monocytes Relative: 8.5 % (ref 3.0–12.0)
Neutro Abs: 4.4 10*3/uL (ref 1.4–7.7)
Neutrophils Relative %: 51.5 % (ref 43.0–77.0)
Platelets: 250 10*3/uL (ref 150.0–400.0)
RBC: 4.39 Mil/uL (ref 3.87–5.11)
RDW: 13.7 % (ref 11.5–15.5)
WBC: 8.5 10*3/uL (ref 4.0–10.5)

## 2015-01-07 LAB — BASIC METABOLIC PANEL
BUN: 15 mg/dL (ref 6–23)
CHLORIDE: 99 meq/L (ref 96–112)
CO2: 31 meq/L (ref 19–32)
CREATININE: 0.98 mg/dL (ref 0.40–1.20)
Calcium: 9.9 mg/dL (ref 8.4–10.5)
GFR: 58.65 mL/min — ABNORMAL LOW (ref >60.00)
Glucose, Bld: 119 mg/dL — ABNORMAL HIGH (ref 70–99)
Potassium: 4.2 mEq/L (ref 3.5–5.1)
Sodium: 137 mEq/L (ref 135–145)

## 2015-01-07 LAB — TSH: TSH: 2.48 u[IU]/mL (ref 0.35–4.50)

## 2015-01-07 LAB — HEPATIC FUNCTION PANEL
ALBUMIN: 3.9 g/dL (ref 3.5–5.2)
ALT: 29 U/L (ref 0–35)
AST: 26 U/L (ref 0–37)
Alkaline Phosphatase: 43 U/L (ref 39–117)
Bilirubin, Direct: 0.1 mg/dL (ref 0.0–0.3)
TOTAL PROTEIN: 6.6 g/dL (ref 6.0–8.3)
Total Bilirubin: 0.7 mg/dL (ref 0.2–1.2)

## 2015-01-07 LAB — LIPID PANEL
CHOLESTEROL: 157 mg/dL (ref 0–200)
HDL: 38.3 mg/dL — ABNORMAL LOW (ref >39.00)
LDL Cholesterol: 85 mg/dL (ref 0–99)
NONHDL: 118.81
TRIGLYCERIDES: 170 mg/dL — AB (ref 0.0–149.0)
Total CHOL/HDL Ratio: 4
VLDL: 34 mg/dL (ref 0.0–40.0)

## 2015-01-07 LAB — HEMOGLOBIN A1C: HEMOGLOBIN A1C: 6.4 % (ref 4.6–6.5)

## 2015-01-10 ENCOUNTER — Ambulatory Visit (INDEPENDENT_AMBULATORY_CARE_PROVIDER_SITE_OTHER): Payer: Medicare Other | Admitting: Internal Medicine

## 2015-01-10 ENCOUNTER — Encounter: Payer: Self-pay | Admitting: Internal Medicine

## 2015-01-10 ENCOUNTER — Other Ambulatory Visit: Payer: Medicare Other

## 2015-01-10 VITALS — BP 110/70 | HR 59 | Temp 98.3°F | Resp 18 | Ht 65.0 in | Wt 213.0 lb

## 2015-01-10 DIAGNOSIS — Z91048 Other nonmedicinal substance allergy status: Secondary | ICD-10-CM | POA: Diagnosis not present

## 2015-01-10 DIAGNOSIS — Z23 Encounter for immunization: Secondary | ICD-10-CM

## 2015-01-10 DIAGNOSIS — I10 Essential (primary) hypertension: Secondary | ICD-10-CM

## 2015-01-10 DIAGNOSIS — E7800 Pure hypercholesterolemia, unspecified: Secondary | ICD-10-CM

## 2015-01-10 DIAGNOSIS — Z86010 Personal history of colonic polyps: Secondary | ICD-10-CM

## 2015-01-10 DIAGNOSIS — F439 Reaction to severe stress, unspecified: Secondary | ICD-10-CM

## 2015-01-10 DIAGNOSIS — E119 Type 2 diabetes mellitus without complications: Secondary | ICD-10-CM | POA: Diagnosis not present

## 2015-01-10 DIAGNOSIS — Z658 Other specified problems related to psychosocial circumstances: Secondary | ICD-10-CM

## 2015-01-10 DIAGNOSIS — I8393 Asymptomatic varicose veins of bilateral lower extremities: Secondary | ICD-10-CM | POA: Diagnosis not present

## 2015-01-10 DIAGNOSIS — Z9109 Other allergy status, other than to drugs and biological substances: Secondary | ICD-10-CM

## 2015-01-10 NOTE — Addendum Note (Signed)
Addended by: Karlene Einstein D on: 01/10/2015 02:56 PM   Modules accepted: Orders

## 2015-01-10 NOTE — Patient Instructions (Signed)

## 2015-01-10 NOTE — Progress Notes (Signed)
Pre-visit discussion using our clinic review tool. No additional management support is needed unless otherwise documented below in the visit note.  

## 2015-01-10 NOTE — Progress Notes (Signed)
Patient ID: Carrie Mills, female   DOB: 03-09-1941, 74 y.o.   MRN: [id number]   Subjective:    Patient ID: Carrie Mills, female    DOB: 1940-10-21, 74 y.o.   MRN: [id number]  HPI  Patient with past history of diabetes, hypertension and hypercholesterolemia.  She comes in today to follow up on these issues.  She reports she is doing well.  Sugars reviewed and attached.  AM sugars averaging 110-120 and pm sugars averaging 120-150s.  Discussed diet and exercise.  She exercises regularly.  Some increased stress with her son's medical issues.  She does not feel she needs any further intervention.  No chest pain or tightness.  No sob.  No acid reflux.  No abdominal pain or cramping.  Bowels stable.  She had questions about insurance coverage on some of her medications.    Past Medical History  Diagnosis Date  . Allergy   . Hyperlipidemia   . Colon polyps   . Phlebitis   . Hyperglycemia   . Hypertension   . Varicose veins    Past Surgical History  Procedure Laterality Date  . Appendectomy  1987  . Total abdominal hysterectomy w/ bilateral salpingoophorectomy  1987  . Abdominal hysterectomy  1987  . Thrombectomy    . Colectomy  2004  . Colonoscopy  2009  . Replacement total knee Left 06/05/13  . Colonoscopy  12-27-12    Dr Jamal Collin   Family History  Problem Relation Age of Onset  . Arthritis Mother   . Heart disease Mother     s/p CABG  . Hypertension Mother   . Heart disease Father   . Hypertension Father   . Diabetes Father   . Breast cancer Neg Hx   . Colon cancer Neg Hx    Social History   Social History  . Marital Status: Married    Spouse Name: N/A  . Number of Children: N/A  . Years of Education: N/A   Social History Main Topics  . Smoking status: Never Smoker   . Smokeless tobacco: Never Used  . Alcohol Use: No  . Drug Use: No  . Sexual Activity: Not Asked   Other Topics Concern  . None   Social History Narrative    Outpatient Encounter  Prescriptions as of 01/10/2015  Medication Sig  . ACCU-CHEK AVIVA PLUS test strip TEST BLOOD SUGAR ONCE A DAY OR AS INSTRUCTED. DX 250.0  . budesonide-formoterol (SYMBICORT) 160-4.5 MCG/ACT inhaler Inhale 2 puffs into the lungs daily.  . Calcium Carbonate Antacid 400 MG CHEW Chew by mouth daily.  . citalopram (CELEXA) 10 MG tablet TAKE 1 TABLET DAILY  . Coconut Oil 1000 MG CAPS Take by mouth.  . fluticasone (FLONASE) 50 MCG/ACT nasal spray   . Glucosamine HCl 1000 MG TABS Take by mouth.  . hydrochlorothiazide (HYDRODIURIL) 25 MG tablet TAKE 1 TABLET DAILY  . metoprolol succinate (TOPROL-XL) 25 MG 24 hr tablet TAKE 1 TABLET TWICE A DAY  . mometasone (ELOCON) 0.1 % lotion Use as directed  . Montelukast Sodium (SINGULAIR PO) Take by mouth daily.  . Multiple Vitamin (MULTIVITAMIN) capsule Take 1 capsule by mouth daily.  . Omega-3 Fatty Acids (FISH OIL) 1200 MG CAPS Take by mouth daily.  . pramoxine-hydrocortisone (ANALPRAM-HC) 1-1 % rectal cream Place 1 application rectally 2 (two) times daily.  . pravastatin (PRAVACHOL) 40 MG tablet TAKE 1 TABLET BY MOUTH DAILY.  . sodium chloride (OCEAN) 0.65 % nasal spray Place 1 spray into the  nose as needed.  . triamcinolone (NASACORT) 55 MCG/ACT nasal inhaler Place 2 sprays into the nose daily.   No facility-administered encounter medications on file as of 01/10/2015.    Review of Systems  Constitutional: Negative for appetite change and unexpected weight change.  HENT: Negative for congestion and sinus pressure.   Eyes: Negative for pain and visual disturbance.  Respiratory: Negative for cough, chest tightness and shortness of breath.   Cardiovascular: Negative for chest pain, palpitations and leg swelling.  Gastrointestinal: Negative for nausea, vomiting, abdominal pain and diarrhea.  Genitourinary: Negative for dysuria and difficulty urinating.  Musculoskeletal: Negative for back pain and joint swelling.  Skin: Negative for color change and rash.   Neurological: Negative for dizziness, light-headedness and headaches.  Psychiatric/Behavioral: Negative for dysphoric mood and agitation.       Objective:    Physical Exam  Constitutional: She appears well-developed and well-nourished. No distress.  HENT:  Nose: Nose normal.  Mouth/Throat: Oropharynx is clear and moist.  Eyes: Conjunctivae are normal. Right eye exhibits no discharge. Left eye exhibits no discharge.  Neck: Neck supple. No thyromegaly present.  Cardiovascular: Normal rate and regular rhythm.   Pulmonary/Chest: Breath sounds normal. No respiratory distress. She has no wheezes.  Abdominal: Soft. Bowel sounds are normal. There is no tenderness.  Musculoskeletal: She exhibits no edema or tenderness.  Lymphadenopathy:    She has no cervical adenopathy.  Skin: No rash noted. No erythema.  Psychiatric: She has a normal mood and affect. Her behavior is normal.    BP 110/70 mmHg  Pulse 59  Temp(Src) 98.3 F (36.8 C) (Oral)  Resp 18  Ht 5' 5"  (1.651 m)  Wt 213 lb (96.616 kg)  BMI 35.44 kg/m2  SpO2 96% Wt Readings from Last 3 Encounters:  01/10/15 213 lb (96.616 kg)  11/15/14 212 lb (96.163 kg)  09/12/14 211 lb 6 oz (95.879 kg)     Lab Results  Component Value Date   WBC 8.5 01/07/2015   HGB 13.2 01/07/2015   HCT 40.0 01/07/2015   PLT 250.0 01/07/2015   GLUCOSE 119* 01/07/2015   CHOL 157 01/07/2015   TRIG 170.0* 01/07/2015   HDL 38.30* 01/07/2015   LDLDIRECT 91.0 09/10/2014   LDLCALC 85 01/07/2015   ALT 29 01/07/2015   AST 26 01/07/2015   NA 137 01/07/2015   K 4.2 01/07/2015   CL 99 01/07/2015   CREATININE 0.98 01/07/2015   BUN 15 01/07/2015   CO2 31 01/07/2015   TSH 2.48 01/07/2015   INR 0.9 05/24/2013   HGBA1C 6.4 01/07/2015   MICROALBUR 0.4 09/25/2013       Assessment & Plan:   Problem List Items Addressed This Visit    Asymptomatic varicose veins    Followed by Dr Jamal Collin.  Continue compression hose.       Diabetes ([place])    Low carb  diet and exercise.  Follow met b and a1c.   Lab Results  Component Value Date   HGBA1C 6.4 01/07/2015        Relevant Orders   Hemoglobin A1c   Environmental allergies    Followed by Dr Donneta Romberg.       History of colonic polyps    Colonoscopy 12/27/12 - normal.  Recommended f/u colonoscopy in five years.       Hypercholesterolemia    Low cholesterol diet and exercise.  Follow lipid panel.       Relevant Orders   Hepatic function panel   Lipid panel  Hypertension    Blood pressure under good control.  Continue same medication regimen.  Follow pressures.  Follow metabolic panel.        Relevant Orders   Basic metabolic panel   Stress    Desires no further intervention.  Follow.        Other Visit Diagnoses    Need for prophylactic vaccination against Streptococcus pneumoniae (pneumococcus)    -  Primary    Relevant Orders    Pneumococcal polysaccharide vaccine 23-valent greater than or equal to 2yo subcutaneous/IM (Completed)        Einar Pheasant, MD

## 2015-01-11 ENCOUNTER — Telehealth: Payer: Self-pay | Admitting: *Deleted

## 2015-01-11 MED ORDER — PENCICLOVIR 1 % EX CREA: TOPICAL_CREAM | CUTANEOUS | Status: DC

## 2015-01-11 NOTE — Telephone Encounter (Signed)
rx sent in.  Tell her I am sorry for the inconvenience.  I forgot to send in yesterday.  Thanks

## 2015-01-11 NOTE — Telephone Encounter (Signed)
Please advise 

## 2015-01-11 NOTE — Telephone Encounter (Signed)
Patient requested a medication for her fever blister.

## 2015-01-14 ENCOUNTER — Ambulatory Visit: Payer: Medicare Other | Admitting: Internal Medicine

## 2015-01-15 ENCOUNTER — Encounter: Payer: Self-pay | Admitting: Internal Medicine

## 2015-01-15 NOTE — Assessment & Plan Note (Signed)
Blood pressure under good control.  Continue same medication regimen.  Follow pressures.  Follow metabolic panel.   

## 2015-01-15 NOTE — Assessment & Plan Note (Signed)
Followed by Dr Jamal Collin.  Continue compression hose.

## 2015-01-15 NOTE — Assessment & Plan Note (Signed)
Low carb diet and exercise.  Follow met b and a1c.   Lab Results  Component Value Date   HGBA1C 6.4 01/07/2015

## 2015-01-15 NOTE — Assessment & Plan Note (Signed)
Desires no further intervention.  Follow.  

## 2015-01-15 NOTE — Assessment & Plan Note (Signed)
Followed by Dr Donneta Romberg.

## 2015-01-15 NOTE — Assessment & Plan Note (Signed)
Low cholesterol diet and exercise.  Follow lipid panel.   

## 2015-01-15 NOTE — Assessment & Plan Note (Signed)
Colonoscopy 12/27/12 - normal.  Recommended f/u colonoscopy in five years.   

## 2015-01-21 ENCOUNTER — Other Ambulatory Visit: Payer: Self-pay | Admitting: Internal Medicine

## 2015-01-30 DIAGNOSIS — D3611 Benign neoplasm of peripheral nerves and autonomic nervous system of face, head, and neck: Secondary | ICD-10-CM | POA: Diagnosis not present

## 2015-01-30 DIAGNOSIS — Z85828 Personal history of other malignant neoplasm of skin: Secondary | ICD-10-CM | POA: Diagnosis not present

## 2015-01-30 DIAGNOSIS — L821 Other seborrheic keratosis: Secondary | ICD-10-CM | POA: Diagnosis not present

## 2015-02-11 DIAGNOSIS — M9903 Segmental and somatic dysfunction of lumbar region: Secondary | ICD-10-CM | POA: Diagnosis not present

## 2015-02-11 DIAGNOSIS — M791 Myalgia: Secondary | ICD-10-CM | POA: Diagnosis not present

## 2015-02-11 DIAGNOSIS — M5136 Other intervertebral disc degeneration, lumbar region: Secondary | ICD-10-CM | POA: Diagnosis not present

## 2015-02-11 DIAGNOSIS — M9905 Segmental and somatic dysfunction of pelvic region: Secondary | ICD-10-CM | POA: Diagnosis not present

## 2015-02-11 DIAGNOSIS — M5441 Lumbago with sciatica, right side: Secondary | ICD-10-CM | POA: Diagnosis not present

## 2015-02-11 DIAGNOSIS — M9904 Segmental and somatic dysfunction of sacral region: Secondary | ICD-10-CM | POA: Diagnosis not present

## 2015-02-14 DIAGNOSIS — M9905 Segmental and somatic dysfunction of pelvic region: Secondary | ICD-10-CM | POA: Diagnosis not present

## 2015-02-14 DIAGNOSIS — M791 Myalgia: Secondary | ICD-10-CM | POA: Diagnosis not present

## 2015-02-14 DIAGNOSIS — M9903 Segmental and somatic dysfunction of lumbar region: Secondary | ICD-10-CM | POA: Diagnosis not present

## 2015-02-14 DIAGNOSIS — M9904 Segmental and somatic dysfunction of sacral region: Secondary | ICD-10-CM | POA: Diagnosis not present

## 2015-02-14 DIAGNOSIS — M5136 Other intervertebral disc degeneration, lumbar region: Secondary | ICD-10-CM | POA: Diagnosis not present

## 2015-02-14 DIAGNOSIS — M5441 Lumbago with sciatica, right side: Secondary | ICD-10-CM | POA: Diagnosis not present

## 2015-02-15 ENCOUNTER — Other Ambulatory Visit: Payer: Self-pay | Admitting: Internal Medicine

## 2015-02-19 DIAGNOSIS — M5136 Other intervertebral disc degeneration, lumbar region: Secondary | ICD-10-CM | POA: Diagnosis not present

## 2015-02-19 DIAGNOSIS — M9903 Segmental and somatic dysfunction of lumbar region: Secondary | ICD-10-CM | POA: Diagnosis not present

## 2015-02-19 DIAGNOSIS — M9905 Segmental and somatic dysfunction of pelvic region: Secondary | ICD-10-CM | POA: Diagnosis not present

## 2015-02-19 DIAGNOSIS — M5441 Lumbago with sciatica, right side: Secondary | ICD-10-CM | POA: Diagnosis not present

## 2015-02-19 DIAGNOSIS — M791 Myalgia: Secondary | ICD-10-CM | POA: Diagnosis not present

## 2015-02-19 DIAGNOSIS — M9904 Segmental and somatic dysfunction of sacral region: Secondary | ICD-10-CM | POA: Diagnosis not present

## 2015-02-25 DIAGNOSIS — M791 Myalgia: Secondary | ICD-10-CM | POA: Diagnosis not present

## 2015-02-25 DIAGNOSIS — M9905 Segmental and somatic dysfunction of pelvic region: Secondary | ICD-10-CM | POA: Diagnosis not present

## 2015-02-25 DIAGNOSIS — M5136 Other intervertebral disc degeneration, lumbar region: Secondary | ICD-10-CM | POA: Diagnosis not present

## 2015-02-25 DIAGNOSIS — M9904 Segmental and somatic dysfunction of sacral region: Secondary | ICD-10-CM | POA: Diagnosis not present

## 2015-02-25 DIAGNOSIS — M5441 Lumbago with sciatica, right side: Secondary | ICD-10-CM | POA: Diagnosis not present

## 2015-02-25 DIAGNOSIS — M9903 Segmental and somatic dysfunction of lumbar region: Secondary | ICD-10-CM | POA: Diagnosis not present

## 2015-02-26 DIAGNOSIS — Z96652 Presence of left artificial knee joint: Secondary | ICD-10-CM | POA: Diagnosis not present

## 2015-02-28 DIAGNOSIS — M9905 Segmental and somatic dysfunction of pelvic region: Secondary | ICD-10-CM | POA: Diagnosis not present

## 2015-02-28 DIAGNOSIS — M9903 Segmental and somatic dysfunction of lumbar region: Secondary | ICD-10-CM | POA: Diagnosis not present

## 2015-02-28 DIAGNOSIS — M5136 Other intervertebral disc degeneration, lumbar region: Secondary | ICD-10-CM | POA: Diagnosis not present

## 2015-02-28 DIAGNOSIS — M5441 Lumbago with sciatica, right side: Secondary | ICD-10-CM | POA: Diagnosis not present

## 2015-02-28 DIAGNOSIS — M791 Myalgia: Secondary | ICD-10-CM | POA: Diagnosis not present

## 2015-02-28 DIAGNOSIS — M9904 Segmental and somatic dysfunction of sacral region: Secondary | ICD-10-CM | POA: Diagnosis not present

## 2015-03-03 DIAGNOSIS — Z96652 Presence of left artificial knee joint: Secondary | ICD-10-CM | POA: Insufficient documentation

## 2015-03-04 DIAGNOSIS — M9903 Segmental and somatic dysfunction of lumbar region: Secondary | ICD-10-CM | POA: Diagnosis not present

## 2015-03-04 DIAGNOSIS — M791 Myalgia: Secondary | ICD-10-CM | POA: Diagnosis not present

## 2015-03-04 DIAGNOSIS — M5136 Other intervertebral disc degeneration, lumbar region: Secondary | ICD-10-CM | POA: Diagnosis not present

## 2015-03-04 DIAGNOSIS — M9905 Segmental and somatic dysfunction of pelvic region: Secondary | ICD-10-CM | POA: Diagnosis not present

## 2015-03-04 DIAGNOSIS — M9904 Segmental and somatic dysfunction of sacral region: Secondary | ICD-10-CM | POA: Diagnosis not present

## 2015-03-04 DIAGNOSIS — M5441 Lumbago with sciatica, right side: Secondary | ICD-10-CM | POA: Diagnosis not present

## 2015-03-07 DIAGNOSIS — M5441 Lumbago with sciatica, right side: Secondary | ICD-10-CM | POA: Diagnosis not present

## 2015-03-07 DIAGNOSIS — M9903 Segmental and somatic dysfunction of lumbar region: Secondary | ICD-10-CM | POA: Diagnosis not present

## 2015-03-07 DIAGNOSIS — M5136 Other intervertebral disc degeneration, lumbar region: Secondary | ICD-10-CM | POA: Diagnosis not present

## 2015-03-07 DIAGNOSIS — M9904 Segmental and somatic dysfunction of sacral region: Secondary | ICD-10-CM | POA: Diagnosis not present

## 2015-03-07 DIAGNOSIS — M9905 Segmental and somatic dysfunction of pelvic region: Secondary | ICD-10-CM | POA: Diagnosis not present

## 2015-03-07 DIAGNOSIS — M791 Myalgia: Secondary | ICD-10-CM | POA: Diagnosis not present

## 2015-03-19 DIAGNOSIS — M5441 Lumbago with sciatica, right side: Secondary | ICD-10-CM | POA: Diagnosis not present

## 2015-03-19 DIAGNOSIS — M791 Myalgia: Secondary | ICD-10-CM | POA: Diagnosis not present

## 2015-03-19 DIAGNOSIS — M5136 Other intervertebral disc degeneration, lumbar region: Secondary | ICD-10-CM | POA: Diagnosis not present

## 2015-03-19 DIAGNOSIS — M9903 Segmental and somatic dysfunction of lumbar region: Secondary | ICD-10-CM | POA: Diagnosis not present

## 2015-03-19 DIAGNOSIS — M9904 Segmental and somatic dysfunction of sacral region: Secondary | ICD-10-CM | POA: Diagnosis not present

## 2015-03-19 DIAGNOSIS — M9905 Segmental and somatic dysfunction of pelvic region: Secondary | ICD-10-CM | POA: Diagnosis not present

## 2015-03-21 DIAGNOSIS — M5441 Lumbago with sciatica, right side: Secondary | ICD-10-CM | POA: Diagnosis not present

## 2015-03-21 DIAGNOSIS — M9905 Segmental and somatic dysfunction of pelvic region: Secondary | ICD-10-CM | POA: Diagnosis not present

## 2015-03-21 DIAGNOSIS — M9904 Segmental and somatic dysfunction of sacral region: Secondary | ICD-10-CM | POA: Diagnosis not present

## 2015-03-21 DIAGNOSIS — M791 Myalgia: Secondary | ICD-10-CM | POA: Diagnosis not present

## 2015-03-21 DIAGNOSIS — M9903 Segmental and somatic dysfunction of lumbar region: Secondary | ICD-10-CM | POA: Diagnosis not present

## 2015-03-21 DIAGNOSIS — M5136 Other intervertebral disc degeneration, lumbar region: Secondary | ICD-10-CM | POA: Diagnosis not present

## 2015-03-28 DIAGNOSIS — M5136 Other intervertebral disc degeneration, lumbar region: Secondary | ICD-10-CM | POA: Diagnosis not present

## 2015-03-28 DIAGNOSIS — M5441 Lumbago with sciatica, right side: Secondary | ICD-10-CM | POA: Diagnosis not present

## 2015-03-28 DIAGNOSIS — M9903 Segmental and somatic dysfunction of lumbar region: Secondary | ICD-10-CM | POA: Diagnosis not present

## 2015-03-28 DIAGNOSIS — M9905 Segmental and somatic dysfunction of pelvic region: Secondary | ICD-10-CM | POA: Diagnosis not present

## 2015-03-28 DIAGNOSIS — M791 Myalgia: Secondary | ICD-10-CM | POA: Diagnosis not present

## 2015-03-28 DIAGNOSIS — M9904 Segmental and somatic dysfunction of sacral region: Secondary | ICD-10-CM | POA: Diagnosis not present

## 2015-04-03 DIAGNOSIS — M791 Myalgia: Secondary | ICD-10-CM | POA: Diagnosis not present

## 2015-04-03 DIAGNOSIS — M5441 Lumbago with sciatica, right side: Secondary | ICD-10-CM | POA: Diagnosis not present

## 2015-04-03 DIAGNOSIS — M9903 Segmental and somatic dysfunction of lumbar region: Secondary | ICD-10-CM | POA: Diagnosis not present

## 2015-04-03 DIAGNOSIS — M5136 Other intervertebral disc degeneration, lumbar region: Secondary | ICD-10-CM | POA: Diagnosis not present

## 2015-04-03 DIAGNOSIS — M9904 Segmental and somatic dysfunction of sacral region: Secondary | ICD-10-CM | POA: Diagnosis not present

## 2015-04-03 DIAGNOSIS — M9905 Segmental and somatic dysfunction of pelvic region: Secondary | ICD-10-CM | POA: Diagnosis not present

## 2015-04-10 DIAGNOSIS — M5441 Lumbago with sciatica, right side: Secondary | ICD-10-CM | POA: Diagnosis not present

## 2015-04-10 DIAGNOSIS — M9905 Segmental and somatic dysfunction of pelvic region: Secondary | ICD-10-CM | POA: Diagnosis not present

## 2015-04-10 DIAGNOSIS — M9903 Segmental and somatic dysfunction of lumbar region: Secondary | ICD-10-CM | POA: Diagnosis not present

## 2015-04-10 DIAGNOSIS — M9904 Segmental and somatic dysfunction of sacral region: Secondary | ICD-10-CM | POA: Diagnosis not present

## 2015-04-10 DIAGNOSIS — M5136 Other intervertebral disc degeneration, lumbar region: Secondary | ICD-10-CM | POA: Diagnosis not present

## 2015-04-10 DIAGNOSIS — M791 Myalgia: Secondary | ICD-10-CM | POA: Diagnosis not present

## 2015-04-17 DIAGNOSIS — M791 Myalgia: Secondary | ICD-10-CM | POA: Diagnosis not present

## 2015-04-17 DIAGNOSIS — M9903 Segmental and somatic dysfunction of lumbar region: Secondary | ICD-10-CM | POA: Diagnosis not present

## 2015-04-17 DIAGNOSIS — M9905 Segmental and somatic dysfunction of pelvic region: Secondary | ICD-10-CM | POA: Diagnosis not present

## 2015-04-17 DIAGNOSIS — M9904 Segmental and somatic dysfunction of sacral region: Secondary | ICD-10-CM | POA: Diagnosis not present

## 2015-04-17 DIAGNOSIS — M5136 Other intervertebral disc degeneration, lumbar region: Secondary | ICD-10-CM | POA: Diagnosis not present

## 2015-04-17 DIAGNOSIS — M5441 Lumbago with sciatica, right side: Secondary | ICD-10-CM | POA: Diagnosis not present

## 2015-04-22 ENCOUNTER — Other Ambulatory Visit: Payer: Self-pay

## 2015-04-22 MED ORDER — HYDROCHLOROTHIAZIDE 25 MG PO TABS: 25.0000 mg | ORAL_TABLET | Freq: Every day | ORAL | Status: DC

## 2015-04-22 MED ORDER — CITALOPRAM HYDROBROMIDE 10 MG PO TABS: 10.0000 mg | ORAL_TABLET | Freq: Every day | ORAL | Status: DC

## 2015-04-22 MED ORDER — METOPROLOL SUCCINATE ER 25 MG PO TB24: 25.0000 mg | ORAL_TABLET | Freq: Two times a day (BID) | ORAL | Status: DC

## 2015-04-24 DIAGNOSIS — M5441 Lumbago with sciatica, right side: Secondary | ICD-10-CM | POA: Diagnosis not present

## 2015-04-24 DIAGNOSIS — M5136 Other intervertebral disc degeneration, lumbar region: Secondary | ICD-10-CM | POA: Diagnosis not present

## 2015-04-24 DIAGNOSIS — M9905 Segmental and somatic dysfunction of pelvic region: Secondary | ICD-10-CM | POA: Diagnosis not present

## 2015-04-24 DIAGNOSIS — M791 Myalgia: Secondary | ICD-10-CM | POA: Diagnosis not present

## 2015-04-24 DIAGNOSIS — M9904 Segmental and somatic dysfunction of sacral region: Secondary | ICD-10-CM | POA: Diagnosis not present

## 2015-04-24 DIAGNOSIS — M9903 Segmental and somatic dysfunction of lumbar region: Secondary | ICD-10-CM | POA: Diagnosis not present

## 2015-04-25 ENCOUNTER — Encounter: Payer: Self-pay | Admitting: Internal Medicine

## 2015-04-25 ENCOUNTER — Other Ambulatory Visit: Payer: Self-pay

## 2015-04-25 MED ORDER — CITALOPRAM HYDROBROMIDE 10 MG PO TABS: 10.0000 mg | ORAL_TABLET | Freq: Every day | ORAL | Status: DC

## 2015-04-25 MED ORDER — HYDROCHLOROTHIAZIDE 25 MG PO TABS: 25.0000 mg | ORAL_TABLET | Freq: Every day | ORAL | Status: DC

## 2015-04-25 MED ORDER — METOPROLOL SUCCINATE ER 25 MG PO TB24: 25.0000 mg | ORAL_TABLET | Freq: Two times a day (BID) | ORAL | Status: DC

## 2015-05-01 DIAGNOSIS — M9905 Segmental and somatic dysfunction of pelvic region: Secondary | ICD-10-CM | POA: Diagnosis not present

## 2015-05-01 DIAGNOSIS — M9903 Segmental and somatic dysfunction of lumbar region: Secondary | ICD-10-CM | POA: Diagnosis not present

## 2015-05-01 DIAGNOSIS — M9904 Segmental and somatic dysfunction of sacral region: Secondary | ICD-10-CM | POA: Diagnosis not present

## 2015-05-01 DIAGNOSIS — M791 Myalgia: Secondary | ICD-10-CM | POA: Diagnosis not present

## 2015-05-01 DIAGNOSIS — M5136 Other intervertebral disc degeneration, lumbar region: Secondary | ICD-10-CM | POA: Diagnosis not present

## 2015-05-01 DIAGNOSIS — M5441 Lumbago with sciatica, right side: Secondary | ICD-10-CM | POA: Diagnosis not present

## 2015-05-02 DIAGNOSIS — M9905 Segmental and somatic dysfunction of pelvic region: Secondary | ICD-10-CM | POA: Diagnosis not present

## 2015-05-02 DIAGNOSIS — M9904 Segmental and somatic dysfunction of sacral region: Secondary | ICD-10-CM | POA: Diagnosis not present

## 2015-05-02 DIAGNOSIS — M9903 Segmental and somatic dysfunction of lumbar region: Secondary | ICD-10-CM | POA: Diagnosis not present

## 2015-05-02 DIAGNOSIS — M791 Myalgia: Secondary | ICD-10-CM | POA: Diagnosis not present

## 2015-05-02 DIAGNOSIS — M5441 Lumbago with sciatica, right side: Secondary | ICD-10-CM | POA: Diagnosis not present

## 2015-05-02 DIAGNOSIS — M5136 Other intervertebral disc degeneration, lumbar region: Secondary | ICD-10-CM | POA: Diagnosis not present

## 2015-05-08 DIAGNOSIS — M5136 Other intervertebral disc degeneration, lumbar region: Secondary | ICD-10-CM | POA: Diagnosis not present

## 2015-05-08 DIAGNOSIS — M9903 Segmental and somatic dysfunction of lumbar region: Secondary | ICD-10-CM | POA: Diagnosis not present

## 2015-05-08 DIAGNOSIS — M791 Myalgia: Secondary | ICD-10-CM | POA: Diagnosis not present

## 2015-05-08 DIAGNOSIS — M9904 Segmental and somatic dysfunction of sacral region: Secondary | ICD-10-CM | POA: Diagnosis not present

## 2015-05-08 DIAGNOSIS — M9905 Segmental and somatic dysfunction of pelvic region: Secondary | ICD-10-CM | POA: Diagnosis not present

## 2015-05-08 DIAGNOSIS — M5441 Lumbago with sciatica, right side: Secondary | ICD-10-CM | POA: Diagnosis not present

## 2015-05-10 ENCOUNTER — Other Ambulatory Visit (INDEPENDENT_AMBULATORY_CARE_PROVIDER_SITE_OTHER): Payer: Medicare Other

## 2015-05-10 DIAGNOSIS — E7800 Pure hypercholesterolemia, unspecified: Secondary | ICD-10-CM

## 2015-05-10 DIAGNOSIS — E119 Type 2 diabetes mellitus without complications: Secondary | ICD-10-CM | POA: Diagnosis not present

## 2015-05-10 DIAGNOSIS — I10 Essential (primary) hypertension: Secondary | ICD-10-CM | POA: Diagnosis not present

## 2015-05-10 LAB — HEPATIC FUNCTION PANEL
ALT: 21 U/L (ref 0–35)
AST: 18 U/L (ref 0–37)
Albumin: 4 g/dL (ref 3.5–5.2)
Alkaline Phosphatase: 50 U/L (ref 39–117)
BILIRUBIN DIRECT: 0.2 mg/dL (ref 0.0–0.3)
BILIRUBIN TOTAL: 1 mg/dL (ref 0.2–1.2)
Total Protein: 6.7 g/dL (ref 6.0–8.3)

## 2015-05-10 LAB — LIPID PANEL
CHOLESTEROL: 169 mg/dL (ref 0–200)
HDL: 39.9 mg/dL (ref >39.00)
NonHDL: 128.8
TRIGLYCERIDES: 205 mg/dL — AB (ref 0.0–149.0)
Total CHOL/HDL Ratio: 4
VLDL: 41 mg/dL — ABNORMAL HIGH (ref 0.0–40.0)

## 2015-05-10 LAB — BASIC METABOLIC PANEL
BUN: 12 mg/dL (ref 6–23)
CHLORIDE: 98 meq/L (ref 96–112)
CO2: 31 mEq/L (ref 19–32)
Calcium: 9.5 mg/dL (ref 8.4–10.5)
Creatinine, Ser: 0.93 mg/dL (ref 0.40–1.20)
GFR: 62.25 mL/min (ref >60.00)
Glucose, Bld: 138 mg/dL — ABNORMAL HIGH (ref 70–99)
POTASSIUM: 3.9 meq/L (ref 3.5–5.1)
SODIUM: 136 meq/L (ref 135–145)

## 2015-05-10 LAB — LDL CHOLESTEROL, DIRECT: Direct LDL: 89 mg/dL

## 2015-05-10 LAB — HEMOGLOBIN A1C: HEMOGLOBIN A1C: 6.7 % — AB (ref 4.6–6.5)

## 2015-05-13 ENCOUNTER — Encounter: Payer: Medicare Other | Admitting: Internal Medicine

## 2015-05-14 ENCOUNTER — Encounter: Payer: Self-pay | Admitting: Internal Medicine

## 2015-05-14 ENCOUNTER — Ambulatory Visit (INDEPENDENT_AMBULATORY_CARE_PROVIDER_SITE_OTHER): Payer: Medicare Other | Admitting: Internal Medicine

## 2015-05-14 ENCOUNTER — Other Ambulatory Visit: Payer: Self-pay | Admitting: Internal Medicine

## 2015-05-14 VITALS — BP 126/72 | HR 66 | Temp 98.3°F | Resp 12 | Ht 64.75 in | Wt 205.2 lb

## 2015-05-14 DIAGNOSIS — I10 Essential (primary) hypertension: Secondary | ICD-10-CM | POA: Diagnosis not present

## 2015-05-14 DIAGNOSIS — Z1239 Encounter for other screening for malignant neoplasm of breast: Secondary | ICD-10-CM

## 2015-05-14 DIAGNOSIS — I8393 Asymptomatic varicose veins of bilateral lower extremities: Secondary | ICD-10-CM

## 2015-05-14 DIAGNOSIS — M5441 Lumbago with sciatica, right side: Secondary | ICD-10-CM

## 2015-05-14 DIAGNOSIS — Z658 Other specified problems related to psychosocial circumstances: Secondary | ICD-10-CM

## 2015-05-14 DIAGNOSIS — E7800 Pure hypercholesterolemia, unspecified: Secondary | ICD-10-CM

## 2015-05-14 DIAGNOSIS — E119 Type 2 diabetes mellitus without complications: Secondary | ICD-10-CM | POA: Diagnosis not present

## 2015-05-14 DIAGNOSIS — Z86010 Personal history of colonic polyps: Secondary | ICD-10-CM

## 2015-05-14 DIAGNOSIS — F439 Reaction to severe stress, unspecified: Secondary | ICD-10-CM

## 2015-05-14 DIAGNOSIS — Z0000 Encounter for general adult medical examination without abnormal findings: Secondary | ICD-10-CM

## 2015-05-14 DIAGNOSIS — M79601 Pain in right arm: Secondary | ICD-10-CM

## 2015-05-14 NOTE — Progress Notes (Signed)
Patient ID: Carrie Mills, female   DOB: [birth year], 75 y.o.   MRN: [id number]   Subjective:    Patient ID: Carrie Mills, female    DOB: 1940/04/20, 75 y.o.   MRN: [id number]  HPI  Patient with past history of hyperglycemia. Hypertension and hypercholesterolemia.  She comes in today to follow up on these issues as well as for a complete physical exam.  She recently lost her son.  Increased stress related to this.  She feels she is doing relatively well.  Does not feel needs any further intervention.  She has a history of herniated disc.  Has been seeing a chiropractor (Dr Birdie Sons).  States is better.  She does still report some low back pain and some pain down her right leg to her foot.  Has not been walking as much - secondary to this.  Dr Birdie Sons took xrays.  She is planning to find out results and f/u with him tomorrow.  She is having some pain in her right arm.  Also some right hand numbness.  Hurts to lift her arm.  No neck or shoulder pain.  Does report some discomfort in her right shoulder blade.  Arm aches.  No injury or trauma.  She was questioning if her cholesterol medication is contributing.     Past Medical History  Diagnosis Date  . Allergy   . Hyperlipidemia   . Colon polyps   . Phlebitis   . Hyperglycemia   . Hypertension   . Varicose veins    Past Surgical History  Procedure Laterality Date  . Appendectomy  1987  . Total abdominal hysterectomy w/ bilateral salpingoophorectomy  1987  . Abdominal hysterectomy  1987  . Thrombectomy    . Colectomy  2004  . Colonoscopy  2009  . Replacement total knee Left 06/05/13  . Colonoscopy  12-27-12    Dr Jamal Collin   Family History  Problem Relation Age of Onset  . Arthritis Mother   . Heart disease Mother     s/p CABG  . Hypertension Mother   . Heart disease Father   . Hypertension Father   . Diabetes Father   . Breast cancer Neg Hx   . Colon cancer Neg Hx    Social History   Social History  . Marital Status: Married     Spouse Name: N/A  . Number of Children: N/A  . Years of Education: N/A   Social History Main Topics  . Smoking status: Never Smoker   . Smokeless tobacco: Never Used  . Alcohol Use: No  . Drug Use: No  . Sexual Activity: Not Asked   Other Topics Concern  . None   Social History Narrative   Outpatient Encounter Prescriptions as of 05/14/2015  Medication Sig  . ACCU-CHEK AVIVA PLUS test strip TEST BLOOD SUGAR ONCE A DAY OR AS INSTRUCTED. DX 250.0  . budesonide-formoterol (SYMBICORT) 160-4.5 MCG/ACT inhaler Inhale 2 puffs into the lungs daily.  . citalopram (CELEXA) 10 MG tablet Take 1 tablet (10 mg total) by mouth daily.  . Coconut Oil 1000 MG CAPS Take by mouth.  . fluticasone (FLONASE) 50 MCG/ACT nasal spray   . Glucosamine HCl 1000 MG TABS Take by mouth.  . hydrochlorothiazide (HYDRODIURIL) 25 MG tablet Take 1 tablet (25 mg total) by mouth daily.  . metoprolol succinate (TOPROL-XL) 25 MG 24 hr tablet Take 1 tablet (25 mg total) by mouth 2 (two) times daily.  . mometasone (ELOCON) 0.1 % lotion Use as  directed  . Montelukast Sodium (SINGULAIR PO) Take by mouth daily.  . Multiple Vitamin (MULTIVITAMIN) capsule Take 1 capsule by mouth daily.  . Omega-3 Fatty Acids (FISH OIL) 1200 MG CAPS Take by mouth daily.  Marland Kitchen penciclovir (DENAVIR) 1 % cream Apply to affected area qid  . pramoxine-hydrocortisone (ANALPRAM-HC) 1-1 % rectal cream Place 1 application rectally 2 (two) times daily.  . pravastatin (PRAVACHOL) 40 MG tablet TAKE 1 TABLET BY MOUTH DAILY.  . sodium chloride (OCEAN) 0.65 % nasal spray Place 1 spray into the nose as needed.  . triamcinolone (NASACORT) 55 MCG/ACT nasal inhaler Place 2 sprays into the nose daily.  . Calcium Carbonate Antacid 400 MG CHEW Chew by mouth daily. Reported on 05/14/2015   No facility-administered encounter medications on file as of 05/14/2015.     Review of Systems  Constitutional: Negative for appetite change and unexpected weight change.    HENT: Negative for congestion and sinus pressure.   Eyes: Negative for pain and visual disturbance.  Respiratory: Negative for cough, chest tightness and shortness of breath.   Cardiovascular: Negative for chest pain, palpitations and leg swelling.  Gastrointestinal: Negative for nausea, vomiting, abdominal pain and diarrhea.  Genitourinary: Negative for dysuria and difficulty urinating.  Musculoskeletal: Negative for joint swelling.       Back, leg and arm pain as outlined.    Skin: Negative for color change and rash.  Neurological: Negative for dizziness, light-headedness and headaches.  Hematological: Negative for adenopathy. Does not bruise/bleed easily.  Psychiatric/Behavioral: Negative for dysphoric mood and agitation.       Objective:    Physical Exam  Constitutional: She is oriented to person, place, and time. She appears well-developed and well-nourished. No distress.  HENT:  Nose: Nose normal.  Mouth/Throat: Oropharynx is clear and moist.  Eyes: Right eye exhibits no discharge. Left eye exhibits no discharge. No scleral icterus.  Neck: Neck supple. No thyromegaly present.  Cardiovascular: Normal rate and regular rhythm.   Pulmonary/Chest: Breath sounds normal. No accessory muscle usage. No tachypnea. No respiratory distress. She has no decreased breath sounds. She has no wheezes. She has no rhonchi. Right breast exhibits no inverted nipple, no mass, no nipple discharge and no tenderness (no axillary adenopathy). Left breast exhibits no inverted nipple, no mass, no nipple discharge and no tenderness (no axilarry adenopathy).  Abdominal: Soft. Bowel sounds are normal. There is no tenderness.  Musculoskeletal: She exhibits no edema.  No significant increase in pain with rotation of her right forearm.  Increased pain with lifting her arm.  No shoulder or neck pain.  Negative straight leg raise.  Some pulling sensation with straight leg raise.    Lymphadenopathy:    She has no  cervical adenopathy.  Neurological: She is alert and oriented to person, place, and time.  Skin: Skin is warm. No rash noted.  Psychiatric: She has a normal mood and affect. Her behavior is normal.    BP 126/72 mmHg  Pulse 66  Temp(Src) 98.3 F (36.8 C) (Oral)  Resp 12  Ht 5' 4.75" (1.645 m)  Wt 205 lb 4 oz (93.101 kg)  BMI 34.41 kg/m2  SpO2 98% Wt Readings from Last 3 Encounters:  05/14/15 205 lb 4 oz (93.101 kg)  01/10/15 213 lb (96.616 kg)  11/15/14 212 lb (96.163 kg)     Lab Results  Component Value Date   WBC 8.5 01/07/2015   HGB 13.2 01/07/2015   HCT 40.0 01/07/2015   PLT 250.0 01/07/2015   GLUCOSE  138* 05/10/2015   CHOL 169 05/10/2015   TRIG 205.0* 05/10/2015   HDL 39.90 05/10/2015   LDLDIRECT 89.0 05/10/2015   LDLCALC 85 01/07/2015   ALT 21 05/10/2015   AST 18 05/10/2015   NA 136 05/10/2015   K 3.9 05/10/2015   CL 98 05/10/2015   CREATININE 0.93 05/10/2015   BUN 12 05/10/2015   CO2 31 05/10/2015   TSH 2.48 01/07/2015   INR 0.9 05/24/2013   HGBA1C 6.7* 05/10/2015   MICROALBUR 0.4 09/25/2013       Assessment & Plan:   Problem List Items Addressed This Visit    Asymptomatic varicose veins    Continue compression hose.       Back pain    Seeing a chiropractor.  Had xrays.  Due to f/u tomorrow.  Wants to hold on further w/up until discussion with chiropractor.        Diabetes ([place])    Sugars have been under good control.  a1c just checked and stable.  Continue a low carb diet and exercise.  Follow met b and a1c.   Lab Results  Component Value Date   HGBA1C 6.7* 05/10/2015        Relevant Orders   Hemoglobin A1c   Health care maintenance    Physical today 05/14/15.  Scheduled mammogram.  Colonoscopy 12/27/12.  Recommended f/u in five years.        History of colonic polyps    Colonoscopy 12/27/12 - normal.  Recommended f/u colonoscopy in five years.        Hypercholesterolemia    Low cholesterol diet and exercise.  Follow lipid panel.   Discussed cholesterol medication and side effects.  She was questioning if could contribute to her msk complaints.  Will stop temporarily and see if any improvement.  Follow.        Relevant Orders   Lipid panel   Hepatic function panel   Hypertension    Blood pressure under good control.  Continue same medication regimen.  Follow pressures.  Follow metabolic panel.        Relevant Orders   Basic metabolic panel   Right arm pain    Increased aching and pain.  Does not have pain in her neck or shoulder.  No pain with rotation of her forearm.  Taking some antiinflammatories.  Avoid increased amount.  Will have ortho evaluate given persistent aching and pain.        Relevant Orders   Ambulatory referral to Orthopedic Surgery   Stress    Increased stress with the recent death of her son.  She does not feel needs any further intervention at this time.  Follow.         Other Visit Diagnoses    Routine general medical examination at a health care facility    -  Primary    Breast cancer screening        Relevant Orders    MM DIGITAL SCREENING BILATERAL        Einar Pheasant, MD

## 2015-05-14 NOTE — Progress Notes (Signed)
Pre-visit discussion using our clinic review tool. No additional management support is needed unless otherwise documented below in the visit note.  

## 2015-05-15 DIAGNOSIS — M5136 Other intervertebral disc degeneration, lumbar region: Secondary | ICD-10-CM | POA: Diagnosis not present

## 2015-05-15 DIAGNOSIS — M9903 Segmental and somatic dysfunction of lumbar region: Secondary | ICD-10-CM | POA: Diagnosis not present

## 2015-05-15 DIAGNOSIS — M9905 Segmental and somatic dysfunction of pelvic region: Secondary | ICD-10-CM | POA: Diagnosis not present

## 2015-05-15 DIAGNOSIS — M9904 Segmental and somatic dysfunction of sacral region: Secondary | ICD-10-CM | POA: Diagnosis not present

## 2015-05-15 DIAGNOSIS — M5441 Lumbago with sciatica, right side: Secondary | ICD-10-CM | POA: Diagnosis not present

## 2015-05-15 DIAGNOSIS — M791 Myalgia: Secondary | ICD-10-CM | POA: Diagnosis not present

## 2015-05-16 ENCOUNTER — Encounter: Payer: Self-pay | Admitting: Internal Medicine

## 2015-05-16 DIAGNOSIS — M549 Dorsalgia, unspecified: Secondary | ICD-10-CM | POA: Insufficient documentation

## 2015-05-16 DIAGNOSIS — M79601 Pain in right arm: Secondary | ICD-10-CM | POA: Insufficient documentation

## 2015-05-16 NOTE — Assessment & Plan Note (Signed)
Low cholesterol diet and exercise.  Follow lipid panel.  Discussed cholesterol medication and side effects.  She was questioning if could contribute to her msk complaints.  Will stop temporarily and see if any improvement.  Follow.

## 2015-05-16 NOTE — Assessment & Plan Note (Signed)
Sugars have been under good control.  a1c just checked and stable.  Continue a low carb diet and exercise.  Follow met b and a1c.   Lab Results  Component Value Date   HGBA1C 6.7* 05/10/2015

## 2015-05-16 NOTE — Assessment & Plan Note (Signed)
Physical today 05/14/15.  Scheduled mammogram.  Colonoscopy 12/27/12.  Recommended f/u in five years.

## 2015-05-16 NOTE — Assessment & Plan Note (Signed)
Blood pressure under good control.  Continue same medication regimen.  Follow pressures.  Follow metabolic panel.   

## 2015-05-16 NOTE — Assessment & Plan Note (Signed)
Increased aching and pain.  Does not have pain in her neck or shoulder.  No pain with rotation of her forearm.  Taking some antiinflammatories.  Avoid increased amount.  Will have ortho evaluate given persistent aching and pain.

## 2015-05-16 NOTE — Assessment & Plan Note (Signed)
Seeing a Restaurant manager, [employer].  Had xrays.  Due to f/u tomorrow.  Wants to hold on further w/up until discussion with chiropractor.

## 2015-05-16 NOTE — Assessment & Plan Note (Signed)
Colonoscopy 12/27/12 - normal.  Recommended f/u colonoscopy in five years.   

## 2015-05-16 NOTE — Assessment & Plan Note (Signed)
Continue compression hose.   

## 2015-05-16 NOTE — Assessment & Plan Note (Signed)
Increased stress with the recent death of her son.  She does not feel needs any further intervention at this time.  Follow.

## 2015-05-21 ENCOUNTER — Ambulatory Visit
Admission: RE | Admit: 2015-05-21 | Discharge: 2015-05-21 | Disposition: A | Discharge status: Home / Self Care | Payer: Medicare Other | Source: Ambulatory Visit | Attending: Internal Medicine | Admitting: Internal Medicine

## 2015-05-21 DIAGNOSIS — Z1239 Encounter for other screening for malignant neoplasm of breast: Secondary | ICD-10-CM

## 2015-06-19 DIAGNOSIS — M5136 Other intervertebral disc degeneration, lumbar region: Secondary | ICD-10-CM | POA: Diagnosis not present

## 2015-06-19 DIAGNOSIS — M9905 Segmental and somatic dysfunction of pelvic region: Secondary | ICD-10-CM | POA: Diagnosis not present

## 2015-06-19 DIAGNOSIS — M5441 Lumbago with sciatica, right side: Secondary | ICD-10-CM | POA: Diagnosis not present

## 2015-06-19 DIAGNOSIS — M9904 Segmental and somatic dysfunction of sacral region: Secondary | ICD-10-CM | POA: Diagnosis not present

## 2015-06-19 DIAGNOSIS — M9903 Segmental and somatic dysfunction of lumbar region: Secondary | ICD-10-CM | POA: Diagnosis not present

## 2015-06-19 DIAGNOSIS — M791 Myalgia: Secondary | ICD-10-CM | POA: Diagnosis not present

## 2015-07-17 DIAGNOSIS — M9903 Segmental and somatic dysfunction of lumbar region: Secondary | ICD-10-CM | POA: Diagnosis not present

## 2015-07-17 DIAGNOSIS — M791 Myalgia: Secondary | ICD-10-CM | POA: Diagnosis not present

## 2015-07-17 DIAGNOSIS — M5441 Lumbago with sciatica, right side: Secondary | ICD-10-CM | POA: Diagnosis not present

## 2015-07-17 DIAGNOSIS — M9905 Segmental and somatic dysfunction of pelvic region: Secondary | ICD-10-CM | POA: Diagnosis not present

## 2015-07-17 DIAGNOSIS — M5136 Other intervertebral disc degeneration, lumbar region: Secondary | ICD-10-CM | POA: Diagnosis not present

## 2015-07-17 DIAGNOSIS — M9904 Segmental and somatic dysfunction of sacral region: Secondary | ICD-10-CM | POA: Diagnosis not present

## 2015-07-18 DIAGNOSIS — J300 Vasomotor rhinitis: Secondary | ICD-10-CM | POA: Diagnosis not present

## 2015-07-18 DIAGNOSIS — J4530 Mild persistent asthma, uncomplicated: Secondary | ICD-10-CM | POA: Diagnosis not present

## 2015-07-22 ENCOUNTER — Other Ambulatory Visit: Payer: Self-pay | Admitting: Internal Medicine

## 2015-07-25 ENCOUNTER — Other Ambulatory Visit: Payer: Self-pay | Admitting: Internal Medicine

## 2015-08-16 DIAGNOSIS — M5441 Lumbago with sciatica, right side: Secondary | ICD-10-CM | POA: Diagnosis not present

## 2015-08-21 DIAGNOSIS — M5441 Lumbago with sciatica, right side: Secondary | ICD-10-CM | POA: Diagnosis not present

## 2015-08-21 DIAGNOSIS — M9905 Segmental and somatic dysfunction of pelvic region: Secondary | ICD-10-CM | POA: Diagnosis not present

## 2015-08-21 DIAGNOSIS — M5136 Other intervertebral disc degeneration, lumbar region: Secondary | ICD-10-CM | POA: Diagnosis not present

## 2015-08-21 DIAGNOSIS — M791 Myalgia: Secondary | ICD-10-CM | POA: Diagnosis not present

## 2015-08-21 DIAGNOSIS — M9903 Segmental and somatic dysfunction of lumbar region: Secondary | ICD-10-CM | POA: Diagnosis not present

## 2015-08-21 DIAGNOSIS — M9904 Segmental and somatic dysfunction of sacral region: Secondary | ICD-10-CM | POA: Diagnosis not present

## 2015-09-11 ENCOUNTER — Other Ambulatory Visit (INDEPENDENT_AMBULATORY_CARE_PROVIDER_SITE_OTHER): Payer: Medicare Other

## 2015-09-11 DIAGNOSIS — I10 Essential (primary) hypertension: Secondary | ICD-10-CM

## 2015-09-11 DIAGNOSIS — E119 Type 2 diabetes mellitus without complications: Secondary | ICD-10-CM | POA: Diagnosis not present

## 2015-09-11 DIAGNOSIS — E7800 Pure hypercholesterolemia, unspecified: Secondary | ICD-10-CM

## 2015-09-11 LAB — BASIC METABOLIC PANEL
BUN: 14 mg/dL (ref 6–23)
CO2: 31 meq/L (ref 19–32)
Calcium: 9.7 mg/dL (ref 8.4–10.5)
Chloride: 98 mEq/L (ref 96–112)
Creatinine, Ser: 0.95 mg/dL (ref 0.40–1.20)
GFR: 60.69 mL/min (ref >60.00)
GLUCOSE: 104 mg/dL — AB (ref 70–99)
POTASSIUM: 3.7 meq/L (ref 3.5–5.1)
SODIUM: 135 meq/L (ref 135–145)

## 2015-09-11 LAB — LIPID PANEL
CHOL/HDL RATIO: 5
Cholesterol: 219 mg/dL — ABNORMAL HIGH (ref 0–200)
HDL: 40.8 mg/dL (ref >39.00)
LDL CALC: 144 mg/dL — AB (ref 0–99)
NONHDL: 177.94
Triglycerides: 169 mg/dL — ABNORMAL HIGH (ref 0.0–149.0)
VLDL: 33.8 mg/dL (ref 0.0–40.0)

## 2015-09-11 LAB — HEPATIC FUNCTION PANEL
ALT: 22 U/L (ref 0–35)
AST: 21 U/L (ref 0–37)
Albumin: 4 g/dL (ref 3.5–5.2)
Alkaline Phosphatase: 48 U/L (ref 39–117)
BILIRUBIN DIRECT: 0.1 mg/dL (ref 0.0–0.3)
BILIRUBIN TOTAL: 0.8 mg/dL (ref 0.2–1.2)
Total Protein: 6.9 g/dL (ref 6.0–8.3)

## 2015-09-11 LAB — HEMOGLOBIN A1C: HEMOGLOBIN A1C: 6.3 % (ref 4.6–6.5)

## 2015-09-13 ENCOUNTER — Encounter: Payer: Self-pay | Admitting: Internal Medicine

## 2015-09-13 ENCOUNTER — Ambulatory Visit (INDEPENDENT_AMBULATORY_CARE_PROVIDER_SITE_OTHER): Payer: Medicare Other | Admitting: Internal Medicine

## 2015-09-13 VITALS — BP 120/70 | HR 62 | Temp 98.1°F | Resp 18 | Ht 64.75 in | Wt 204.0 lb

## 2015-09-13 DIAGNOSIS — E7800 Pure hypercholesterolemia, unspecified: Secondary | ICD-10-CM | POA: Diagnosis not present

## 2015-09-13 DIAGNOSIS — E119 Type 2 diabetes mellitus without complications: Secondary | ICD-10-CM | POA: Diagnosis not present

## 2015-09-13 DIAGNOSIS — I10 Essential (primary) hypertension: Secondary | ICD-10-CM | POA: Diagnosis not present

## 2015-09-13 DIAGNOSIS — M25562 Pain in left knee: Secondary | ICD-10-CM | POA: Diagnosis not present

## 2015-09-13 DIAGNOSIS — F439 Reaction to severe stress, unspecified: Secondary | ICD-10-CM

## 2015-09-13 DIAGNOSIS — Z658 Other specified problems related to psychosocial circumstances: Secondary | ICD-10-CM

## 2015-09-13 MED ORDER — ROSUVASTATIN CALCIUM 5 MG PO TABS: 5.0000 mg | ORAL_TABLET | Freq: Every day | ORAL | Status: DC

## 2015-09-13 MED ORDER — HYDROCORTISONE ACE-PRAMOXINE 1-1 % RE CREA: 1.0000 "application " | TOPICAL_CREAM | Freq: Two times a day (BID) | RECTAL | Status: DC

## 2015-09-13 NOTE — Progress Notes (Signed)
Pre-visit discussion using our clinic review tool. No additional management support is needed unless otherwise documented below in the visit note.  

## 2015-09-13 NOTE — Progress Notes (Signed)
Patient ID: Carrie Mills, female   DOB: January 21, 1941, 75 y.o.   MRN: [id number]   Subjective:    Patient ID: Carrie Mills, female    DOB: 1940-10-03, 75 y.o.   MRN: [id number]  HPI  Patient here for a scheduled follow up.  She states she is doing relatively well.  Increased stress.  Her husband was in an accident.  Lost his right leg.  In rehab.  She feels she is handling things relatively well.  Still exercising.  Eating differently.  Off pravastatin.  Cholesterol increased.  Discussed diet and exercise.  Discussed treatment.  No chest pain.  No sob.  No abdominal pain or cramping.  Bowels stable.  She is still having some right leg issues.  Saw ortho.  Doing stretches.  Better.     Past Medical History  Diagnosis Date  . Allergy   . Hyperlipidemia   . Colon polyps   . Phlebitis   . Hyperglycemia   . Hypertension   . Varicose veins    Past Surgical History  Procedure Laterality Date  . Appendectomy  1987  . Total abdominal hysterectomy w/ bilateral salpingoophorectomy  1987  . Abdominal hysterectomy  1987  . Thrombectomy    . Colectomy  2004  . Colonoscopy  2009  . Replacement total knee Left 06/05/13  . Colonoscopy  12-27-12    Dr Jamal Collin   Family History  Problem Relation Age of Onset  . Arthritis Mother   . Heart disease Mother     s/p CABG  . Hypertension Mother   . Heart disease Father   . Hypertension Father   . Diabetes Father   . Breast cancer Neg Hx   . Colon cancer Neg Hx    Social History   Social History  . Marital Status: Married    Spouse Name: N/A  . Number of Children: N/A  . Years of Education: N/A   Social History Main Topics  . Smoking status: Never Smoker   . Smokeless tobacco: Never Used  . Alcohol Use: No  . Drug Use: No  . Sexual Activity: Not Asked   Other Topics Concern  . None   Social History Narrative    Outpatient Encounter Prescriptions as of 09/13/2015  Medication Sig  . ACCU-CHEK AVIVA PLUS test strip TEST BLOOD  SUGAR ONCE A DAY OR AS INSTRUCTED. DX 250.0  . budesonide-formoterol (SYMBICORT) 160-4.5 MCG/ACT inhaler Inhale 2 puffs into the lungs daily.  . Calcium Carbonate Antacid 400 MG CHEW Chew by mouth daily. Reported on 05/14/2015  . citalopram (CELEXA) 10 MG tablet TAKE 1 TABLET DAILY  . Coconut Oil 1000 MG CAPS Take by mouth.  . fluticasone (FLONASE) 50 MCG/ACT nasal spray   . Glucosamine HCl 1000 MG TABS Take by mouth.  . hydrochlorothiazide (HYDRODIURIL) 25 MG tablet TAKE 1 TABLET DAILY  . metoprolol succinate (TOPROL-XL) 25 MG 24 hr tablet TAKE 1 TABLET TWICE A DAY  . mometasone (ELOCON) 0.1 % lotion Use as directed  . Montelukast Sodium (SINGULAIR PO) Take by mouth daily.  . Multiple Vitamin (MULTIVITAMIN) capsule Take 1 capsule by mouth daily.  . Omega-3 Fatty Acids (FISH OIL) 1200 MG CAPS Take by mouth daily.  Marland Kitchen penciclovir (DENAVIR) 1 % cream Apply to affected area qid  . pramoxine-hydrocortisone (ANALPRAM-HC) 1-1 % rectal cream Place 1 application rectally 2 (two) times daily.  . sodium chloride (OCEAN) 0.65 % nasal spray Place 1 spray into the nose as needed.  . triamcinolone (  NASACORT) 55 MCG/ACT nasal inhaler Place 2 sprays into the nose daily.  . [DISCONTINUED] pramoxine-hydrocortisone (ANALPRAM-HC) 1-1 % rectal cream Place 1 application rectally 2 (two) times daily.  . [DISCONTINUED] pravastatin (PRAVACHOL) 40 MG tablet TAKE 1 TABLET BY MOUTH DAILY.  . rosuvastatin (CRESTOR) 5 MG tablet Take 1 tablet (5 mg total) by mouth daily.   No facility-administered encounter medications on file as of 09/13/2015.    Review of Systems  Constitutional: Negative for appetite change and unexpected weight change.  HENT: Negative for congestion and sinus pressure.   Respiratory: Negative for cough, chest tightness and shortness of breath.   Cardiovascular: Negative for chest pain, palpitations and leg swelling.  Gastrointestinal: Negative for nausea, vomiting, abdominal pain and diarrhea.    Genitourinary: Negative for dysuria and difficulty urinating.  Musculoskeletal: Negative for back pain and joint swelling.  Skin: Negative for color change and rash.  Neurological: Negative for dizziness, light-headedness and headaches.  Psychiatric/Behavioral: Negative for dysphoric mood and agitation.       Objective:     Blood pressure rechecked by me:  128/72  Physical Exam  Constitutional: She appears well-developed and well-nourished. No distress.  HENT:  Nose: Nose normal.  Mouth/Throat: Oropharynx is clear and moist.  Neck: Neck supple. No thyromegaly present.  Cardiovascular: Normal rate and regular rhythm.   Pulmonary/Chest: Breath sounds normal. No respiratory distress. She has no wheezes.  Abdominal: Soft. Bowel sounds are normal. There is no tenderness.  Musculoskeletal: She exhibits no edema or tenderness.  Lymphadenopathy:    She has no cervical adenopathy.  Skin: No rash noted. No erythema.  Psychiatric: She has a normal mood and affect. Her behavior is normal.    BP 120/70 mmHg  Pulse 62  Temp(Src) 98.1 F (36.7 C) (Oral)  Resp 18  Ht 5' 4.75" (1.645 m)  Wt 204 lb (92.534 kg)  BMI 34.20 kg/m2  SpO2 94% Wt Readings from Last 3 Encounters:  09/13/15 204 lb (92.534 kg)  05/14/15 205 lb 4 oz (93.101 kg)  01/10/15 213 lb (96.616 kg)     Lab Results  Component Value Date   WBC 8.5 01/07/2015   HGB 13.2 01/07/2015   HCT 40.0 01/07/2015   PLT 250.0 01/07/2015   GLUCOSE 104* 09/11/2015   CHOL 219* 09/11/2015   TRIG 169.0* 09/11/2015   HDL 40.80 09/11/2015   LDLDIRECT 89.0 05/10/2015   LDLCALC 144* 09/11/2015   ALT 22 09/11/2015   AST 21 09/11/2015   NA 135 09/11/2015   K 3.7 09/11/2015   CL 98 09/11/2015   CREATININE 0.95 09/11/2015   BUN 14 09/11/2015   CO2 31 09/11/2015   TSH 2.48 01/07/2015   INR 0.9 05/24/2013   HGBA1C 6.3 09/11/2015   MICROALBUR 0.4 09/25/2013       Assessment & Plan:   Problem List Items Addressed This Visit     Diabetes ([place])    Low carb diet and exercise.  Follow met b and a1c.   Lab Results  Component Value Date   HGBA1C 6.3 09/11/2015        Relevant Medications   rosuvastatin (CRESTOR) 5 MG tablet   Hypercholesterolemia - Primary    Low cholesterol diet and exercise.  LDl increased.  Discussed starting crestor.  Follow lipid panel and liver function tests.        Relevant Medications   rosuvastatin (CRESTOR) 5 MG tablet   Other Relevant Orders   Hepatic function panel   Hypertension    Blood  pressure under good control.  Continue same medication regimen.  Follow pressures.  Follow metabolic panel.        Relevant Medications   rosuvastatin (CRESTOR) 5 MG tablet   Knee pain, left    S/p knee surgery.  Sees Dr Marry Guan.  Stretching.        Stress    Increased stress as outlined.  Overall she feels she is doing relatively well.  Follow.            Einar Pheasant, MD

## 2015-09-14 ENCOUNTER — Encounter: Payer: Self-pay | Admitting: Internal Medicine

## 2015-09-14 NOTE — Assessment & Plan Note (Signed)
Low carb diet and exercise.  Follow met b and a1c.   Lab Results  Component Value Date   HGBA1C 6.3 09/11/2015

## 2015-09-14 NOTE — Assessment & Plan Note (Signed)
S/p knee surgery.  Sees Dr Marry Guan.  Stretching.

## 2015-09-14 NOTE — Assessment & Plan Note (Signed)
Low cholesterol diet and exercise.  LDl increased.  Discussed starting crestor.  Follow lipid panel and liver function tests.

## 2015-09-14 NOTE — Assessment & Plan Note (Signed)
Increased stress as outlined.  Overall she feels she is doing relatively well.  Follow.

## 2015-09-14 NOTE — Assessment & Plan Note (Signed)
Blood pressure under good control.  Continue same medication regimen.  Follow pressures.  Follow metabolic panel.   

## 2015-09-18 DIAGNOSIS — M9904 Segmental and somatic dysfunction of sacral region: Secondary | ICD-10-CM | POA: Diagnosis not present

## 2015-09-18 DIAGNOSIS — M5441 Lumbago with sciatica, right side: Secondary | ICD-10-CM | POA: Diagnosis not present

## 2015-09-18 DIAGNOSIS — M9903 Segmental and somatic dysfunction of lumbar region: Secondary | ICD-10-CM | POA: Diagnosis not present

## 2015-09-18 DIAGNOSIS — M791 Myalgia: Secondary | ICD-10-CM | POA: Diagnosis not present

## 2015-09-18 DIAGNOSIS — M5136 Other intervertebral disc degeneration, lumbar region: Secondary | ICD-10-CM | POA: Diagnosis not present

## 2015-09-18 DIAGNOSIS — M9905 Segmental and somatic dysfunction of pelvic region: Secondary | ICD-10-CM | POA: Diagnosis not present

## 2015-09-26 DIAGNOSIS — H2513 Age-related nuclear cataract, bilateral: Secondary | ICD-10-CM | POA: Diagnosis not present

## 2015-10-07 ENCOUNTER — Ambulatory Visit
Admission: RE | Admit: 2015-10-07 | Discharge: 2015-10-07 | Disposition: A | Discharge status: Home / Self Care | Payer: Medicare Other | Source: Ambulatory Visit | Attending: Internal Medicine | Admitting: Internal Medicine

## 2015-10-07 ENCOUNTER — Other Ambulatory Visit: Payer: Self-pay | Admitting: Internal Medicine

## 2015-10-07 DIAGNOSIS — Z1231 Encounter for screening mammogram for malignant neoplasm of breast: Secondary | ICD-10-CM | POA: Insufficient documentation

## 2015-10-07 DIAGNOSIS — Z1239 Encounter for other screening for malignant neoplasm of breast: Secondary | ICD-10-CM

## 2015-10-07 LAB — HM MAMMOGRAPHY

## 2015-10-08 ENCOUNTER — Other Ambulatory Visit: Payer: Self-pay | Admitting: Internal Medicine

## 2015-10-08 NOTE — Telephone Encounter (Signed)
LOV 09/13/2015. Renaldo Fiddler, CMA

## 2015-10-09 NOTE — Telephone Encounter (Signed)
rx ok'd for metoprolol #180 with one refill.

## 2015-10-10 ENCOUNTER — Encounter: Payer: Self-pay | Admitting: *Deleted

## 2015-10-16 DIAGNOSIS — M9903 Segmental and somatic dysfunction of lumbar region: Secondary | ICD-10-CM | POA: Diagnosis not present

## 2015-10-16 DIAGNOSIS — M791 Myalgia: Secondary | ICD-10-CM | POA: Diagnosis not present

## 2015-10-16 DIAGNOSIS — M5136 Other intervertebral disc degeneration, lumbar region: Secondary | ICD-10-CM | POA: Diagnosis not present

## 2015-10-16 DIAGNOSIS — M9905 Segmental and somatic dysfunction of pelvic region: Secondary | ICD-10-CM | POA: Diagnosis not present

## 2015-10-16 DIAGNOSIS — M9904 Segmental and somatic dysfunction of sacral region: Secondary | ICD-10-CM | POA: Diagnosis not present

## 2015-10-16 DIAGNOSIS — M5441 Lumbago with sciatica, right side: Secondary | ICD-10-CM | POA: Diagnosis not present

## 2015-10-25 ENCOUNTER — Other Ambulatory Visit: Payer: Medicare Other

## 2015-11-06 DIAGNOSIS — M5416 Radiculopathy, lumbar region: Secondary | ICD-10-CM | POA: Diagnosis not present

## 2015-11-08 DIAGNOSIS — M5416 Radiculopathy, lumbar region: Secondary | ICD-10-CM | POA: Diagnosis not present

## 2015-11-11 DIAGNOSIS — M5416 Radiculopathy, lumbar region: Secondary | ICD-10-CM | POA: Diagnosis not present

## 2015-11-14 DIAGNOSIS — M5416 Radiculopathy, lumbar region: Secondary | ICD-10-CM | POA: Diagnosis not present

## 2015-11-19 DIAGNOSIS — M5416 Radiculopathy, lumbar region: Secondary | ICD-10-CM | POA: Diagnosis not present

## 2015-11-21 DIAGNOSIS — M5416 Radiculopathy, lumbar region: Secondary | ICD-10-CM | POA: Diagnosis not present

## 2015-11-25 ENCOUNTER — Encounter: Payer: Self-pay | Admitting: *Deleted

## 2015-11-26 DIAGNOSIS — M5416 Radiculopathy, lumbar region: Secondary | ICD-10-CM | POA: Diagnosis not present

## 2015-11-28 ENCOUNTER — Ambulatory Visit (INDEPENDENT_AMBULATORY_CARE_PROVIDER_SITE_OTHER): Payer: Medicare Other | Admitting: General Surgery

## 2015-11-28 ENCOUNTER — Encounter: Payer: Self-pay | Admitting: General Surgery

## 2015-11-28 VITALS — BP 124/70 | HR 78 | Resp 14 | Ht 60.0 in | Wt 210.0 lb

## 2015-11-28 DIAGNOSIS — I872 Venous insufficiency (chronic) (peripheral): Secondary | ICD-10-CM | POA: Diagnosis not present

## 2015-11-28 NOTE — Patient Instructions (Signed)
The patient is aware to call back for any questions or concerns.  

## 2015-11-28 NOTE — Progress Notes (Signed)
Patient ID: Carrie Mills, female   DOB: 06-20-1940, 75 y.o.   MRN: [id number]  Chief Complaint  Patient presents with  . Follow-up    varicose veins    HPI Carrie Mills is a 75 y.o. female who presents for a follow up of varicose veins. She denies any problems with her legs at this time. She is using her compression hose on a daily basis. She just finished physical therapy for her back which has helped. She cares for her husband who was in a MVA and lost his leg. He son passed in December 2016.  I have reviewed the history of present illness with the patient.   HPI  Past Medical History:  Diagnosis Date  . Allergy   . Colon polyps   . Hyperglycemia   . Hyperlipidemia   . Hypertension   . Phlebitis   . Varicose veins     Past Surgical History:  Procedure Laterality Date  . ABDOMINAL HYSTERECTOMY  1987  . APPENDECTOMY  1987  . COLECTOMY  2004  . COLONOSCOPY  2009  . COLONOSCOPY  12-27-12   Dr Jamal Collin  . REPLACEMENT TOTAL KNEE Left 06/05/13  . THROMBECTOMY    . TOTAL ABDOMINAL HYSTERECTOMY W/ BILATERAL SALPINGOOPHORECTOMY  1987    Family History  Problem Relation Age of Onset  . Arthritis Mother   . Heart disease Mother     s/p CABG  . Hypertension Mother   . Heart disease Father   . Hypertension Father   . Diabetes Father   . Breast cancer Neg Hx   . Colon cancer Neg Hx     Social History Social History  Substance Use Topics  . Smoking status: Never Smoker  . Smokeless tobacco: Never Used  . Alcohol use No    Allergies  Allergen Reactions  . Sulfa Antibiotics Hives and Other (See Comments)    Fainting    Current Outpatient Prescriptions  Medication Sig Dispense Refill  . ACCU-CHEK AVIVA PLUS test strip TEST BLOOD SUGAR ONCE A DAY OR AS INSTRUCTED. DX 250.0 100 each 6  . Calcium Carbonate Antacid 400 MG CHEW Chew by mouth daily. Reported on 05/14/2015    . citalopram (CELEXA) 10 MG tablet TAKE 1 TABLET DAILY 90 tablet 1  . Coconut Oil 1000 MG  CAPS Take by mouth.    . fluticasone (FLONASE) 50 MCG/ACT nasal spray     . Glucosamine HCl 1000 MG TABS Take by mouth.    . hydrochlorothiazide (HYDRODIURIL) 25 MG tablet TAKE 1 TABLET DAILY 90 tablet 1  . metoprolol succinate (TOPROL-XL) 25 MG 24 hr tablet TAKE 1 TABLET TWICE A DAY 180 tablet 1  . mometasone (ELOCON) 0.1 % lotion Use as directed 60 mL 0  . Montelukast Sodium (SINGULAIR PO) Take by mouth daily.    . Multiple Vitamin (MULTIVITAMIN) capsule Take 1 capsule by mouth daily.    . Omega-3 Fatty Acids (FISH OIL) 1200 MG CAPS Take by mouth daily.    Marland Kitchen penciclovir (DENAVIR) 1 % cream Apply to affected area qid 1.5 g 0  . pramoxine-hydrocortisone (ANALPRAM-HC) 1-1 % rectal cream Place 1 application rectally 2 (two) times daily. 30 g 0  . rosuvastatin (CRESTOR) 5 MG tablet Take 1 tablet (5 mg total) by mouth daily. 30 tablet 1  . sodium chloride (OCEAN) 0.65 % nasal spray Place 1 spray into the nose as needed.    . triamcinolone (NASACORT) 55 MCG/ACT nasal inhaler Place 2 sprays into the nose  daily.     No current facility-administered medications for this visit.     Review of Systems Review of Systems  Constitutional: Negative.   Respiratory: Negative.   Cardiovascular: Negative.     Blood pressure 124/70, pulse 78, resp. rate 14, height 5' (1.524 m), weight 210 lb (95.3 kg).  Physical Exam Physical Exam  Constitutional: She is oriented to person, place, and time. She appears well-developed and well-nourished.  Cardiovascular:  Pulses:      Dorsalis pedis pulses are 2+ on the right side, and 2+ on the left side.       Posterior tibial pulses are 2+ on the right side, and 2+ on the left side.  Scant edema right leg and 1+ edema on the left leg. Stasis skin change inner aspect left ankle which in chronic, unchanged from before.  Abdominal: Soft. Normal appearance. There is no tenderness.  Lymphadenopathy:       Right: No inguinal adenopathy present.       Left: No inguinal  adenopathy present.  Neurological: She is alert and oriented to person, place, and time.  Skin: Skin is warm and dry.  Psychiatric: Her behavior is normal.    Data Reviewed Progress notes  Assessment    Stable varicose veins lower extremities. She uses compression hose daily and has had excellent control for many years now.    Plan    Continue with the use of compression hose. Follow up in one year. The patient is aware to call back for any questions or concerns.     This information has been scribed by Karie Fetch RN, BSN,BC.    Loranda Mastel G 11/28/2015, 10:59 AM

## 2015-12-20 ENCOUNTER — Other Ambulatory Visit: Payer: Self-pay | Admitting: Internal Medicine

## 2016-01-09 ENCOUNTER — Other Ambulatory Visit (INDEPENDENT_AMBULATORY_CARE_PROVIDER_SITE_OTHER): Payer: Medicare Other

## 2016-01-09 DIAGNOSIS — E7800 Pure hypercholesterolemia, unspecified: Secondary | ICD-10-CM

## 2016-01-09 LAB — HEPATIC FUNCTION PANEL
ALBUMIN: 4.2 g/dL (ref 3.5–5.2)
ALT: 25 U/L (ref 0–35)
AST: 23 U/L (ref 0–37)
Alkaline Phosphatase: 45 U/L (ref 39–117)
BILIRUBIN DIRECT: 0.1 mg/dL (ref 0.0–0.3)
Total Bilirubin: 0.9 mg/dL (ref 0.2–1.2)
Total Protein: 6.6 g/dL (ref 6.0–8.3)

## 2016-01-10 ENCOUNTER — Other Ambulatory Visit (INDEPENDENT_AMBULATORY_CARE_PROVIDER_SITE_OTHER): Payer: Medicare Other

## 2016-01-10 ENCOUNTER — Other Ambulatory Visit: Payer: Self-pay | Admitting: Internal Medicine

## 2016-01-10 DIAGNOSIS — E119 Type 2 diabetes mellitus without complications: Secondary | ICD-10-CM

## 2016-01-10 DIAGNOSIS — E7800 Pure hypercholesterolemia, unspecified: Secondary | ICD-10-CM | POA: Diagnosis not present

## 2016-01-10 LAB — BASIC METABOLIC PANEL
BUN: 16 mg/dL (ref 6–23)
CO2: 29 mEq/L (ref 19–32)
Calcium: 9.8 mg/dL (ref 8.4–10.5)
Chloride: 100 mEq/L (ref 96–112)
Creatinine, Ser: 1.01 mg/dL (ref 0.40–1.20)
GFR: 56.5 mL/min — AB (ref >60.00)
Glucose, Bld: 116 mg/dL — ABNORMAL HIGH (ref 70–99)
POTASSIUM: 4.5 meq/L (ref 3.5–5.1)
SODIUM: 138 meq/L (ref 135–145)

## 2016-01-10 LAB — LIPID PANEL
CHOLESTEROL: 174 mg/dL (ref 0–200)
HDL: 43.2 mg/dL (ref >39.00)
LDL Cholesterol: 95 mg/dL (ref 0–99)
NONHDL: 130.75
Total CHOL/HDL Ratio: 4
Triglycerides: 179 mg/dL — ABNORMAL HIGH (ref 0.0–149.0)
VLDL: 35.8 mg/dL (ref 0.0–40.0)

## 2016-01-10 NOTE — Progress Notes (Signed)
Orders placed for add on labs.  

## 2016-01-11 ENCOUNTER — Encounter: Payer: Self-pay | Admitting: Internal Medicine

## 2016-01-13 ENCOUNTER — Encounter: Payer: Self-pay | Admitting: Internal Medicine

## 2016-01-13 ENCOUNTER — Ambulatory Visit (INDEPENDENT_AMBULATORY_CARE_PROVIDER_SITE_OTHER): Payer: Medicare Other | Admitting: Internal Medicine

## 2016-01-13 VITALS — BP 130/72 | HR 65 | Temp 98.3°F | Ht 60.0 in | Wt 209.2 lb

## 2016-01-13 DIAGNOSIS — Z23 Encounter for immunization: Secondary | ICD-10-CM | POA: Diagnosis not present

## 2016-01-13 DIAGNOSIS — I10 Essential (primary) hypertension: Secondary | ICD-10-CM

## 2016-01-13 DIAGNOSIS — E119 Type 2 diabetes mellitus without complications: Secondary | ICD-10-CM | POA: Diagnosis not present

## 2016-01-13 DIAGNOSIS — E7800 Pure hypercholesterolemia, unspecified: Secondary | ICD-10-CM

## 2016-01-13 DIAGNOSIS — F439 Reaction to severe stress, unspecified: Secondary | ICD-10-CM

## 2016-01-13 DIAGNOSIS — M5441 Lumbago with sciatica, right side: Secondary | ICD-10-CM

## 2016-01-13 DIAGNOSIS — I8390 Asymptomatic varicose veins of unspecified lower extremity: Secondary | ICD-10-CM

## 2016-01-13 LAB — HEMOGLOBIN A1C: Hgb A1c MFr Bld: 7 % — ABNORMAL HIGH (ref 4.6–6.5)

## 2016-01-13 NOTE — Progress Notes (Signed)
Patient ID: Carrie Mills, female   DOB: [birth year], 75 y.o.   MRN: [id number]   Subjective:    Patient ID: Carrie Mills, female    DOB: [birth year], 75 y.o.   MRN: [id number]  HPI  Patient here for a scheduled follow up.   She is doing relatively well.  She has been having problems with lower back pain (right side pain).  Had mri.  Worse if sits too long.  Saw ortho.  Was given medrol dose pack.  Is better. Still with some issues.  Physical therapy.  Taking one alleve daily.  No chest pain.  No sob.  No significant problems with palpitations.  No acid reflux.  No abdominal pain or cramping.  Bowels stable.  No urine change.  Not been following diet as well.  Discussed lab results.     Past Medical History:  Diagnosis Date  . Allergy   . Colon polyps   . Hyperglycemia   . Hyperlipidemia   . Hypertension   . Phlebitis   . Varicose veins    Past Surgical History:  Procedure Laterality Date  . ABDOMINAL HYSTERECTOMY  1987  . APPENDECTOMY  1987  . COLECTOMY  2004  . COLONOSCOPY  2009  . COLONOSCOPY  12-27-12   Dr Jamal Collin  . REPLACEMENT TOTAL KNEE Left 06/05/13  . THROMBECTOMY    . TOTAL ABDOMINAL HYSTERECTOMY W/ BILATERAL SALPINGOOPHORECTOMY  1987   Family History  Problem Relation Age of Onset  . Arthritis Mother   . Heart disease Mother     s/p CABG  . Hypertension Mother   . Heart disease Father   . Hypertension Father   . Diabetes Father   . Breast cancer Neg Hx   . Colon cancer Neg Hx    Social History   Social History  . Marital status: Married    Spouse name: N/A  . Number of children: N/A  . Years of education: N/A   Social History Main Topics  . Smoking status: Never Smoker  . Smokeless tobacco: Never Used  . Alcohol use No  . Drug use: No  . Sexual activity: Not Asked   Other Topics Concern  . None   Social History Narrative  . None    Outpatient Encounter Prescriptions as of 01/13/2016  Medication Sig  . ACCU-CHEK AVIVA PLUS test strip  TEST BLOOD SUGAR ONCE A DAY OR AS INSTRUCTED. DX 250.0  . Calcium Carbonate Antacid 400 MG CHEW Chew by mouth daily. Reported on 05/14/2015  . citalopram (CELEXA) 10 MG tablet TAKE 1 TABLET DAILY  . Coconut Oil 1000 MG CAPS Take by mouth.  . fluticasone (FLONASE) 50 MCG/ACT nasal spray   . Glucosamine HCl 1000 MG TABS Take by mouth.  . hydrochlorothiazide (HYDRODIURIL) 25 MG tablet TAKE 1 TABLET DAILY  . metoprolol succinate (TOPROL-XL) 25 MG 24 hr tablet TAKE 1 TABLET TWICE A DAY  . mometasone (ELOCON) 0.1 % lotion Use as directed  . Montelukast Sodium (SINGULAIR PO) Take by mouth daily.  . Multiple Vitamin (MULTIVITAMIN) capsule Take 1 capsule by mouth daily.  . Omega-3 Fatty Acids (FISH OIL) 1200 MG CAPS Take by mouth daily.  Marland Kitchen penciclovir (DENAVIR) 1 % cream Apply to affected area qid  . pramoxine-hydrocortisone (ANALPRAM-HC) 1-1 % rectal cream Place 1 application rectally 2 (two) times daily.  . rosuvastatin (CRESTOR) 5 MG tablet TAKE 1 TABLET (5 MG TOTAL) BY MOUTH DAILY.  . sodium chloride (OCEAN) 0.65 % nasal spray Place 1  spray into the nose as needed.  . triamcinolone (NASACORT) 55 MCG/ACT nasal inhaler Place 2 sprays into the nose daily.   No facility-administered encounter medications on file as of 01/13/2016.     Review of Systems  Constitutional: Negative for appetite change and unexpected weight change.  HENT: Negative for congestion and sinus pressure.   Respiratory: Negative for cough, chest tightness and shortness of breath.   Cardiovascular: Negative for chest pain, palpitations and leg swelling.  Gastrointestinal: Negative for abdominal pain, nausea and vomiting.  Genitourinary: Negative for difficulty urinating and dysuria.  Musculoskeletal:       Back pain as outlined.  Is better.    Skin: Negative for color change and rash.  Neurological: Negative for dizziness, light-headedness and headaches.  Psychiatric/Behavioral: Negative for agitation and dysphoric mood.        Objective:     Blood pressure rechecked by me:  128/72  Physical Exam  Constitutional: She appears well-developed and well-nourished. No distress.  HENT:  Nose: Nose normal.  Mouth/Throat: Oropharynx is clear and moist.  Neck: Neck supple. No thyromegaly present.  Cardiovascular: Normal rate and regular rhythm.   Pulmonary/Chest: Breath sounds normal. No respiratory distress. She has no wheezes.  Abdominal: Soft. Bowel sounds are normal. There is no tenderness.  Musculoskeletal: She exhibits no edema or tenderness.  Compression hose in place.    Lymphadenopathy:    She has no cervical adenopathy.  Skin: No rash noted. No erythema.  Psychiatric: She has a normal mood and affect. Her behavior is normal.    BP 130/72   Pulse 65   Temp 98.3 F (36.8 C) (Oral)   Ht 5' (1.524 m)   Wt 209 lb 3.2 oz (94.9 kg)   SpO2 94%   BMI 40.86 kg/m  Wt Readings from Last 3 Encounters:  01/13/16 209 lb 3.2 oz (94.9 kg)  11/28/15 210 lb (95.3 kg)  09/13/15 204 lb (92.5 kg)     Lab Results  Component Value Date   WBC 8.5 01/07/2015   HGB 13.2 01/07/2015   HCT 40.0 01/07/2015   PLT 250.0 01/07/2015   GLUCOSE 116 (H) 01/10/2016   CHOL 174 01/10/2016   TRIG 179.0 (H) 01/10/2016   HDL 43.20 01/10/2016   LDLDIRECT 89.0 05/10/2015   LDLCALC 95 01/10/2016   ALT 25 01/09/2016   AST 23 01/09/2016   NA 138 01/10/2016   K 4.5 01/10/2016   CL 100 01/10/2016   CREATININE 1.01 01/10/2016   BUN 16 01/10/2016   CO2 29 01/10/2016   TSH 2.48 01/07/2015   INR 0.9 05/24/2013   HGBA1C 7.0 (H) 01/13/2016   MICROALBUR 0.4 09/25/2013    Mm Digital Screening Bilateral  Result Date: 10/07/2015 CLINICAL DATA:  Screening. EXAM: DIGITAL SCREENING BILATERAL MAMMOGRAM WITH CAD COMPARISON:  Previous exam(s). ACR Breast Density Category b: There are scattered areas of fibroglandular density. FINDINGS: There are no findings suspicious for malignancy. Images were processed with CAD. IMPRESSION: No  mammographic evidence of malignancy. A result letter of this screening mammogram will be mailed directly to the patient. RECOMMENDATION: Screening mammogram in one year. (Code:SM-B-01Y) BI-RADS CATEGORY  1: Negative. Electronically Signed   By: Marin Olp M.D.   On: 10/07/2015 14:32      Assessment & Plan:   Problem List Items Addressed This Visit    Asymptomatic varicose veins    Just saw Dr Jamal Collin.  Compression hose.  Follow.        Back pain  Is better.  Still notices.  Saw ortho.  Follow.        Diabetes ([place]) - Primary    Low carb diet and exercise.  Follow met b and a1c.  Check a1c today.        Relevant Orders   Hemoglobin A1c (Completed)   Basic metabolic panel   Hemoglobin A1c   Microalbumin / creatinine urine ratio   Hypercholesterolemia    Low cholesterol diet and exercise.  On crestor.  LDL just checked - 95.  Improved.  Follow lipid panel and liver function tests.        Relevant Orders   Lipid panel   Hepatic function panel   Hypertension    Blood pressure under good control.  Continue same medication regimen.  Follow pressures.  Follow metabolic panel.        Relevant Orders   CBC with Differential/Platelet   TSH   Stress    Increased stress as outlined.  Overall she feels she is handling things relatively well.  Follow.         Other Visit Diagnoses    Encounter for immunization       Relevant Orders   Flu vaccine HIGH DOSE PF (Completed)   Hemoglobin A1c (Completed)       Einar Pheasant, MD

## 2016-01-13 NOTE — Progress Notes (Signed)
Pre visit review using our clinic review tool, if applicable. No additional management support is needed unless otherwise documented below in the visit note. 

## 2016-01-14 ENCOUNTER — Encounter: Payer: Self-pay | Admitting: Internal Medicine

## 2016-01-19 ENCOUNTER — Encounter: Payer: Self-pay | Admitting: Internal Medicine

## 2016-01-19 NOTE — Assessment & Plan Note (Signed)
Low carb diet and exercise.  Follow met b and a1c.  Check a1c today.   

## 2016-01-19 NOTE — Assessment & Plan Note (Signed)
Increased stress as outlined.  Overall she feels she is handling things relatively well. Follow.   

## 2016-01-19 NOTE — Assessment & Plan Note (Signed)
Blood pressure under good control.  Continue same medication regimen.  Follow pressures.  Follow metabolic panel.   

## 2016-01-19 NOTE — Assessment & Plan Note (Signed)
Is better.  Still notices.  Saw ortho.  Follow.

## 2016-01-19 NOTE — Assessment & Plan Note (Signed)
Just saw Dr Jamal Collin.  Compression hose.  Follow.

## 2016-01-19 NOTE — Assessment & Plan Note (Signed)
Low cholesterol diet and exercise.  On crestor.  LDL just checked - 95.  Improved.  Follow lipid panel and liver function tests.

## 2016-01-21 ENCOUNTER — Other Ambulatory Visit: Payer: Self-pay

## 2016-01-21 MED ORDER — ROSUVASTATIN CALCIUM 5 MG PO TABS: 5.0000 mg | ORAL_TABLET | Freq: Every day | ORAL | 1 refills | Status: DC

## 2016-01-23 ENCOUNTER — Ambulatory Visit (INDEPENDENT_AMBULATORY_CARE_PROVIDER_SITE_OTHER): Payer: Medicare Other

## 2016-01-23 VITALS — BP 120/70 | HR 64 | Temp 98.1°F | Resp 14 | Ht 64.5 in | Wt 209.0 lb

## 2016-01-23 DIAGNOSIS — Z0000 Encounter for general adult medical examination without abnormal findings: Secondary | ICD-10-CM

## 2016-01-23 DIAGNOSIS — E2839 Other primary ovarian failure: Secondary | ICD-10-CM | POA: Diagnosis not present

## 2016-01-23 NOTE — Patient Instructions (Addendum)
  Carrie Mills , Thank you for taking time to come for your Medicare Wellness Visit. I appreciate your ongoing commitment to your health goals. Please review the following plan we discussed and let me know if I can assist you in the future.   FOLLOW UP WITH DR. Nicki Reaper AS NEEDED.  These are the goals we discussed: Goals    . Reduce portion size          Low carb foods.  Lean meats, vegetables.       This is a list of the screening recommended for you and due dates:  Health Maintenance  Topic Date Due  . DEXA scan (bone density measurement)  01/28/2004  . Urine Protein Check  09/26/2014  . Shingles Vaccine  01/21/2017*  . Tetanus Vaccine  01/21/2017*  . Hemoglobin A1C  07/13/2016  . Mammogram  10/06/2016  . Eye exam for diabetics  11/18/2016  . Complete foot exam   01/12/2017  . Flu Shot  Completed  . Pneumonia vaccines  Completed  *Topic was postponed. The date shown is not the original due date.   Bone Densitometry Bone densitometry is an imaging test that uses a special X-ray to measure the amount of calcium and other minerals in your bones (bone density). This test is also known as a bone mineral density test or dual-energy X-ray absorptiometry (DXA). The test can measure bone density at your hip and your spine. It is similar to having a regular X-ray. You may have this test to:  Diagnose a condition that causes weak or thin bones (osteoporosis).  Predict your risk of a broken bone (fracture).  Determine how well osteoporosis treatment is working. LET [facility] CARE PROVIDER KNOW ABOUT:  Any allergies you have.  All medicines you are taking, including vitamins, herbs, eye drops, creams, and over-the-counter medicines.  Previous problems you or members of your family have had with the use of anesthetics.  Any blood disorders you have.  Previous surgeries you have had.  Medical conditions you have.  Possibility of pregnancy.  Any other medical test you had within  the previous 14 days that used contrast material. RISKS AND COMPLICATIONS Generally, this is a safe procedure. However, problems can occur and may include the following:  This test exposes you to a very small amount of radiation.  The risks of radiation exposure may be greater to unborn children. BEFORE THE PROCEDURE  Do not take any calcium supplements for 24 hours before having the test. You can otherwise eat and drink what you usually do.  Take off all metal jewelry, eyeglasses, dental appliances, and any other metal objects. PROCEDURE  You may lie on an exam table. There will be an X-ray generator below you and an imaging device above you.  Other devices, such as boxes or braces, may be used to position your body properly for the scan.  You will need to lie still while the machine slowly scans your body.  The images will show up on a computer monitor. AFTER THE PROCEDURE You may need more testing at a later time.   This information is not intended to replace advice given to you by your health care provider. Make sure you discuss any questions you have with your health care provider.   Document Released: 03/24/2004 Document Revised: 03/23/2014 Document Reviewed: 08/10/2013 Elsevier Interactive Patient Education [employer].

## 2016-01-23 NOTE — Progress Notes (Signed)
Subjective:   Carrie Mills is a 75 y.o. female who presents for an Initial Medicare Annual Wellness Visit.  Review of Systems    No ROS.  Medicare Wellness Visit.  Cardiac Risk Factors include: advanced age (>2men, >29 women);diabetes mellitus;hypertension     Objective:    Today's Vitals   01/23/16 1040  BP: 120/70  Pulse: 64  Resp: 14  Temp: 98.1 F (36.7 C)  TempSrc: Oral  SpO2: 96%  Weight: 209 lb (94.8 kg)  Height: 5' 4.5" (1.638 m)   Body mass index is 35.32 kg/m.   Current Medications (verified) Outpatient Encounter Prescriptions as of 01/23/2016  Medication Sig  . ACCU-CHEK AVIVA PLUS test strip TEST BLOOD SUGAR ONCE A DAY OR AS INSTRUCTED. DX 250.0  . Calcium Carbonate Antacid 400 MG CHEW Chew by mouth daily. Reported on 05/14/2015  . citalopram (CELEXA) 10 MG tablet TAKE 1 TABLET DAILY  . Coconut Oil 1000 MG CAPS Take by mouth.  . fluticasone (FLONASE) 50 MCG/ACT nasal spray   . Glucosamine HCl 1000 MG TABS Take by mouth.  . hydrochlorothiazide (HYDRODIURIL) 25 MG tablet TAKE 1 TABLET DAILY  . metoprolol succinate (TOPROL-XL) 25 MG 24 hr tablet TAKE 1 TABLET TWICE A DAY  . mometasone (ELOCON) 0.1 % lotion Use as directed  . Montelukast Sodium (SINGULAIR PO) Take by mouth daily.  . Multiple Vitamin (MULTIVITAMIN) capsule Take 1 capsule by mouth daily.  . Omega-3 Fatty Acids (FISH OIL) 1200 MG CAPS Take by mouth daily.  Marland Kitchen penciclovir (DENAVIR) 1 % cream Apply to affected area qid  . pramoxine-hydrocortisone (ANALPRAM-HC) 1-1 % rectal cream Place 1 application rectally 2 (two) times daily.  . rosuvastatin (CRESTOR) 5 MG tablet Take 1 tablet (5 mg total) by mouth daily.  . sodium chloride (OCEAN) 0.65 % nasal spray Place 1 spray into the nose as needed.  . triamcinolone (NASACORT) 55 MCG/ACT nasal inhaler Place 2 sprays into the nose daily.   No facility-administered encounter medications on file as of 01/23/2016.     Allergies (verified) Sulfa  antibiotics   History: Past Medical History:  Diagnosis Date  . Allergy   . Colon polyps   . Hyperglycemia   . Hyperlipidemia   . Hypertension   . Phlebitis   . Varicose veins    Past Surgical History:  Procedure Laterality Date  . ABDOMINAL HYSTERECTOMY  1987  . APPENDECTOMY  1987  . COLECTOMY  2004  . COLONOSCOPY  2009  . COLONOSCOPY  12-27-12   Dr Jamal Collin  . REPLACEMENT TOTAL KNEE Left 06/05/13  . THROMBECTOMY    . TOTAL ABDOMINAL HYSTERECTOMY W/ BILATERAL SALPINGOOPHORECTOMY  1987   Family History  Problem Relation Age of Onset  . Arthritis Mother   . Heart disease Mother     s/p CABG  . Hypertension Mother   . Heart disease Father   . Hypertension Father   . Diabetes Father   . Breast cancer Neg Hx   . Colon cancer Neg Hx    Social History   Occupational History  . Not on file.   Social History Main Topics  . Smoking status: Never Smoker  . Smokeless tobacco: Never Used  . Alcohol use No  . Drug use: No  . Sexual activity: Not Currently    Tobacco Counseling Counseling given: Not Answered   Activities of Daily Living In your present state of health, do you have any difficulty performing the following activities: 01/23/2016  Hearing? N  Vision?  N  Difficulty concentrating or making decisions? N  Walking or climbing stairs? N  Dressing or bathing? N  Doing errands, shopping? N  Preparing Food and eating ? N  Using the Toilet? N  In the past six months, have you accidently leaked urine? N  Do you have problems with loss of bowel control? N  Managing your Medications? N  Managing your Finances? N  Housekeeping or managing your Housekeeping? N  Some recent data might be hidden    Immunizations and Health Maintenance Immunization History  Administered Date(s) Administered  . Influenza, High Dose Seasonal PF 01/13/2016  . Influenza-Unspecified 12/14/2013, 01/02/2015  . Pneumococcal Conjugate-13 12/08/2013  . Pneumococcal Polysaccharide-23  01/10/2015   Health Maintenance Due  Topic Date Due  . DEXA SCAN  01/28/2004  . URINE MICROALBUMIN  09/26/2014    Patient Care Team: Einar Pheasant, MD as PCP - General (Internal Medicine) Einar Pheasant, MD (Internal Medicine) Seeplaputhur Robinette Haines, MD (General Surgery) Einar Pheasant, MD (Internal Medicine)  Indicate any recent Medical Services you may have received from other than [facility] providers in the past year (date may be approximate).     Assessment:   This is a routine wellness examination for Carrie Mills. The goal of the wellness visit is to assist the patient how to close the gaps in care and create a preventative care plan for the patient.   Taking calcium VIT D as appropriate/Osteoporosis risk reviewed.  DEXA Scan ordered.  Follow as directed.  Medications reviewed; taking without issues or barriers.  Safety issues reviewed; lives with husband. 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  Smoke detectors in the home. Firearms locked within the home. Wears seatbelts when driving or riding with others. No violence in the home.  No identified risk were noted; The patient was oriented x 3; appropriate in dress and manner and no objective failures at ADL's or IADL's.   Body mass index; discussed the importance of a healthy diet, water intake and exercise. She plans to reduce portion size of servings while eating a low carb diet.  She has adequate water intake and participates in home physical therapy with bike exercises.  TDAP and ZOSTAVAX vaccine deferred for follow up with insurance.  Patient Concerns: None at this time. Follow up with PCP as needed.  Hearing/Vision screen Hearing Screening Comments: Passes the whisper test Vision Screening Comments: Followed by [facility] Wears glasses Last OV 11/2015  Dietary issues and exercise activities discussed: Current Exercise Habits: Home exercise routine (PT home exercises), Type of exercise: calisthenics (Bike), Time (Minutes):  35, Frequency (Times/Week): 4, Weekly Exercise (Minutes/Week): 140, Intensity: Mild  Goals    . Reduce portion size          Low carb foods.  Lean meats, vegetables.      Depression Screen PHQ 2/9 Scores 01/23/2016 01/13/2016 09/12/2014 05/04/2014 03/27/2013 01/17/2012  PHQ - 2 Score 0 0 0 0 0 0    Fall Risk Fall Risk  01/23/2016 01/13/2016 09/12/2014 05/04/2014 03/27/2013  Falls in the past year? Yes No No Yes No  Number falls in past yr: 1 - - 1 -  Injury with Fall? - - - No -  Follow up Falls prevention discussed;Education provided - - - -    Cognitive Function: MMSE - Mini Mental State Exam 01/23/2016  Orientation to time 5  Orientation to Place 5  Registration 3  Attention/ Calculation 5  Recall 3  Language- name 2 objects 2  Language- repeat 1  Language- follow  3 step command 3  Language- read & follow direction 1  Write a sentence 1  Copy design 1  Total score 30     6CIT Screen 01/23/2016  What Year? 0 points  What month? 0 points  What time? 0 points  Count back from 20 0 points  Months in reverse 0 points    Screening Tests Health Maintenance  Topic Date Due  . DEXA SCAN  01/28/2004  . URINE MICROALBUMIN  09/26/2014  . ZOSTAVAX  01/21/2017 (Originally 01/28/1999)  . TETANUS/TDAP  01/21/2017 (Originally 01/27/1958)  . HEMOGLOBIN A1C  07/13/2016  . MAMMOGRAM  10/06/2016  . OPHTHALMOLOGY EXAM  11/18/2016  . FOOT EXAM  01/12/2017  . INFLUENZA VACCINE  Completed  . PNA vac Low Risk Adult  Completed      Plan:    End of life planning; Advance aging; Advanced directives discussed. Copy of current HCPOA/Living Will requested.  Medicare Attestation I have personally reviewed: The patient's medical and social history Their use of alcohol, tobacco or illicit drugs Their current medications and supplements The patient's functional ability including ADLs,fall risks, home safety risks, cognitive, and hearing and visual impairment Diet and physical  activities Evidence for depression   The patient's weight, height, BMI, and visual acuity have been recorded in the chart.  I have made referrals and provided education to the patient based on review of the above and I have provided the patient with a written personalized care plan for preventive services.    During the course of the visit, Quiona was educated and counseled about the following appropriate screening and preventive services:   Vaccines to include Pneumoccal, Influenza, Hepatitis B, Td, Zostavax, HCV  Electrocardiogram  Cardiovascular disease screening  Colorectal cancer screening  Bone density screening  Diabetes screening  Glaucoma screening  Mammography/PAP  Nutrition counseling  Smoking cessation counseling  Patient Instructions (the written plan) were given to the patient.    Varney Biles, LPN   [id number]    Reviewed above information.  Agree with plan.  Dr Nicki Reaper

## 2016-01-29 DIAGNOSIS — X32XXXA Exposure to sunlight, initial encounter: Secondary | ICD-10-CM | POA: Diagnosis not present

## 2016-01-29 DIAGNOSIS — L218 Other seborrheic dermatitis: Secondary | ICD-10-CM | POA: Diagnosis not present

## 2016-01-29 DIAGNOSIS — L304 Erythema intertrigo: Secondary | ICD-10-CM | POA: Diagnosis not present

## 2016-01-29 DIAGNOSIS — Z08 Encounter for follow-up examination after completed treatment for malignant neoplasm: Secondary | ICD-10-CM | POA: Diagnosis not present

## 2016-01-29 DIAGNOSIS — L570 Actinic keratosis: Secondary | ICD-10-CM | POA: Diagnosis not present

## 2016-01-29 DIAGNOSIS — Z85828 Personal history of other malignant neoplasm of skin: Secondary | ICD-10-CM | POA: Diagnosis not present

## 2016-02-05 IMAGING — CR DG KNEE 1-2V*L*
1 series · 2 of 2 positions shown · non-contrast
Comparison: None.

CLINICAL DATA: Postoperative left total knee joint replacement.

EXAM:
LEFT KNEE - 1-2 VIEW

[Series 1: ap · 0.17mm/px · 2 of 2 slices shown]
[im 1/2]
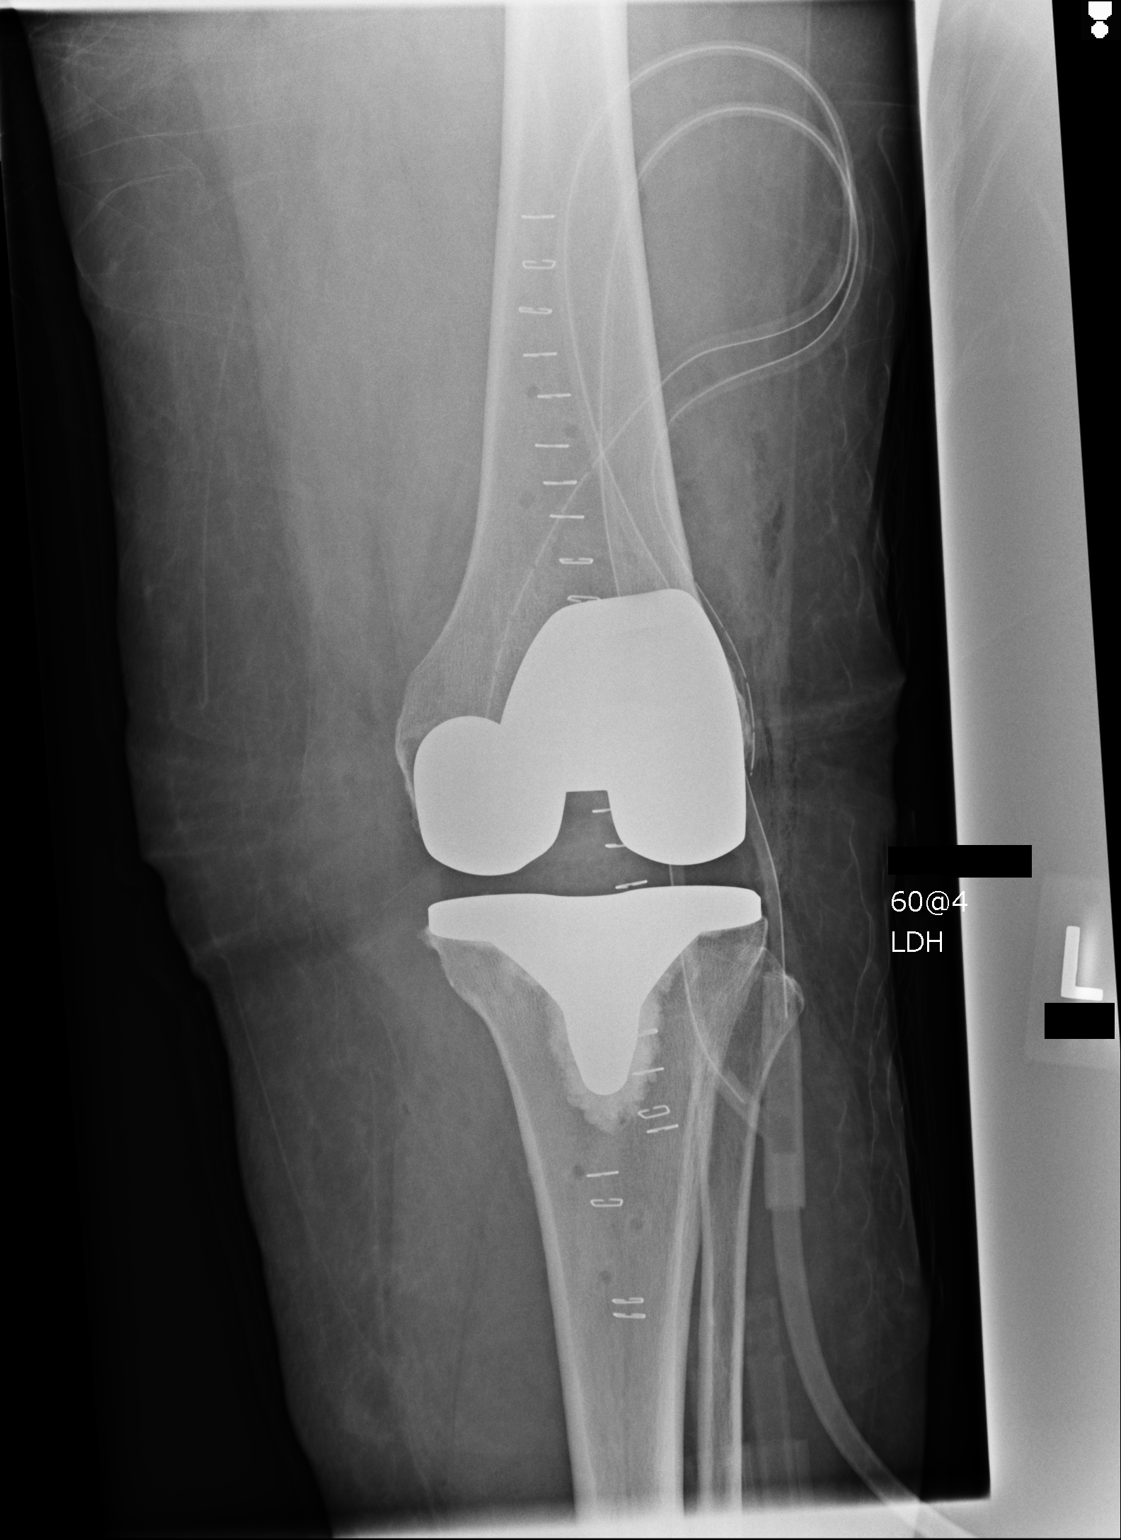
[im 2/2]
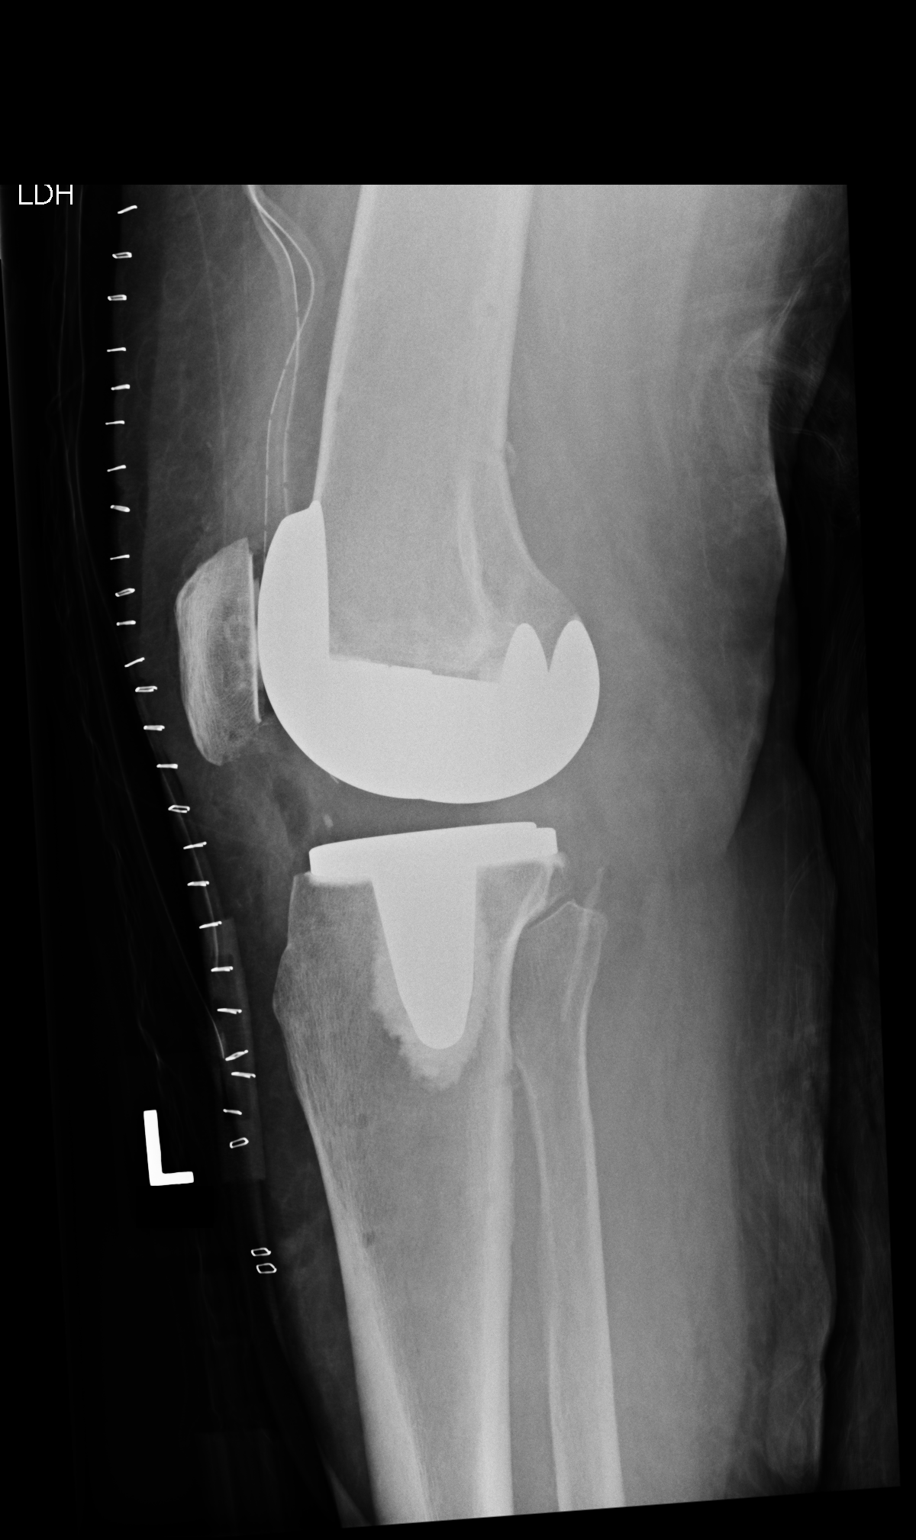

[2 of 2 positions shown; findings below may reference images not displayed]

FINDINGS: AP and lateral portable views of the left knee reveal the presence
of a prosthetic joint. Radiographic positioning of the prosthetic
components is good. The interface with the native bone appears
normal. There are 2 surgical drain lines present anteriorly. There
is a small amount of gas within the anterior soft tissues.
IMPRESSION: The patient has undergone left total knee joint replacement. There
is no evidence of an immediate postprocedure complication.

## 2016-02-27 DIAGNOSIS — Z96652 Presence of left artificial knee joint: Secondary | ICD-10-CM | POA: Diagnosis not present

## 2016-03-03 ENCOUNTER — Encounter: Payer: Self-pay | Admitting: Internal Medicine

## 2016-04-09 ENCOUNTER — Other Ambulatory Visit: Payer: Self-pay | Admitting: Orthopedic Surgery

## 2016-04-09 DIAGNOSIS — M5440 Lumbago with sciatica, unspecified side: Principal | ICD-10-CM

## 2016-04-09 DIAGNOSIS — G8929 Other chronic pain: Secondary | ICD-10-CM

## 2016-04-15 ENCOUNTER — Ambulatory Visit
Admission: RE | Admit: 2016-04-15 | Discharge: 2016-04-15 | Disposition: A | Discharge status: Home / Self Care | Payer: Medicare Other | Source: Ambulatory Visit | Attending: Internal Medicine | Admitting: Internal Medicine

## 2016-04-15 DIAGNOSIS — M85852 Other specified disorders of bone density and structure, left thigh: Secondary | ICD-10-CM | POA: Diagnosis not present

## 2016-04-15 DIAGNOSIS — E2839 Other primary ovarian failure: Secondary | ICD-10-CM | POA: Diagnosis present

## 2016-04-16 ENCOUNTER — Encounter: Payer: Self-pay | Admitting: Internal Medicine

## 2016-04-16 ENCOUNTER — Ambulatory Visit
Admission: RE | Admit: 2016-04-16 | Discharge: 2016-04-16 | Disposition: A | Discharge status: Home / Self Care | Payer: Medicare Other | Source: Ambulatory Visit | Attending: Orthopedic Surgery | Admitting: Orthopedic Surgery

## 2016-04-16 DIAGNOSIS — M48061 Spinal stenosis, lumbar region without neurogenic claudication: Secondary | ICD-10-CM | POA: Insufficient documentation

## 2016-04-16 DIAGNOSIS — M47816 Spondylosis without myelopathy or radiculopathy, lumbar region: Secondary | ICD-10-CM | POA: Diagnosis not present

## 2016-04-16 DIAGNOSIS — G8929 Other chronic pain: Secondary | ICD-10-CM | POA: Diagnosis present

## 2016-04-16 DIAGNOSIS — M5440 Lumbago with sciatica, unspecified side: Secondary | ICD-10-CM | POA: Diagnosis present

## 2016-04-16 DIAGNOSIS — M545 Low back pain: Secondary | ICD-10-CM | POA: Diagnosis not present

## 2016-04-20 ENCOUNTER — Telehealth: Payer: Self-pay

## 2016-04-20 MED ORDER — HYDROCHLOROTHIAZIDE 25 MG PO TABS: 25.0000 mg | ORAL_TABLET | Freq: Every day | ORAL | 0 refills | Status: DC

## 2016-04-20 MED ORDER — METOPROLOL SUCCINATE ER 25 MG PO TB24: 25.0000 mg | ORAL_TABLET | Freq: Two times a day (BID) | ORAL | 0 refills | Status: DC

## 2016-04-20 MED ORDER — CITALOPRAM HYDROBROMIDE 10 MG PO TABS: 10.0000 mg | ORAL_TABLET | Freq: Every day | ORAL | 0 refills | Status: DC

## 2016-05-19 ENCOUNTER — Other Ambulatory Visit (INDEPENDENT_AMBULATORY_CARE_PROVIDER_SITE_OTHER): Payer: Medicare Other

## 2016-05-19 DIAGNOSIS — E7800 Pure hypercholesterolemia, unspecified: Secondary | ICD-10-CM

## 2016-05-19 DIAGNOSIS — I10 Essential (primary) hypertension: Secondary | ICD-10-CM

## 2016-05-19 DIAGNOSIS — E119 Type 2 diabetes mellitus without complications: Secondary | ICD-10-CM

## 2016-05-19 LAB — BASIC METABOLIC PANEL
BUN: 17 mg/dL (ref 6–23)
CALCIUM: 9.5 mg/dL (ref 8.4–10.5)
CHLORIDE: 102 meq/L (ref 96–112)
CO2: 34 mEq/L — ABNORMAL HIGH (ref 19–32)
CREATININE: 1.02 mg/dL (ref 0.40–1.20)
GFR: 55.81 mL/min — ABNORMAL LOW (ref >60.00)
Glucose, Bld: 115 mg/dL — ABNORMAL HIGH (ref 70–99)
Potassium: 4.5 mEq/L (ref 3.5–5.1)
Sodium: 139 mEq/L (ref 135–145)

## 2016-05-19 LAB — HEMOGLOBIN A1C: HEMOGLOBIN A1C: 6.7 % — AB (ref 4.6–6.5)

## 2016-05-19 LAB — CBC WITH DIFFERENTIAL/PLATELET
BASOS PCT: 0.8 % (ref 0.0–3.0)
Basophils Absolute: 0.1 10*3/uL (ref 0.0–0.1)
EOS ABS: 0.6 10*3/uL (ref 0.0–0.7)
EOS PCT: 7.4 % — AB (ref 0.0–5.0)
HCT: 38.2 % (ref 36.0–46.0)
Hemoglobin: 12.7 g/dL (ref 12.0–15.0)
LYMPHS ABS: 2.3 10*3/uL (ref 0.7–4.0)
Lymphocytes Relative: 29 % (ref 12.0–46.0)
MCHC: 33.2 g/dL (ref 30.0–36.0)
MCV: 89.6 fl (ref 78.0–100.0)
MONO ABS: 0.6 10*3/uL (ref 0.1–1.0)
Monocytes Relative: 7.7 % (ref 3.0–12.0)
NEUTROS PCT: 55.1 % (ref 43.0–77.0)
Neutro Abs: 4.4 10*3/uL (ref 1.4–7.7)
PLATELETS: 272 10*3/uL (ref 150.0–400.0)
RBC: 4.26 Mil/uL (ref 3.87–5.11)
RDW: 13.9 % (ref 11.5–15.5)
WBC: 8 10*3/uL (ref 4.0–10.5)

## 2016-05-19 LAB — HEPATIC FUNCTION PANEL
ALK PHOS: 46 U/L (ref 39–117)
ALT: 19 U/L (ref 0–35)
AST: 19 U/L (ref 0–37)
Albumin: 3.9 g/dL (ref 3.5–5.2)
BILIRUBIN DIRECT: 0.1 mg/dL (ref 0.0–0.3)
TOTAL PROTEIN: 6.4 g/dL (ref 6.0–8.3)
Total Bilirubin: 0.8 mg/dL (ref 0.2–1.2)

## 2016-05-19 LAB — TSH: TSH: 3.18 u[IU]/mL (ref 0.35–4.50)

## 2016-05-19 LAB — LIPID PANEL
CHOL/HDL RATIO: 4
CHOLESTEROL: 162 mg/dL (ref 0–200)
HDL: 41.3 mg/dL (ref >39.00)
LDL Cholesterol: 88 mg/dL (ref 0–99)
NonHDL: 120.58
TRIGLYCERIDES: 165 mg/dL — AB (ref 0.0–149.0)
VLDL: 33 mg/dL (ref 0.0–40.0)

## 2016-05-19 NOTE — Addendum Note (Signed)
Addended by: Arby Barrette on: 05/19/2016 10:27 AM   Modules accepted: Orders

## 2016-05-21 ENCOUNTER — Encounter: Payer: Self-pay | Admitting: Internal Medicine

## 2016-05-21 ENCOUNTER — Ambulatory Visit (INDEPENDENT_AMBULATORY_CARE_PROVIDER_SITE_OTHER): Payer: Medicare Other | Admitting: Internal Medicine

## 2016-05-21 VITALS — BP 120/68 | HR 55 | Temp 98.3°F | Resp 16 | Ht 65.0 in | Wt 211.2 lb

## 2016-05-21 DIAGNOSIS — M5441 Lumbago with sciatica, right side: Secondary | ICD-10-CM

## 2016-05-21 DIAGNOSIS — F439 Reaction to severe stress, unspecified: Secondary | ICD-10-CM

## 2016-05-21 DIAGNOSIS — Z0000 Encounter for general adult medical examination without abnormal findings: Secondary | ICD-10-CM

## 2016-05-21 DIAGNOSIS — E7800 Pure hypercholesterolemia, unspecified: Secondary | ICD-10-CM | POA: Diagnosis not present

## 2016-05-21 DIAGNOSIS — E119 Type 2 diabetes mellitus without complications: Secondary | ICD-10-CM | POA: Diagnosis not present

## 2016-05-21 DIAGNOSIS — I8390 Asymptomatic varicose veins of unspecified lower extremity: Secondary | ICD-10-CM

## 2016-05-21 DIAGNOSIS — I10 Essential (primary) hypertension: Secondary | ICD-10-CM

## 2016-05-21 LAB — MICROALBUMIN / CREATININE URINE RATIO
Creatinine,U: 53.4 mg/dL
MICROALB/CREAT RATIO: 1.3 mg/g (ref 0.0–30.0)
Microalb, Ur: <0.7 mg/dL (ref 0.0–1.9)

## 2016-05-21 LAB — HM DIABETES FOOT EXAM

## 2016-05-21 NOTE — Progress Notes (Signed)
Patient ID: Carrie Mills, female   DOB: 08/26/1940, 76 y.o.   MRN: [id number]   Subjective:    Patient ID: Carrie Mills, female    DOB: 1940-05-27, 76 y.o.   MRN: [id number]  HPI  Patient here for her physical exam.  She reports she is doing relatively well.  Increased stress with her husband's health issues.  Discussed with her today.  She feels she is handling things relatively well.  Does not feel needs anything more at this time.  Tries to stay active.  No chest pain.  No sob.  No acid reflux.  No abdominal pain or cramping.  Bowels stable.  She is riding her bike 3-4 miles per day.  Saw Dr Marry Guan 04/23/16.  Spinal stenosis.  Referred to Dr Sharlet Salina.  Due to see him next week.  Back does limit her activity.     Past Medical History:  Diagnosis Date  . Allergy   . Colon polyps   . Hyperglycemia   . Hyperlipidemia   . Hypertension   . Phlebitis   . Varicose veins    Past Surgical History:  Procedure Laterality Date  . ABDOMINAL HYSTERECTOMY  1987  . APPENDECTOMY  1987  . COLECTOMY  2004  . COLONOSCOPY  2009  . COLONOSCOPY  12-27-12   Dr Jamal Collin  . REPLACEMENT TOTAL KNEE Left 06/05/13  . THROMBECTOMY    . TOTAL ABDOMINAL HYSTERECTOMY W/ BILATERAL SALPINGOOPHORECTOMY  1987   Family History  Problem Relation Age of Onset  . Arthritis Mother   . Heart disease Mother     s/p CABG  . Hypertension Mother   . Heart disease Father   . Hypertension Father   . Diabetes Father   . Breast cancer Neg Hx   . Colon cancer Neg Hx    Social History   Social History  . Marital status: Married    Spouse name: N/A  . Number of children: N/A  . Years of education: N/A   Social History Main Topics  . Smoking status: Never Smoker  . Smokeless tobacco: Never Used  . Alcohol use No  . Drug use: No  . Sexual activity: Not Currently   Other Topics Concern  . None   Social History Narrative  . None    Outpatient Encounter Prescriptions as of 05/21/2016  Medication Sig  .  Calcium Carbonate Antacid 400 MG CHEW Chew by mouth daily. Reported on 05/14/2015  . citalopram (CELEXA) 10 MG tablet Take 1 tablet (10 mg total) by mouth daily.  . Coconut Oil 1000 MG CAPS Take by mouth.  . fluticasone (FLONASE) 50 MCG/ACT nasal spray   . Glucosamine HCl 1000 MG TABS Take by mouth.  Marland Kitchen glucose blood (ACCU-CHEK AVIVA PLUS) test strip TEST BLOOD SUGAR ONCE A DAY OR AS INSTRUCTED. DX 250.0  . hydrochlorothiazide (HYDRODIURIL) 25 MG tablet Take 1 tablet (25 mg total) by mouth daily.  . metoprolol succinate (TOPROL-XL) 25 MG 24 hr tablet Take 1 tablet (25 mg total) by mouth 2 (two) times daily.  . mometasone (ELOCON) 0.1 % lotion Use as directed  . Montelukast Sodium (SINGULAIR PO) Take by mouth daily.  . Multiple Vitamin (MULTIVITAMIN) capsule Take 1 capsule by mouth daily.  . Omega-3 Fatty Acids (FISH OIL) 1200 MG CAPS Take by mouth daily.  Marland Kitchen penciclovir (DENAVIR) 1 % cream Apply to affected area qid  . pramoxine-hydrocortisone (ANALPRAM-HC) 1-1 % rectal cream Place 1 application rectally 2 (two) times daily.  . rosuvastatin (  CRESTOR) 5 MG tablet Take 1 tablet (5 mg total) by mouth daily.  . sodium chloride (OCEAN) 0.65 % nasal spray Place 1 spray into the nose as needed.  . triamcinolone (NASACORT) 55 MCG/ACT nasal inhaler Place 2 sprays into the nose daily.  . [DISCONTINUED] ACCU-CHEK AVIVA PLUS test strip TEST BLOOD SUGAR ONCE A DAY OR AS INSTRUCTED. DX 250.0  . [DISCONTINUED] citalopram (CELEXA) 10 MG tablet Take 1 tablet (10 mg total) by mouth daily.  . [DISCONTINUED] hydrochlorothiazide (HYDRODIURIL) 25 MG tablet Take 1 tablet (25 mg total) by mouth daily.  . [DISCONTINUED] metoprolol succinate (TOPROL-XL) 25 MG 24 hr tablet Take 1 tablet (25 mg total) by mouth 2 (two) times daily.  . [DISCONTINUED] mometasone (ELOCON) 0.1 % lotion Use as directed  . [DISCONTINUED] rosuvastatin (CRESTOR) 5 MG tablet Take 1 tablet (5 mg total) by mouth daily.   No facility-administered  encounter medications on file as of 05/21/2016.     Review of Systems  Constitutional: Negative for appetite change and unexpected weight change.  HENT: Negative for congestion and sinus pressure.   Eyes: Negative for pain and visual disturbance.  Respiratory: Negative for cough, chest tightness and shortness of breath.   Cardiovascular: Negative for chest pain and palpitations.       Leg swelling - doing well.  Wears compression hose.    Gastrointestinal: Negative for abdominal pain, diarrhea, nausea and vomiting.  Genitourinary: Negative for difficulty urinating and dysuria.  Musculoskeletal: Positive for back pain. Negative for joint swelling.  Skin: Negative for color change and rash.  Neurological: Negative for dizziness, light-headedness and headaches.  Hematological: Negative for adenopathy. Does not bruise/bleed easily.  Psychiatric/Behavioral: Negative for agitation and dysphoric mood.       Objective:    Physical Exam  Constitutional: She is oriented to person, place, and time. She appears well-developed and well-nourished. No distress.  HENT:  Nose: Nose normal.  Mouth/Throat: Oropharynx is clear and moist.  Eyes: Right eye exhibits no discharge. Left eye exhibits no discharge. No scleral icterus.  Neck: Neck supple. No thyromegaly present.  Cardiovascular: Normal rate and regular rhythm.   Pulmonary/Chest: Breath sounds normal. No accessory muscle usage. No tachypnea. No respiratory distress. She has no decreased breath sounds. She has no wheezes. She has no rhonchi. Right breast exhibits no inverted nipple, no mass, no nipple discharge and no tenderness (no axillary adenopathy). Left breast exhibits no inverted nipple, no mass, no nipple discharge and no tenderness (no axilarry adenopathy).  Abdominal: Soft. Bowel sounds are normal. There is no tenderness.  Genitourinary:  Genitourinary Comments: Normal external genitalia.  Vaginal vault without lesions.  Pap smear  performed.  Could not appreciate any adnexal masses or tenderness.    Musculoskeletal: She exhibits no tenderness.  No increased edema.   Lymphadenopathy:    She has no cervical adenopathy.  Neurological: She is alert and oriented to person, place, and time.  Skin: Skin is warm. No rash noted. No erythema.  Psychiatric: She has a normal mood and affect. Her behavior is normal.    BP 120/68 (BP Location: Left Arm, Patient Position: Sitting, Cuff Size: Large)   Pulse (!) 55   Temp 98.3 F (36.8 C) (Oral)   Resp 16   Ht _0  (1.651 m)   Wt 211 lb 3.2 oz (95.8 kg)   SpO2 94%   BMI 35.15 kg/m  Wt Readings from Last 3 Encounters:  05/21/16 211 lb 3.2 oz (95.8 kg)  01/23/16 209 lb (94.8  kg)  01/13/16 209 lb 3.2 oz (94.9 kg)     Lab Results  Component Value Date   WBC 8.0 05/19/2016   HGB 12.7 05/19/2016   HCT 38.2 05/19/2016   PLT 272.0 05/19/2016   GLUCOSE 115 (H) 05/19/2016   CHOL 162 05/19/2016   TRIG 165.0 (H) 05/19/2016   HDL 41.30 05/19/2016   LDLDIRECT 89.0 05/10/2015   LDLCALC 88 05/19/2016   ALT 19 05/19/2016   AST 19 05/19/2016   NA 139 05/19/2016   K 4.5 05/19/2016   CL 102 05/19/2016   CREATININE 1.02 05/19/2016   BUN 17 05/19/2016   CO2 34 (H) 05/19/2016   TSH 3.18 05/19/2016   INR 0.9 05/24/2013   HGBA1C 6.7 (H) 05/19/2016   MICROALBUR <0.7 05/21/2016    Mr Lumbar Spine Wo Contrast  Result Date: 04/16/2016 CLINICAL DATA:  Chronic low back pain with sciatica. EXAM: MRI LUMBAR SPINE WITHOUT CONTRAST TECHNIQUE: Multiplanar, multisequence MR imaging of the lumbar spine was performed. No intravenous contrast was administered. COMPARISON:  None. FINDINGS: Segmentation:  Standard. Alignment:  Mild facet mediated anterolisthesis at L4-5. Vertebrae: Mild marrow edema about the right L4-5 facet which is degenerative appearing. No acute fracture, discitis, or aggressive bone lesion. Conus medullaris: Extends to the L1-2 disc level and appears normal. Paraspinal and  other soft tissues: Negative Disc levels: T12- L1: Unremarkable. L1-L2: Mild disc narrowing and bulging.  No impingement L2-L3: Disc narrowing with asymmetric rightward bulging. Facet arthropathy with bony and ligamentous hypertrophy. Advanced spinal stenosis. Mild to moderate biforaminal narrowing. L3-L4: Degenerative disc narrowing and bulging. Facet and ligament hypertrophy. Moderate spinal stenosis with near complete effacement of CSF. Inferior foraminal narrowing worse on the right without L3 compression. L4-L5: Severe facet arthropathy with mild anterolisthesis. Bulky spurring and ligament thickening, spurring worse on the right. Disc bulging that is right eccentric. Severe spinal stenosis. Moderate foraminal narrowing. L5-S1:Advanced degenerative disc narrowing. Mild facet spurring. Subarticular recess narrowing without definite S1 compression. Foraminal narrowing without L5 compression. IMPRESSION: 1. L4-5 severe spinal stenosis, primarily related to facet arthropathy with marked overgrowth and mild anterolisthesis. 2. L2-3 high-grade spinal stenosis. 3. L3-4 moderate spinal stenosis. 4. Mild to moderate bilateral foraminal narrowing from L2-3 to L5-S1. Electronically Signed   By: Monte Fantasia M.D.   On: 04/16/2016 15:50       Assessment & Plan:   Problem List Items Addressed This Visit    Asymptomatic varicose veins    Sees Dr Jamal Collin.  Compression hose.  Stable.       Relevant Medications   metoprolol succinate (TOPROL-XL) 25 MG 24 hr tablet   hydrochlorothiazide (HYDRODIURIL) 25 MG tablet   rosuvastatin (CRESTOR) 5 MG tablet   Back pain    Saw Dr Marry Guan.  MRI as outlined.  Spinal stenosis.  Limits her activity.  Referred to Dr Sharlet Salina.  Due to see him next week.        Diabetes ([place])    Low carb diet and exercise.  Follow met b and a1c.  Up to date with eye checks.        Relevant Medications   rosuvastatin (CRESTOR) 5 MG tablet   Other Relevant Orders   Hemoglobin Z0Y   Basic  metabolic panel   Health care maintenance    Physical today 05/21/16.  PAP 05/21/16.  Mammogram 10/07/15 - Birads I.  Colonoscopy 06/2012.  Recommended f/u in five years.        Relevant Orders   Cytology - PAP  Hypercholesterolemia    Low cholesterol die tand exercise.  Follow lipid panel and liver function tests.  On crestor.   Lab Results  Component Value Date   CHOL 162 05/19/2016   HDL 41.30 05/19/2016   LDLCALC 88 05/19/2016   LDLDIRECT 89.0 05/10/2015   TRIG 165.0 (H) 05/19/2016   CHOLHDL 4 05/19/2016        Relevant Medications   metoprolol succinate (TOPROL-XL) 25 MG 24 hr tablet   hydrochlorothiazide (HYDRODIURIL) 25 MG tablet   rosuvastatin (CRESTOR) 5 MG tablet   Other Relevant Orders   Hepatic function panel   Lipid panel   Hypertension    Blood pressure under good control.  Continue same medication regimen.  Follow pressures.  Follow metabolic panel.        Relevant Medications   metoprolol succinate (TOPROL-XL) 25 MG 24 hr tablet   hydrochlorothiazide (HYDRODIURIL) 25 MG tablet   rosuvastatin (CRESTOR) 5 MG tablet   Stress    Discussed with her today.  Overall she feels she is handling things relatively well.  Does not feel needs anything more at this time.  Follow.        Other Visit Diagnoses    Routine general medical examination at a health care facility    -  Primary       Einar Pheasant, MD

## 2016-05-21 NOTE — Assessment & Plan Note (Addendum)
Physical today 05/21/16.  PAP 05/21/16.  Mammogram 10/07/15 - Birads I.  Colonoscopy 06/2012.  Recommended f/u in five years.

## 2016-05-21 NOTE — Progress Notes (Signed)
Pre-visit discussion using our clinic review tool. No additional management support is needed unless otherwise documented below in the visit note.  

## 2016-05-22 ENCOUNTER — Other Ambulatory Visit (HOSPITAL_COMMUNITY)
Admission: RE | Admit: 2016-05-22 | Discharge: 2016-05-22 | Disposition: A | Discharge status: Home / Self Care | Payer: Medicare Other | Source: Ambulatory Visit | Attending: Internal Medicine | Admitting: Internal Medicine

## 2016-05-22 ENCOUNTER — Encounter: Payer: Self-pay | Admitting: Internal Medicine

## 2016-05-22 DIAGNOSIS — Z1151 Encounter for screening for human papillomavirus (HPV): Secondary | ICD-10-CM | POA: Diagnosis present

## 2016-05-22 DIAGNOSIS — Z01419 Encounter for gynecological examination (general) (routine) without abnormal findings: Secondary | ICD-10-CM | POA: Diagnosis present

## 2016-05-22 MED ORDER — CITALOPRAM HYDROBROMIDE 10 MG PO TABS
10.0000 mg | ORAL_TABLET | Freq: Every day | ORAL | 0 refills | Status: DC
Start: 2016-05-22 — End: 2016-10-24

## 2016-05-22 MED ORDER — GLUCOSE BLOOD VI STRP: ORAL_STRIP | 6 refills | Status: DC

## 2016-05-22 MED ORDER — METOPROLOL SUCCINATE ER 25 MG PO TB24: 25.0000 mg | ORAL_TABLET | Freq: Two times a day (BID) | ORAL | 0 refills | Status: DC

## 2016-05-22 MED ORDER — MOMETASONE FUROATE 0.1 % EX SOLN: CUTANEOUS | 0 refills | Status: DC

## 2016-05-22 MED ORDER — HYDROCHLOROTHIAZIDE 25 MG PO TABS: 25.0000 mg | ORAL_TABLET | Freq: Every day | ORAL | 0 refills | Status: DC

## 2016-05-22 MED ORDER — ROSUVASTATIN CALCIUM 5 MG PO TABS: 5.0000 mg | ORAL_TABLET | Freq: Every day | ORAL | 1 refills | Status: DC

## 2016-05-22 NOTE — Telephone Encounter (Signed)
Let me know which meter and I can send script.

## 2016-05-25 ENCOUNTER — Encounter: Payer: Self-pay | Admitting: Internal Medicine

## 2016-05-25 LAB — CYTOLOGY - PAP
Diagnosis: NEGATIVE
HPV (WINDOPATH): NOT DETECTED

## 2016-05-25 NOTE — Telephone Encounter (Signed)
See attached message.   Thanks

## 2016-05-25 NOTE — Telephone Encounter (Signed)
I checked and we do not have the one touch verio meters.  Please confirm with pt (she had a paper) that this meter is covered - one touch verio.  If so, ok to send in rx for one touch verio with test strips to check sugar bid (3 months supply) with one year refill.  Thanks.

## 2016-05-25 NOTE — Assessment & Plan Note (Signed)
Saw Dr Marry Guan.  MRI as outlined.  Spinal stenosis.  Limits her activity.  Referred to Dr Sharlet Salina.  Due to see him next week.

## 2016-05-25 NOTE — Assessment & Plan Note (Signed)
Low cholesterol die tand exercise.  Follow lipid panel and liver function tests.  On crestor.   Lab Results  Component Value Date   CHOL 162 05/19/2016   HDL 41.30 05/19/2016   LDLCALC 88 05/19/2016   LDLDIRECT 89.0 05/10/2015   TRIG 165.0 (H) 05/19/2016   CHOLHDL 4 05/19/2016

## 2016-05-25 NOTE — Assessment & Plan Note (Signed)
Low carb diet and exercise.  Follow met b and a1c.  Up to date with eye checks.

## 2016-05-25 NOTE — Assessment & Plan Note (Signed)
Sees Dr Jamal Collin.  Compression hose.  Stable.

## 2016-05-25 NOTE — Assessment & Plan Note (Signed)
Blood pressure under good control.  Continue same medication regimen.  Follow pressures.  Follow metabolic panel.   

## 2016-05-25 NOTE — Assessment & Plan Note (Signed)
Discussed with her today.  Overall she feels she is handling things relatively well.  Does not feel needs anything more at this time.  Follow.   

## 2016-08-14 ENCOUNTER — Other Ambulatory Visit: Payer: Self-pay | Admitting: Internal Medicine

## 2016-10-20 ENCOUNTER — Encounter: Payer: Self-pay | Admitting: Internal Medicine

## 2016-10-21 MED ORDER — GLUCOSE BLOOD VI STRP: ORAL_STRIP | 6 refills | Status: DC

## 2016-10-21 MED ORDER — BLOOD GLUCOSE MONITOR KIT
PACK | 0 refills | Status: DC
Start: 2016-10-21 — End: 2016-11-18

## 2016-10-21 NOTE — Telephone Encounter (Signed)
I have sent the Rx's for the glucose machine and strips, please advise on the second part of the mychart, thanks

## 2016-10-21 NOTE — Telephone Encounter (Signed)
Please call Dr Sedalia Muta office and see what they need for clearance.  Usually there is a form to complete.  She will also need a pre op evaluation.  Please schedule (within 30 days before her surgery )

## 2016-10-22 ENCOUNTER — Encounter: Payer: Self-pay | Admitting: Internal Medicine

## 2016-10-22 NOTE — Telephone Encounter (Signed)
Spoke with patient to let her know that we have re-faxed her prescription to [facility] for her to get a new glucometer. Prescription was faxed yesterday when systems were down. [facility] filled only strips no new monitor.

## 2016-10-23 ENCOUNTER — Other Ambulatory Visit: Payer: Self-pay | Admitting: Internal Medicine

## 2016-10-23 NOTE — Telephone Encounter (Signed)
Please schedule her for a pre op appt.

## 2016-10-24 ENCOUNTER — Other Ambulatory Visit: Payer: Self-pay | Admitting: Internal Medicine

## 2016-10-27 ENCOUNTER — Other Ambulatory Visit: Payer: Self-pay | Admitting: Internal Medicine

## 2016-10-27 DIAGNOSIS — Z1231 Encounter for screening mammogram for malignant neoplasm of breast: Secondary | ICD-10-CM

## 2016-11-17 ENCOUNTER — Ambulatory Visit
Admission: RE | Admit: 2016-11-17 | Discharge: 2016-11-17 | Disposition: A | Discharge status: Home / Self Care | Payer: Medicare Other | Source: Ambulatory Visit | Attending: Internal Medicine | Admitting: Internal Medicine

## 2016-11-17 DIAGNOSIS — Z1231 Encounter for screening mammogram for malignant neoplasm of breast: Secondary | ICD-10-CM | POA: Insufficient documentation

## 2016-11-18 ENCOUNTER — Other Ambulatory Visit: Payer: Self-pay | Admitting: Internal Medicine

## 2016-11-18 LAB — HM DIABETES EYE EXAM

## 2016-11-25 ENCOUNTER — Other Ambulatory Visit (INDEPENDENT_AMBULATORY_CARE_PROVIDER_SITE_OTHER): Payer: Medicare Other

## 2016-11-25 DIAGNOSIS — E119 Type 2 diabetes mellitus without complications: Secondary | ICD-10-CM

## 2016-11-25 DIAGNOSIS — E7800 Pure hypercholesterolemia, unspecified: Secondary | ICD-10-CM | POA: Diagnosis not present

## 2016-11-25 LAB — HEPATIC FUNCTION PANEL
ALBUMIN: 4.1 g/dL (ref 3.5–5.2)
ALT: 26 U/L (ref 0–35)
AST: 27 U/L (ref 0–37)
Alkaline Phosphatase: 50 U/L (ref 39–117)
BILIRUBIN TOTAL: 1 mg/dL (ref 0.2–1.2)
Bilirubin, Direct: 0.2 mg/dL (ref 0.0–0.3)
Total Protein: 6.7 g/dL (ref 6.0–8.3)

## 2016-11-25 LAB — LIPID PANEL
CHOLESTEROL: 157 mg/dL (ref 0–200)
HDL: 56.1 mg/dL (ref >39.00)
LDL CALC: 74 mg/dL (ref 0–99)
NonHDL: 100.7
Total CHOL/HDL Ratio: 3
Triglycerides: 132 mg/dL (ref 0.0–149.0)
VLDL: 26.4 mg/dL (ref 0.0–40.0)

## 2016-11-25 LAB — BASIC METABOLIC PANEL
BUN: 19 mg/dL (ref 6–23)
CHLORIDE: 96 meq/L (ref 96–112)
CO2: 31 meq/L (ref 19–32)
Calcium: 9.8 mg/dL (ref 8.4–10.5)
Creatinine, Ser: 1.07 mg/dL (ref 0.40–1.20)
GFR: 52.74 mL/min — ABNORMAL LOW (ref >60.00)
Glucose, Bld: 111 mg/dL — ABNORMAL HIGH (ref 70–99)
Potassium: 3.8 mEq/L (ref 3.5–5.1)
Sodium: 136 mEq/L (ref 135–145)

## 2016-11-25 LAB — HEMOGLOBIN A1C: Hgb A1c MFr Bld: 6.6 % — ABNORMAL HIGH (ref 4.6–6.5)

## 2016-11-26 ENCOUNTER — Encounter: Payer: Self-pay | Admitting: Internal Medicine

## 2016-11-26 ENCOUNTER — Ambulatory Visit (INDEPENDENT_AMBULATORY_CARE_PROVIDER_SITE_OTHER): Payer: Medicare Other | Admitting: Internal Medicine

## 2016-11-26 VITALS — BP 130/72 | HR 70 | Temp 97.8°F | Resp 17 | Ht 64.96 in | Wt 212.8 lb

## 2016-11-26 DIAGNOSIS — M5441 Lumbago with sciatica, right side: Secondary | ICD-10-CM | POA: Diagnosis not present

## 2016-11-26 DIAGNOSIS — Z01818 Encounter for other preprocedural examination: Secondary | ICD-10-CM

## 2016-11-26 DIAGNOSIS — E119 Type 2 diabetes mellitus without complications: Secondary | ICD-10-CM | POA: Diagnosis not present

## 2016-11-26 DIAGNOSIS — I8390 Asymptomatic varicose veins of unspecified lower extremity: Secondary | ICD-10-CM

## 2016-11-26 DIAGNOSIS — I4510 Unspecified right bundle-branch block: Secondary | ICD-10-CM | POA: Diagnosis not present

## 2016-11-26 DIAGNOSIS — Z9109 Other allergy status, other than to drugs and biological substances: Secondary | ICD-10-CM | POA: Diagnosis not present

## 2016-11-26 DIAGNOSIS — F439 Reaction to severe stress, unspecified: Secondary | ICD-10-CM | POA: Diagnosis not present

## 2016-11-26 DIAGNOSIS — E7800 Pure hypercholesterolemia, unspecified: Secondary | ICD-10-CM | POA: Diagnosis not present

## 2016-11-26 DIAGNOSIS — I10 Essential (primary) hypertension: Secondary | ICD-10-CM | POA: Diagnosis not present

## 2016-11-26 MED ORDER — MUPIROCIN 2 % EX OINT: TOPICAL_OINTMENT | CUTANEOUS | 0 refills | Status: DC

## 2016-11-26 NOTE — Progress Notes (Signed)
Patient ID: Carrie Mills, female   DOB: [birth year], 76 y.o.   MRN: [id number]   Subjective:    Patient ID: Carrie Mills, female    DOB: 1940-05-14, 76 y.o.   MRN: [id number]  HPI  Patient here for a scheduled follow up.  She reports increased stress with caring for her husband.  He is bedridden now.  Discussed with her today.  She feels she is handling things relatively well.  Does not feel needs anything more at this point.  She tries to stay active.  No chest pain.  No sob.  No acid reflux.  No abdominal pain.  Bowels moving.  Found to have lumbar stenosis on MRI.  Planning for lumbar decompression 12/14/16.  Needs pre op clearance.  She also reports that she bumped her left ankle on her husband's wheelchair.  Persistent changes on her skin.  Was oozing.  Is better now.  Previously treated for cellulitis (08/2016).   Was on abx.  This completely cleared.     Past Medical History:  Diagnosis Date  . Allergy   . Colon polyps   . Hyperglycemia   . Hyperlipidemia   . Hypertension   . Phlebitis   . Varicose veins    Past Surgical History:  Procedure Laterality Date  . ABDOMINAL HYSTERECTOMY  1987  . APPENDECTOMY  1987  . COLECTOMY  2004  . COLONOSCOPY  2009  . COLONOSCOPY  12-27-12   Dr Jamal Collin  . REPLACEMENT TOTAL KNEE Left 06/05/13  . THROMBECTOMY    . TOTAL ABDOMINAL HYSTERECTOMY W/ BILATERAL SALPINGOOPHORECTOMY  1987   Family History  Problem Relation Age of Onset  . Arthritis Mother   . Heart disease Mother        s/p CABG  . Hypertension Mother   . Heart disease Father   . Hypertension Father   . Diabetes Father   . Breast cancer Neg Hx   . Colon cancer Neg Hx    Social History   Social History  . Marital status: Married    Spouse name: N/A  . Number of children: N/A  . Years of education: N/A   Social History Main Topics  . Smoking status: Never Smoker  . Smokeless tobacco: Never Used  . Alcohol use No  . Drug use: No  . Sexual activity: Not Currently     Other Topics Concern  . None   Social History Narrative  . None    Outpatient Encounter Prescriptions as of 11/26/2016  Medication Sig  . ACCU-CHEK AVIVA PLUS test strip TEST BLOOD SUGAR ONCE A DAY OR AS INSTRUCTED  . ACCU-CHEK SOFTCLIX LANCETS lancets APPLY AS DIRECTED 4 TIMES A DAY  . Blood Glucose Monitoring Suppl (ACCU-CHEK AVIVA PLUS) w/Device KIT USE AS DIRECTED 4 TIMES A DAY  . Calcium Carbonate Antacid 400 MG CHEW Chew by mouth daily. Reported on 05/14/2015  . citalopram (CELEXA) 10 MG tablet TAKE 1 TABLET BY MOUTH EVERY DAY  . Coconut Oil 1000 MG CAPS Take by mouth.  . fluticasone (FLONASE) 50 MCG/ACT nasal spray   . Glucosamine HCl 1000 MG TABS Take by mouth.  Marland Kitchen glucose blood (ACCU-CHEK AVIVA PLUS) test strip TEST BLOOD SUGAR ONCE A DAY OR AS INSTRUCTED. DX 250.0  . hydrochlorothiazide (HYDRODIURIL) 25 MG tablet TAKE 1 TABLET BY MOUTH EVERY DAY **06/27/2016 [facility]**  . metoprolol succinate (TOPROL-XL) 25 MG 24 hr tablet Take 1 tablet (25 mg total) by mouth 2 (two) times daily.  . mometasone (ELOCON)  0.1 % lotion Use as directed  . Montelukast Sodium (SINGULAIR PO) Take by mouth daily.  . Multiple Vitamin (MULTIVITAMIN) capsule Take 1 capsule by mouth daily.  . Omega-3 Fatty Acids (FISH OIL) 1200 MG CAPS Take by mouth daily.  Marland Kitchen penciclovir (DENAVIR) 1 % cream Apply to affected area qid  . pramoxine-hydrocortisone (ANALPRAM-HC) 1-1 % rectal cream Place 1 application rectally 2 (two) times daily.  . pregabalin (LYRICA) 75 MG capsule Take 75 mg by mouth 2 (two) times daily.  . rosuvastatin (CRESTOR) 5 MG tablet Take 1 tablet (5 mg total) by mouth daily.  . sodium chloride (OCEAN) 0.65 % nasal spray Place 1 spray into the nose as needed.  . triamcinolone (NASACORT) 55 MCG/ACT nasal inhaler Place 2 sprays into the nose daily.  . mupirocin ointment (BACTROBAN) 2 % One application to affected area bid.   No facility-administered encounter medications on file as of 11/26/2016.      Review of Systems  Constitutional: Negative for appetite change and unexpected weight change.  HENT: Negative for congestion and sinus pressure.   Respiratory: Negative for cough, chest tightness and shortness of breath.   Cardiovascular: Negative for chest pain, palpitations and leg swelling.  Gastrointestinal: Negative for abdominal pain, diarrhea, nausea and vomiting.  Genitourinary: Negative for difficulty urinating and dysuria.  Musculoskeletal: Negative for joint swelling.       Persistent pain.  MRI revealed spinal stenosis - lumbar.   Skin: Negative for color change and rash.  Neurological: Negative for dizziness, light-headedness and headaches.  Psychiatric/Behavioral: Negative for agitation and dysphoric mood.       Increased stress as outlined.         Objective:    Physical Exam  Constitutional: She appears well-developed and well-nourished. No distress.  HENT:  Nose: Nose normal.  Mouth/Throat: Oropharynx is clear and moist.  Neck: Neck supple. No thyromegaly present.  Cardiovascular: Normal rate and regular rhythm.   Pulmonary/Chest: Breath sounds normal. No respiratory distress. She has no wheezes.  Abdominal: Soft. Bowel sounds are normal. There is no tenderness.  Musculoskeletal: She exhibits no edema or tenderness.  Lymphadenopathy:    She has no cervical adenopathy.  Skin: No rash noted. No erythema.  Changes in skin - left ankle.  Some stasis changes with open area.  No significant tenderness.    Psychiatric: She has a normal mood and affect. Her behavior is normal.    BP 130/72 (BP Location: Left Arm, Patient Position: Sitting, Cuff Size: Large)   Pulse 70   Temp 97.8 F (36.6 C) (Oral)   Resp 17   Ht 5' 4.96" (1.65 m)   Wt 212 lb 12.8 oz (96.5 kg)   SpO2 95%   BMI 35.45 kg/m  Wt Readings from Last 3 Encounters:  11/26/16 212 lb 12.8 oz (96.5 kg)  05/21/16 211 lb 3.2 oz (95.8 kg)  01/23/16 209 lb (94.8 kg)     Lab Results  Component  Value Date   WBC 8.0 05/19/2016   HGB 12.7 05/19/2016   HCT 38.2 05/19/2016   PLT 272.0 05/19/2016   GLUCOSE 111 (H) 11/25/2016   CHOL 157 11/25/2016   TRIG 132.0 11/25/2016   HDL 56.10 11/25/2016   LDLDIRECT 89.0 05/10/2015   LDLCALC 74 11/25/2016   ALT 26 11/25/2016   AST 27 11/25/2016   NA 136 11/25/2016   K 3.8 11/25/2016   CL 96 11/25/2016   CREATININE 1.07 11/25/2016   BUN 19 11/25/2016   CO2 31 11/25/2016  TSH 3.18 05/19/2016   INR 0.9 05/24/2013   HGBA1C 6.6 (H) 11/25/2016   MICROALBUR <0.7 05/21/2016    Mm Digital Screening Bilateral  Result Date: 11/17/2016 CLINICAL DATA:  Screening. EXAM: DIGITAL SCREENING BILATERAL MAMMOGRAM WITH CAD COMPARISON:  Previous exam(s). ACR Breast Density Category b: There are scattered areas of fibroglandular density. FINDINGS: There are no findings suspicious for malignancy. Images were processed with CAD. IMPRESSION: No mammographic evidence of malignancy. A result letter of this screening mammogram will be mailed directly to the patient. RECOMMENDATION: Screening mammogram in one year. (Code:SM-B-01Y) BI-RADS CATEGORY  1: Negative. Electronically Signed   By: Curlene Dolphin M.D.   On: 11/17/2016 16:50       Assessment & Plan:   Problem List Items Addressed This Visit    Asymptomatic varicose veins    Sees Dr Jamal Collin.  Wears compression hose.  Has some stasis changes.  Recent injury to her left ankle.  Open area.  Was oozing.  Better now.  Still persistent lesion.  bactroban ointment as outlined.  Follow for clearance of infection.  Needs to be clear prior to her surgery.        Back pain    Saw Dr Marry Guan.  MRI - lumbar stenosis.  Planning for surgery 12/14/16.  EKG today - SR with RBBB - not appreciated on previous EKG.  She is currently asymptomatic - no chest pain.  No sob.  Will have cardiology review to confirm if any further cardiac w/up warranted prior to her surgery.        Diabetes ([place])    Discussed diet and exercise.  She  tries to watch her diet and exercise.  Follow met b and a1c.   Lab Results  Component Value Date   HGBA1C 6.6 (H) 11/25/2016        Relevant Orders   Hemoglobin Q2I   Basic metabolic panel   Environmental allergies    Followed by Dr Donneta Romberg      Hypercholesterolemia    On crestor.  Low cholesterol diet and exercise.  Follow lipid panel and liver function tests.   Lab Results  Component Value Date   CHOL 157 11/25/2016   HDL 56.10 11/25/2016   LDLCALC 74 11/25/2016   LDLDIRECT 89.0 05/10/2015   TRIG 132.0 11/25/2016   CHOLHDL 3 11/25/2016        Relevant Orders   Hepatic function panel   Lipid panel   Hypertension    Blood pressure under good control.  Continue same medication regimen.  Follow pressures.  Follow metabolic panel.        Pre-op evaluation - Primary    EKG - SR with RBBB.  RBBB not present on previous EKG.  Currently without chest pain or sob.  Will need close pre op and post op monitoring of heart rate and blood pressure to avoid extremes.   Will also have cardiology review and see if any further cardiac w/up warranted.  Will have cardiology give clearance prior to surgery.        Relevant Orders   EKG 12-Lead (Completed)   Ambulatory referral to Cardiology   Stress    Increased stress as outlined.  Discussed with her today.  Does not feel she needs anything more at this time.  Follow.        Other Visit Diagnoses    New onset right bundle branch block (RBBB)       Relevant Orders   Ambulatory referral to Cardiology  Einar Pheasant, MD

## 2016-11-28 ENCOUNTER — Encounter: Payer: Self-pay | Admitting: Internal Medicine

## 2016-11-28 DIAGNOSIS — Z01818 Encounter for other preprocedural examination: Secondary | ICD-10-CM | POA: Insufficient documentation

## 2016-11-28 NOTE — Assessment & Plan Note (Signed)
Discussed diet and exercise.  She tries to watch her diet and exercise.  Follow met b and a1c.   Lab Results  Component Value Date   HGBA1C 6.6 (H) 11/25/2016

## 2016-11-28 NOTE — Assessment & Plan Note (Signed)
Saw Dr Marry Guan.  MRI - lumbar stenosis.  Planning for surgery 12/14/16.  EKG today - SR with RBBB - not appreciated on previous EKG.  She is currently asymptomatic - no chest pain.  No sob.  Will have cardiology review to confirm if any further cardiac w/up warranted prior to her surgery.

## 2016-11-28 NOTE — Assessment & Plan Note (Signed)
On crestor.  Low cholesterol diet and exercise.  Follow lipid panel and liver function tests.   Lab Results  Component Value Date   CHOL 157 11/25/2016   HDL 56.10 11/25/2016   LDLCALC 74 11/25/2016   LDLDIRECT 89.0 05/10/2015   TRIG 132.0 11/25/2016   CHOLHDL 3 11/25/2016

## 2016-11-28 NOTE — Assessment & Plan Note (Signed)
Blood pressure under good control.  Continue same medication regimen.  Follow pressures.  Follow metabolic panel.   

## 2016-11-28 NOTE — Telephone Encounter (Signed)
Please send pt a copy of her labs per my chart request.   Thanks

## 2016-11-28 NOTE — Assessment & Plan Note (Signed)
Sees Dr Jamal Collin.  Wears compression hose.  Has some stasis changes.  Recent injury to her left ankle.  Open area.  Was oozing.  Better now.  Still persistent lesion.  bactroban ointment as outlined.  Follow for clearance of infection.  Needs to be clear prior to her surgery.

## 2016-11-28 NOTE — Assessment & Plan Note (Signed)
Increased stress as outlined.  Discussed with her today.  Does not feel she needs anything more at this time.  Follow.

## 2016-11-28 NOTE — Assessment & Plan Note (Signed)
Followed by Dr Sharma.  

## 2016-11-28 NOTE — Assessment & Plan Note (Addendum)
EKG - SR with RBBB.  RBBB not present on previous EKG.  Currently without chest pain or sob.  Will need close pre op and post op monitoring of heart rate and blood pressure to avoid extremes.   Will also have cardiology review and see if any further cardiac w/up warranted.  Will have cardiology give clearance prior to surgery.

## 2016-12-01 ENCOUNTER — Ambulatory Visit (INDEPENDENT_AMBULATORY_CARE_PROVIDER_SITE_OTHER): Payer: Medicare Other | Admitting: General Surgery

## 2016-12-01 ENCOUNTER — Encounter: Payer: Self-pay | Admitting: General Surgery

## 2016-12-01 VITALS — BP 130/78 | HR 64 | Resp 13 | Ht 64.5 in | Wt 214.0 lb

## 2016-12-01 DIAGNOSIS — I872 Venous insufficiency (chronic) (peripheral): Secondary | ICD-10-CM

## 2016-12-01 NOTE — Progress Notes (Signed)
Patient ID: Carrie Mills, female   DOB: 1941/02/02, 76 y.o.   MRN: [id number]  Chief Complaint  Patient presents with  . Varicose Veins    HPI Carrie Mills is a 76 y.o. female.   who presents for a follow up of varicose veins. She denies any problems with her legs at this time. She is using her compression hose on a daily basis. She states she had left leg cellulitis in June, treated with Cephlaxin, but has gotten better. Patient states she is planning to have back surgery December 14, 2016 for spinal stenosis.    HPI  Past Medical History:  Diagnosis Date  . Allergy   . Colon polyps   . Hyperglycemia   . Hyperlipidemia   . Hypertension   . Phlebitis   . Varicose veins     Past Surgical History:  Procedure Laterality Date  . ABDOMINAL HYSTERECTOMY  1987  . APPENDECTOMY  1987  . COLECTOMY  2004  . COLONOSCOPY  2009  . COLONOSCOPY  12-27-12   Dr Jamal Collin  . REPLACEMENT TOTAL KNEE Left 06/05/13  . THROMBECTOMY    . TOTAL ABDOMINAL HYSTERECTOMY W/ BILATERAL SALPINGOOPHORECTOMY  1987    Family History  Problem Relation Age of Onset  . Arthritis Mother   . Heart disease Mother        s/p CABG  . Hypertension Mother   . Heart disease Father   . Hypertension Father   . Diabetes Father   . Breast cancer Neg Hx   . Colon cancer Neg Hx     Social History Social History  Substance Use Topics  . Smoking status: Never Smoker  . Smokeless tobacco: Never Used  . Alcohol use No    Allergies  Allergen Reactions  . Sulfa Antibiotics Hives and Other (See Comments)    Fainting    Current Outpatient Prescriptions  Medication Sig Dispense Refill  . ACCU-CHEK AVIVA PLUS test strip TEST BLOOD SUGAR ONCE A DAY OR AS INSTRUCTED 100 each 3  . ACCU-CHEK SOFTCLIX LANCETS lancets APPLY AS DIRECTED 4 TIMES A DAY 100 each 0  . Blood Glucose Monitoring Suppl (ACCU-CHEK AVIVA PLUS) w/Device KIT USE AS DIRECTED 4 TIMES A DAY 1 kit 0  . citalopram (CELEXA) 10 MG tablet TAKE 1  TABLET BY MOUTH EVERY DAY 90 tablet 0  . glucose blood (ACCU-CHEK AVIVA PLUS) test strip TEST BLOOD SUGAR ONCE A DAY OR AS INSTRUCTED. DX 250.0 100 each 6  . hydrochlorothiazide (HYDRODIURIL) 25 MG tablet TAKE 1 TABLET BY MOUTH EVERY DAY **06/27/2016 INS** 90 tablet 0  . metoprolol succinate (TOPROL-XL) 25 MG 24 hr tablet Take 1 tablet (25 mg total) by mouth 2 (two) times daily. 180 tablet 0  . montelukast (SINGULAIR) 10 MG tablet Take 1 tablet by mouth at bedtime.     . Multiple Vitamin (MULTIVITAMIN) capsule Take 1 capsule by mouth daily.    . pregabalin (LYRICA) 75 MG capsule Take 75 mg by mouth 2 (two) times daily.    . rosuvastatin (CRESTOR) 5 MG tablet Take 1 tablet (5 mg total) by mouth daily. 90 tablet 1  . sodium chloride (OCEAN) 0.65 % nasal spray Place 1 spray into the nose daily as needed for congestion.     . triamcinolone (NASACORT) 55 MCG/ACT nasal inhaler Place 2 sprays into the nose daily.    . fluticasone furoate-vilanterol (BREO ELLIPTA) 200-25 MCG/INH AEPB Inhale 1 puff into the lungs daily.     No current facility-administered  medications for this visit.     Review of Systems Review of Systems  Constitutional: Negative.   Respiratory: Negative.   Cardiovascular: Negative.     Blood pressure 130/78, pulse 64, resp. rate 13, height 5' 4.5" (1.638 m), weight 214 lb (97.1 kg).  Physical Exam Physical Exam  Constitutional: She is oriented to person, place, and time. She appears well-developed and well-nourished.  Cardiovascular: Intact distal pulses.   Mild edema lower extremity. Scattered varicose veins. Skin pigmentation in area of healed ulcer left medial ankle  Neurological: She is alert and oriented to person, place, and time.  Skin: Skin is warm and dry.  Psychiatric: Her behavior is normal.    Data Reviewed  Progress notes and colonoscopy procedure reviewed.  Assessment    Chronic venous stasis - no new ulcers. Prior left medial malleolus ulcer is well  healed.     Plan   Recommended zinc oxide or udder cream to apply over ankle ulcer for improved healing. Continue compression hose.  Colonoscopy due in 1 year, can schedule with Dr. Bary Castilla.   Can return to clinic if needed for leg concerns.      HPI, Physical Exam, Assessment and Plan have been scribed under the direction and in the presence of Mckinley Jewel, MD Karie Fetch, RN I have completed the exam and reviewed the above documentation for accuracy and completeness.  I agree with the above.  Haematologist has been used and any errors in dictation or transcription are unintentional.  Seeplaputhur G. Jamal Collin, M.D., F.A.C.S.   Junie Panning G 12/02/2016, 10:45 AM

## 2016-12-01 NOTE — Telephone Encounter (Signed)
Mailed hard copy

## 2016-12-01 NOTE — Patient Instructions (Addendum)
The patient is aware to call back for any questions or concerns. Recommended zinc oxide or udder cream to apply over ankle ulcer for improved healing. Continue using compression hose. Patient can follow up with Dr. Delana Meyer in the future if she has concerns for her legs. Due for colonoscopy in 1 year with Dr. Bary Castilla

## 2016-12-02 ENCOUNTER — Ambulatory Visit
Admission: RE | Admit: 2016-12-02 | Discharge: 2016-12-02 | Disposition: A | Discharge status: Home / Self Care | Payer: Medicare Other | Source: Ambulatory Visit | Attending: Neurological Surgery | Admitting: Neurological Surgery

## 2016-12-02 ENCOUNTER — Encounter
Admission: RE | Admit: 2016-12-02 | Discharge: 2016-12-02 | Disposition: A | Discharge status: Home / Self Care | Payer: Medicare Other | Source: Ambulatory Visit | Attending: Neurological Surgery | Admitting: Neurological Surgery

## 2016-12-02 DIAGNOSIS — J9811 Atelectasis: Secondary | ICD-10-CM | POA: Diagnosis not present

## 2016-12-02 DIAGNOSIS — Z01818 Encounter for other preprocedural examination: Secondary | ICD-10-CM | POA: Diagnosis present

## 2016-12-02 DIAGNOSIS — I10 Essential (primary) hypertension: Secondary | ICD-10-CM | POA: Diagnosis not present

## 2016-12-02 DIAGNOSIS — Z01812 Encounter for preprocedural laboratory examination: Secondary | ICD-10-CM | POA: Insufficient documentation

## 2016-12-02 DIAGNOSIS — M48061 Spinal stenosis, lumbar region without neurogenic claudication: Secondary | ICD-10-CM | POA: Insufficient documentation

## 2016-12-02 HISTORY — DX: Other intervertebral disc degeneration, lumbar region without mention of lumbar back pain or lower extremity pain: M51.369

## 2016-12-02 HISTORY — DX: Anxiety disorder, unspecified: F41.9

## 2016-12-02 HISTORY — DX: Other intervertebral disc degeneration, lumbar region: M51.36

## 2016-12-02 HISTORY — DX: Malignant (primary) neoplasm, unspecified: C80.1

## 2016-12-02 LAB — BASIC METABOLIC PANEL
ANION GAP: 9 (ref 5–15)
BUN: 17 mg/dL (ref 6–20)
CALCIUM: 9.6 mg/dL (ref 8.9–10.3)
CHLORIDE: 95 mmol/L — AB (ref 101–111)
CO2: 32 mmol/L (ref 22–32)
Creatinine, Ser: 0.94 mg/dL (ref 0.44–1.00)
GFR calc Af Amer: >60 mL/min (ref >60)
GFR calc non Af Amer: 57 mL/min — ABNORMAL LOW (ref >60)
GLUCOSE: 98 mg/dL (ref 65–99)
Potassium: 3 mmol/L — ABNORMAL LOW (ref 3.5–5.1)
Sodium: 136 mmol/L (ref 135–145)

## 2016-12-02 LAB — DIFFERENTIAL
BASOS ABS: 0.1 10*3/uL (ref 0–0.1)
BASOS PCT: 1 %
Eosinophils Absolute: 0.4 10*3/uL (ref 0–0.7)
Eosinophils Relative: 4 %
Lymphocytes Relative: 25 %
Lymphs Abs: 2.2 10*3/uL (ref 1.0–3.6)
Monocytes Absolute: 0.9 10*3/uL (ref 0.2–0.9)
Monocytes Relative: 10 %
NEUTROS ABS: 5.4 10*3/uL (ref 1.4–6.5)
NEUTROS PCT: 60 %

## 2016-12-02 LAB — SURGICAL PCR SCREEN
MRSA, PCR: NEGATIVE
STAPHYLOCOCCUS AUREUS: NEGATIVE

## 2016-12-02 LAB — CBC
HCT: 39.6 % (ref 35.0–47.0)
Hemoglobin: 13.6 g/dL (ref 12.0–16.0)
MCH: 30.3 pg (ref 26.0–34.0)
MCHC: 34.3 g/dL (ref 32.0–36.0)
MCV: 88.5 fL (ref 80.0–100.0)
PLATELETS: 248 10*3/uL (ref 150–440)
RBC: 4.48 MIL/uL (ref 3.80–5.20)
RDW: 13.4 % (ref 11.5–14.5)
WBC: 8.9 10*3/uL (ref 3.6–11.0)

## 2016-12-02 LAB — APTT: APTT: 29 s (ref 24–36)

## 2016-12-02 LAB — PROTIME-INR
INR: 0.9
Prothrombin Time: 12.1 seconds (ref 11.4–15.2)

## 2016-12-02 NOTE — Patient Instructions (Signed)
Your procedure is scheduled on: December 14, 2016 [facility] ) Report to Same Day Surgery 2nd floor medical mall ([place] Entrance-take elevator on left to 2nd floor.  Check in with surgery information desk.) To find out your arrival time please call [phone] between 1PM - 3PM on December 11, 2016 (FRIDAY )   Remember: Instructions that are not followed completely may result in serious medical risk, up to and including death, or upon the discretion of your surgeon and anesthesiologist your surgery may need to be rescheduled.    _x___ 1. Do not eat food after midnight the night before your procedure. You may drink clear liquids up to 2 hours before you are scheduled to arrive at the hospital for your procedure.  Do not drink clear liquids within 2 hours of your scheduled arrival to the hospital.  Clear liquids include  --Water or Apple juice without pulp  --Clear carbohydrate beverage such as ClearFast or Gatorade  --Black Coffee or Clear Tea (No milk, no creamers, do not add anything to                  the coffee or Tea Type 1 and type 2 diabetics should only drink water.  No gum chewing or hard candies.     __x__ 2. No Alcohol for 24 hours before or after surgery.   __x__3. No Smoking for 24 prior to surgery.   ____  4. Bring all medications with you on the day of surgery if instructed.    __x__ 5. Notify your doctor if there is any change in your medical condition     (cold, fever, infections).     Do not wear jewelry, make-up, hairpins, clips or nail polish.  Do not wear lotions, powders, or perfumes.  Do not shave 48 hours prior to surgery. Men may shave face and neck.  Do not bring valuables to the hospital.    [facility] is not responsible for any belongings or valuables.               Contacts, dentures or bridgework may not be worn into surgery.  Leave your suitcase in the car. After surgery it may be brought to your room.  For patients admitted to the hospital,  discharge time is determined by your  treatment team                  Patients discharged the day of surgery will not be allowed to drive home.  You will need someone to drive you home and stay with you the night of your procedure.    Please read over the following fact sheets that you were given:   [facility] Preparing for Surgery and or MRSA Information   TAKE THE FOLLOWING MEDICATIONS WITH A SIP OF WATER THE MORNING OF SURGERY :  1. METOPROLOL  2. SINGULAIR  3. LYRICA  4.  5.  6.  ____Fleets enema or Magnesium Citrate as directed.   _x___ Use CHG Soap or sage wipes as directed on instruction sheet   _x___ Use inhalers on the day of surgery and bring to hospital day of surgery (USE BREO [place] )  ____ Stop Metformin and Janumet 2 days prior to surgery.    ____ Take 1/2 of usual insulin dose the night before surgery and none on the morning surgery.      _x___ Follow recommendations from Cardiologist, Pulmonologist or PCP regarding  stopping Aspirin, Coumadin, Plavix ,Eliquis, Effient, or Pradaxa, and Pletal.  X____Stop Anti-inflammatories such as Advil, Aleve, Ibuprofen, Motrin, Naproxen, Naprosyn, Goodies powders or aspirin products. OK TO TAKE TYLENOL  _x___ Stop supplements until after surgery.  But may continue Vitamin D, Vitamin B, and multivitamin       ____ Bring C-Pap to the hospital.

## 2016-12-03 ENCOUNTER — Telehealth: Payer: Self-pay

## 2016-12-03 ENCOUNTER — Telehealth: Payer: Self-pay | Admitting: Internal Medicine

## 2016-12-03 ENCOUNTER — Encounter
Admission: RE | Admit: 2016-12-03 | Discharge: 2016-12-03 | Disposition: A | Discharge status: Home / Self Care | Payer: Medicare Other | Source: Ambulatory Visit | Attending: Neurological Surgery | Admitting: Neurological Surgery

## 2016-12-03 DIAGNOSIS — E876 Hypokalemia: Secondary | ICD-10-CM | POA: Insufficient documentation

## 2016-12-03 LAB — URINALYSIS, COMPLETE (UACMP) WITH MICROSCOPIC
BACTERIA UA: NONE SEEN
BILIRUBIN URINE: NEGATIVE
Glucose, UA: NEGATIVE mg/dL
HGB URINE DIPSTICK: NEGATIVE
Ketones, ur: NEGATIVE mg/dL
NITRITE: NEGATIVE
Protein, ur: NEGATIVE mg/dL
Specific Gravity, Urine: 1.008 (ref 1.005–1.030)
pH: 7 (ref 5.0–8.0)

## 2016-12-03 MED ORDER — POTASSIUM CHLORIDE ER 10 MEQ PO TBCR: 10.0000 meq | EXTENDED_RELEASE_TABLET | Freq: Every day | ORAL | 0 refills | Status: DC

## 2016-12-03 NOTE — Telephone Encounter (Signed)
Faxed to office

## 2016-12-03 NOTE — Telephone Encounter (Signed)
Betsy from [facility] called. They need pt's last EKG faxed over. Please advise, thank you!  Fax [phone]

## 2016-12-03 NOTE — Telephone Encounter (Signed)
Called both numbers for patient. [place].  Faxed form back to Dr. Aris Lot office.

## 2016-12-03 NOTE — Telephone Encounter (Signed)
Please call and notify pt that I received notice that potassium is low.  I have sent in rx for potassium 38meq q day.  Needs to have potassium rechecked in one week.  Please schedule non fasting lab appt.

## 2016-12-03 NOTE — Telephone Encounter (Signed)
Faxed received from Dr. Aris Lot. Potassium was 3.0 want to know if you can give a supplement and recheck before surgery.  Fax in your results folder.

## 2016-12-03 NOTE — Pre-Procedure Instructions (Signed)
CXR report called and faxed to Dr. Aris Lot office (spoke to [place] ),also CXR report given to [facility] (anesthesia nurse) for review .

## 2016-12-04 NOTE — Telephone Encounter (Signed)
Called patient lab app made she will start meds today.

## 2016-12-09 NOTE — Telephone Encounter (Signed)
Error

## 2016-12-11 ENCOUNTER — Other Ambulatory Visit (INDEPENDENT_AMBULATORY_CARE_PROVIDER_SITE_OTHER): Payer: Medicare Other

## 2016-12-11 DIAGNOSIS — E876 Hypokalemia: Secondary | ICD-10-CM

## 2016-12-11 LAB — POTASSIUM: POTASSIUM: 4.1 meq/L (ref 3.5–5.1)

## 2016-12-13 ENCOUNTER — Other Ambulatory Visit: Payer: Self-pay | Admitting: Internal Medicine

## 2016-12-13 DIAGNOSIS — E876 Hypokalemia: Secondary | ICD-10-CM

## 2016-12-13 NOTE — Progress Notes (Signed)
Order placed for f/u potassium check.  

## 2016-12-14 ENCOUNTER — Ambulatory Visit: Payer: Medicare Other

## 2016-12-14 ENCOUNTER — Ambulatory Visit: Payer: Medicare Other | Admitting: Certified Registered Nurse Anesthetist

## 2016-12-14 ENCOUNTER — Encounter
Admission: RE | Disposition: A | Discharge status: Home / Self Care | Payer: Self-pay | Source: Ambulatory Visit | Attending: Neurological Surgery

## 2016-12-14 ENCOUNTER — Observation Stay
Admission: RE | Admit: 2016-12-14 | Discharge: 2016-12-16 | Disposition: A | Discharge status: Home / Self Care | Payer: Medicare Other | Source: Ambulatory Visit | Attending: Neurological Surgery | Admitting: Neurological Surgery

## 2016-12-14 ENCOUNTER — Encounter: Payer: Self-pay | Admitting: *Deleted

## 2016-12-14 DIAGNOSIS — Z7984 Long term (current) use of oral hypoglycemic drugs: Secondary | ICD-10-CM | POA: Insufficient documentation

## 2016-12-14 DIAGNOSIS — Z86718 Personal history of other venous thrombosis and embolism: Secondary | ICD-10-CM | POA: Insufficient documentation

## 2016-12-14 DIAGNOSIS — M5416 Radiculopathy, lumbar region: Secondary | ICD-10-CM | POA: Insufficient documentation

## 2016-12-14 DIAGNOSIS — I10 Essential (primary) hypertension: Secondary | ICD-10-CM | POA: Diagnosis not present

## 2016-12-14 DIAGNOSIS — J45909 Unspecified asthma, uncomplicated: Secondary | ICD-10-CM | POA: Diagnosis not present

## 2016-12-14 DIAGNOSIS — M48062 Spinal stenosis, lumbar region with neurogenic claudication: Secondary | ICD-10-CM | POA: Diagnosis present

## 2016-12-14 DIAGNOSIS — Z23 Encounter for immunization: Secondary | ICD-10-CM | POA: Insufficient documentation

## 2016-12-14 DIAGNOSIS — E785 Hyperlipidemia, unspecified: Secondary | ICD-10-CM | POA: Insufficient documentation

## 2016-12-14 DIAGNOSIS — Z85828 Personal history of other malignant neoplasm of skin: Secondary | ICD-10-CM | POA: Insufficient documentation

## 2016-12-14 DIAGNOSIS — Z79899 Other long term (current) drug therapy: Secondary | ICD-10-CM | POA: Insufficient documentation

## 2016-12-14 DIAGNOSIS — Z419 Encounter for procedure for purposes other than remedying health state, unspecified: Secondary | ICD-10-CM

## 2016-12-14 DIAGNOSIS — E119 Type 2 diabetes mellitus without complications: Secondary | ICD-10-CM | POA: Insufficient documentation

## 2016-12-14 DIAGNOSIS — Z880 Allergy status to penicillin: Secondary | ICD-10-CM | POA: Insufficient documentation

## 2016-12-14 HISTORY — PX: LUMBAR LAMINECTOMY/DECOMPRESSION MICRODISCECTOMY: SHX5026

## 2016-12-14 LAB — POCT I-STAT 4, (NA,K, GLUC, HGB,HCT)
GLUCOSE: 143 mg/dL — AB (ref 65–99)
HCT: 42 % (ref 36.0–46.0)
HEMOGLOBIN: 14.3 g/dL (ref 12.0–15.0)
POTASSIUM: 3.2 mmol/L — AB (ref 3.5–5.1)
Sodium: 140 mmol/L (ref 135–145)

## 2016-12-14 LAB — GLUCOSE, CAPILLARY
GLUCOSE-CAPILLARY: 210 mg/dL — AB (ref 65–99)
GLUCOSE-CAPILLARY: 215 mg/dL — AB (ref 65–99)
Glucose-Capillary: 176 mg/dL — ABNORMAL HIGH (ref 65–99)

## 2016-12-14 LAB — ABO/RH: ABO/RH(D): A POS

## 2016-12-14 LAB — TYPE AND SCREEN
ABO/RH(D): A POS
Antibody Screen: NEGATIVE

## 2016-12-14 SURGERY — LUMBAR LAMINECTOMY/DECOMPRESSION MICRODISCECTOMY 2 LEVELS
Anesthesia: General | Site: Back

## 2016-12-14 MED ORDER — INSULIN ASPART 100 UNIT/ML ~~LOC~~ SOLN
0.0000 [IU] | Freq: Three times a day (TID) | SUBCUTANEOUS | Status: DC
  Administered 2016-12-14 (×2): 5 [IU] via SUBCUTANEOUS
  Administered 2016-12-15 (×3): 2 [IU] via SUBCUTANEOUS
  Administered 2016-12-16: 3 [IU] via SUBCUTANEOUS
  Filled 2016-12-14 (×6): qty 1

## 2016-12-14 MED ORDER — OXYCODONE HCL 5 MG PO TABS: 5.0000 mg | ORAL_TABLET | ORAL | Status: DC | PRN

## 2016-12-14 MED ORDER — TRAMADOL HCL 50 MG PO TABS
50.0000 mg | ORAL_TABLET | Freq: Four times a day (QID) | ORAL | Status: DC | PRN
  Administered 2016-12-14 – 2016-12-16 (×6): 50 mg via ORAL
  Filled 2016-12-14 (×6): qty 1

## 2016-12-14 MED ORDER — FLUTICASONE PROPIONATE 50 MCG/ACT NA SUSP
1.0000 | Freq: Every day | NASAL | Status: DC
  Administered 2016-12-15 – 2016-12-16 (×2): 1 via NASAL
  Filled 2016-12-14: qty 16

## 2016-12-14 MED ORDER — ONDANSETRON HCL 4 MG/2ML IJ SOLN
INTRAMUSCULAR | Status: AC
  Filled 2016-12-14: qty 2

## 2016-12-14 MED ORDER — ONDANSETRON HCL 4 MG/2ML IJ SOLN
INTRAMUSCULAR | Status: DC | PRN
  Administered 2016-12-14: 4 mg via INTRAVENOUS

## 2016-12-14 MED ORDER — ONDANSETRON HCL 4 MG/2ML IJ SOLN: 4.0000 mg | Freq: Four times a day (QID) | INTRAMUSCULAR | Status: DC | PRN

## 2016-12-14 MED ORDER — BACITRACIN 50000 UNITS IM SOLR
INTRAMUSCULAR | Status: AC
  Filled 2016-12-14: qty 1

## 2016-12-14 MED ORDER — THROMBIN 5000 UNITS EX SOLR
CUTANEOUS | Status: AC
  Filled 2016-12-14: qty 5000

## 2016-12-14 MED ORDER — HYDROMORPHONE HCL 1 MG/ML IJ SOLN: 0.5000 mg | INTRAMUSCULAR | Status: DC | PRN

## 2016-12-14 MED ORDER — FENTANYL CITRATE (PF) 100 MCG/2ML IJ SOLN
25.0000 ug | INTRAMUSCULAR | Status: DC | PRN
  Administered 2016-12-14: 25 ug via INTRAVENOUS

## 2016-12-14 MED ORDER — HYDROCHLOROTHIAZIDE 25 MG PO TABS
25.0000 mg | ORAL_TABLET | Freq: Every day | ORAL | Status: DC
  Administered 2016-12-15 – 2016-12-16 (×2): 25 mg via ORAL
  Filled 2016-12-14 (×2): qty 1

## 2016-12-14 MED ORDER — INFLUENZA VAC SPLIT HIGH-DOSE 0.5 ML IM SUSY
0.5000 mL | PREFILLED_SYRINGE | INTRAMUSCULAR | Status: AC
  Administered 2016-12-15: 0.5 mL via INTRAMUSCULAR
  Filled 2016-12-14: qty 0.5

## 2016-12-14 MED ORDER — MIDAZOLAM HCL 2 MG/2ML IJ SOLN
INTRAMUSCULAR | Status: DC | PRN
  Administered 2016-12-14: 1 mg via INTRAVENOUS

## 2016-12-14 MED ORDER — GLYCOPYRROLATE 0.2 MG/ML IJ SOLN
INTRAMUSCULAR | Status: DC | PRN
  Administered 2016-12-14: 0.2 mg via INTRAVENOUS

## 2016-12-14 MED ORDER — FENTANYL CITRATE (PF) 100 MCG/2ML IJ SOLN
INTRAMUSCULAR | Status: DC | PRN
  Administered 2016-12-14 (×2): 50 ug via INTRAVENOUS

## 2016-12-14 MED ORDER — SUCCINYLCHOLINE CHLORIDE 20 MG/ML IJ SOLN
INTRAMUSCULAR | Status: AC
  Filled 2016-12-14: qty 1

## 2016-12-14 MED ORDER — DEXTROSE 5 % IV SOLN
500.0000 mg | Freq: Four times a day (QID) | INTRAVENOUS | Status: DC | PRN
  Filled 2016-12-14: qty 5

## 2016-12-14 MED ORDER — FENTANYL CITRATE (PF) 250 MCG/5ML IJ SOLN
INTRAMUSCULAR | Status: AC
  Filled 2016-12-14: qty 5

## 2016-12-14 MED ORDER — METOPROLOL SUCCINATE ER 25 MG PO TB24
25.0000 mg | ORAL_TABLET | Freq: Two times a day (BID) | ORAL | Status: DC
  Administered 2016-12-15 – 2016-12-16 (×3): 25 mg via ORAL
  Filled 2016-12-14 (×3): qty 1

## 2016-12-14 MED ORDER — GLYCOPYRROLATE 0.2 MG/ML IJ SOLN
INTRAMUSCULAR | Status: AC
  Filled 2016-12-14: qty 1

## 2016-12-14 MED ORDER — FAMOTIDINE 20 MG PO TABS
ORAL_TABLET | ORAL | Status: AC
  Administered 2016-12-14: 20 mg via ORAL
  Filled 2016-12-14: qty 1

## 2016-12-14 MED ORDER — ACETAMINOPHEN 10 MG/ML IV SOLN
INTRAVENOUS | Status: DC | PRN
  Administered 2016-12-14: 1000 mg via INTRAVENOUS

## 2016-12-14 MED ORDER — GELATIN ABSORBABLE 12-7 MM EX MISC
CUTANEOUS | Status: AC
  Filled 2016-12-14: qty 1

## 2016-12-14 MED ORDER — ACETAMINOPHEN 10 MG/ML IV SOLN
INTRAVENOUS | Status: AC
Start: 2016-12-14 — End: ?
  Filled 2016-12-14: qty 100

## 2016-12-14 MED ORDER — PREGABALIN 75 MG PO CAPS
75.0000 mg | ORAL_CAPSULE | Freq: Two times a day (BID) | ORAL | Status: DC
  Administered 2016-12-14 – 2016-12-16 (×4): 75 mg via ORAL
  Filled 2016-12-14 (×4): qty 1

## 2016-12-14 MED ORDER — POTASSIUM CHLORIDE ER 10 MEQ PO TBCR
10.0000 meq | EXTENDED_RELEASE_TABLET | Freq: Every day | ORAL | Status: DC
  Administered 2016-12-14 – 2016-12-16 (×3): 10 meq via ORAL
  Filled 2016-12-14 (×5): qty 1

## 2016-12-14 MED ORDER — ROCURONIUM BROMIDE 100 MG/10ML IV SOLN
INTRAVENOUS | Status: DC | PRN
  Administered 2016-12-14: 50 mg via INTRAVENOUS
  Administered 2016-12-14: 10 mg via INTRAVENOUS

## 2016-12-14 MED ORDER — DEXAMETHASONE SODIUM PHOSPHATE 10 MG/ML IJ SOLN
INTRAMUSCULAR | Status: DC | PRN
  Administered 2016-12-14: 6 mg via INTRAVENOUS

## 2016-12-14 MED ORDER — FAMOTIDINE 20 MG PO TABS
20.0000 mg | ORAL_TABLET | Freq: Once | ORAL | Status: AC
  Administered 2016-12-14: 20 mg via ORAL

## 2016-12-14 MED ORDER — SODIUM CHLORIDE 0.9 % IV SOLN: 250.0000 mL | INTRAVENOUS | Status: DC

## 2016-12-14 MED ORDER — ROSUVASTATIN CALCIUM 10 MG PO TABS
5.0000 mg | ORAL_TABLET | Freq: Every day | ORAL | Status: DC
  Administered 2016-12-14 – 2016-12-16 (×3): 5 mg via ORAL
  Filled 2016-12-14 (×3): qty 1

## 2016-12-14 MED ORDER — FLUTICASONE FUROATE-VILANTEROL 200-25 MCG/INH IN AEPB
1.0000 | INHALATION_SPRAY | Freq: Every day | RESPIRATORY_TRACT | Status: DC
  Administered 2016-12-15 – 2016-12-16 (×2): 1 via RESPIRATORY_TRACT
  Filled 2016-12-14: qty 28

## 2016-12-14 MED ORDER — SUGAMMADEX SODIUM 200 MG/2ML IV SOLN
INTRAVENOUS | Status: DC | PRN
  Administered 2016-12-14: 200 mg via INTRAVENOUS

## 2016-12-14 MED ORDER — BUPIVACAINE-EPINEPHRINE (PF) 0.25% -1:200000 IJ SOLN
INTRAMUSCULAR | Status: AC
  Filled 2016-12-14: qty 30

## 2016-12-14 MED ORDER — POLYVINYL ALCOHOL 1.4 % OP SOLN
1.0000 [drp] | Freq: Four times a day (QID) | OPHTHALMIC | Status: DC | PRN
  Administered 2016-12-14: 1 [drp] via OPHTHALMIC
  Filled 2016-12-14 (×2): qty 15

## 2016-12-14 MED ORDER — SODIUM CHLORIDE 0.9% FLUSH: 3.0000 mL | INTRAVENOUS | Status: DC | PRN

## 2016-12-14 MED ORDER — EPHEDRINE SULFATE 50 MG/ML IJ SOLN
INTRAMUSCULAR | Status: DC | PRN
  Administered 2016-12-14 (×2): 10 mg via INTRAVENOUS

## 2016-12-14 MED ORDER — SODIUM CHLORIDE 0.9 % IV SOLN
INTRAVENOUS | Status: DC | PRN
  Administered 2016-12-14: 100 ug/min via INTRAVENOUS

## 2016-12-14 MED ORDER — DEXAMETHASONE SODIUM PHOSPHATE 10 MG/ML IJ SOLN
INTRAMUSCULAR | Status: AC
  Filled 2016-12-14: qty 1

## 2016-12-14 MED ORDER — PROPOFOL 10 MG/ML IV BOLUS
INTRAVENOUS | Status: DC | PRN
Start: 2016-12-14 — End: 2016-12-14
  Administered 2016-12-14: 140 mg via INTRAVENOUS

## 2016-12-14 MED ORDER — LIDOCAINE HCL (CARDIAC) 20 MG/ML IV SOLN
INTRAVENOUS | Status: DC | PRN
  Administered 2016-12-14: 40 mg via INTRAVENOUS

## 2016-12-14 MED ORDER — VANCOMYCIN HCL IN DEXTROSE 1-5 GM/200ML-% IV SOLN
1000.0000 mg | Freq: Once | INTRAVENOUS | Status: AC
  Administered 2016-12-14: 1000 mg via INTRAVENOUS

## 2016-12-14 MED ORDER — EPHEDRINE SULFATE 50 MG/ML IJ SOLN
INTRAMUSCULAR | Status: AC
  Filled 2016-12-14: qty 1

## 2016-12-14 MED ORDER — ACETAMINOPHEN 650 MG RE SUPP: 650.0000 mg | RECTAL | Status: DC | PRN

## 2016-12-14 MED ORDER — PROPOFOL 10 MG/ML IV BOLUS
INTRAVENOUS | Status: AC
  Filled 2016-12-14: qty 20

## 2016-12-14 MED ORDER — SENNOSIDES-DOCUSATE SODIUM 8.6-50 MG PO TABS: 1.0000 | ORAL_TABLET | Freq: Every evening | ORAL | Status: DC | PRN

## 2016-12-14 MED ORDER — ACETAMINOPHEN 325 MG PO TABS
650.0000 mg | ORAL_TABLET | ORAL | Status: DC | PRN
  Administered 2016-12-16: 650 mg via ORAL
  Filled 2016-12-14: qty 2

## 2016-12-14 MED ORDER — SODIUM CHLORIDE 0.9% FLUSH: 3.0000 mL | Freq: Two times a day (BID) | INTRAVENOUS | Status: DC

## 2016-12-14 MED ORDER — CITALOPRAM HYDROBROMIDE 20 MG PO TABS
10.0000 mg | ORAL_TABLET | Freq: Every day | ORAL | Status: DC
  Administered 2016-12-15 – 2016-12-16 (×2): 10 mg via ORAL
  Filled 2016-12-14 (×2): qty 1

## 2016-12-14 MED ORDER — PHENYLEPHRINE HCL 10 MG/ML IJ SOLN
INTRAMUSCULAR | Status: AC
  Filled 2016-12-14: qty 1

## 2016-12-14 MED ORDER — SODIUM CHLORIDE 0.9 % IV SOLN
INTRAVENOUS | Status: DC
  Administered 2016-12-14 (×2): via INTRAVENOUS

## 2016-12-14 MED ORDER — MONTELUKAST SODIUM 10 MG PO TABS
10.0000 mg | ORAL_TABLET | Freq: Every day | ORAL | Status: DC
  Administered 2016-12-15 – 2016-12-16 (×2): 10 mg via ORAL
  Filled 2016-12-14 (×2): qty 1

## 2016-12-14 MED ORDER — MIDAZOLAM HCL 2 MG/2ML IJ SOLN
INTRAMUSCULAR | Status: AC
  Filled 2016-12-14: qty 2

## 2016-12-14 MED ORDER — BUPIVACAINE-EPINEPHRINE (PF) 0.25% -1:200000 IJ SOLN
INTRAMUSCULAR | Status: DC | PRN
  Administered 2016-12-14: 10 mL

## 2016-12-14 MED ORDER — SODIUM CHLORIDE FLUSH 0.9 % IV SOLN
INTRAVENOUS | Status: AC
  Filled 2016-12-14: qty 10

## 2016-12-14 MED ORDER — ACETAMINOPHEN 500 MG PO TABS
1000.0000 mg | ORAL_TABLET | Freq: Four times a day (QID) | ORAL | Status: AC
  Administered 2016-12-14 (×3): 1000 mg via ORAL
  Filled 2016-12-14 (×4): qty 2

## 2016-12-14 MED ORDER — ONDANSETRON HCL 4 MG/2ML IJ SOLN: 4.0000 mg | Freq: Once | INTRAMUSCULAR | Status: DC | PRN

## 2016-12-14 MED ORDER — ROCURONIUM BROMIDE 50 MG/5ML IV SOLN
INTRAVENOUS | Status: AC
  Filled 2016-12-14: qty 1

## 2016-12-14 MED ORDER — SODIUM CHLORIDE 0.9 % IR SOLN
Status: DC | PRN
  Administered 2016-12-14: 1000 mL

## 2016-12-14 MED ORDER — FENTANYL CITRATE (PF) 100 MCG/2ML IJ SOLN
INTRAMUSCULAR | Status: AC
  Filled 2016-12-14: qty 2

## 2016-12-14 MED ORDER — METHYLPREDNISOLONE ACETATE 40 MG/ML IJ SUSP
INTRAMUSCULAR | Status: AC
  Filled 2016-12-14: qty 1

## 2016-12-14 MED ORDER — METHOCARBAMOL 500 MG PO TABS
500.0000 mg | ORAL_TABLET | Freq: Four times a day (QID) | ORAL | Status: DC | PRN
  Administered 2016-12-14 – 2016-12-16 (×5): 500 mg via ORAL
  Filled 2016-12-14 (×5): qty 1

## 2016-12-14 MED ORDER — ONDANSETRON HCL 4 MG PO TABS: 4.0000 mg | ORAL_TABLET | Freq: Four times a day (QID) | ORAL | Status: DC | PRN

## 2016-12-14 MED ORDER — THROMBIN 5000 UNITS EX SOLR
CUTANEOUS | Status: DC | PRN
  Administered 2016-12-14: 5000 [IU] via TOPICAL

## 2016-12-14 MED ORDER — SUGAMMADEX SODIUM 200 MG/2ML IV SOLN
INTRAVENOUS | Status: AC
  Filled 2016-12-14: qty 2

## 2016-12-14 MED ORDER — LACTATED RINGERS IV SOLN
INTRAVENOUS | Status: DC
  Administered 2016-12-14: 07:00:00 via INTRAVENOUS

## 2016-12-14 SURGICAL SUPPLY — 58 items
BULB RESERV EVAC DRAIN JP 100C (MISCELLANEOUS) ×3 IMPLANT
BUR NEURO DRILL SOFT 3.0X3.8M (BURR) ×3 IMPLANT
CANISTER SUCT 1200ML W/VALVE (MISCELLANEOUS) ×6 IMPLANT
CHLORAPREP W/TINT 26ML (MISCELLANEOUS) ×6 IMPLANT
CNTNR SPEC 2.5X3XGRAD LEK (MISCELLANEOUS) ×1
CONT SPEC 4OZ STER OR WHT (MISCELLANEOUS) ×2
CONTAINER SPEC 2.5X3XGRAD LEK (MISCELLANEOUS) ×1 IMPLANT
COUNTER NEEDLE 20/40 LG (NEEDLE) ×3 IMPLANT
COVER LIGHT HANDLE STERIS (MISCELLANEOUS) ×6 IMPLANT
DERMABOND ADVANCED (GAUZE/BANDAGES/DRESSINGS) ×2
DERMABOND ADVANCED .7 DNX12 (GAUZE/BANDAGES/DRESSINGS) ×1 IMPLANT
DRAIN CHANNEL JP 10F RND 20C F (MISCELLANEOUS) ×3 IMPLANT
DRAPE C-ARM 42X72 X-RAY (DRAPES) ×6 IMPLANT
DRAPE LAPAROTOMY 100X77 ABD (DRAPES) ×3 IMPLANT
DRAPE MICROSCOPE SPINE 48X150 (DRAPES) ×3 IMPLANT
DRAPE POUCH INSTRU U-SHP 10X18 (DRAPES) ×3 IMPLANT
DRAPE SURG 17X11 SM STRL (DRAPES) ×12 IMPLANT
DRSG OPSITE POSTOP 4X8 (GAUZE/BANDAGES/DRESSINGS) ×3 IMPLANT
ELECT CAUTERY BLADE TIP 2.5 (TIP) ×3
ELECT EZSTD 165MM 6.5IN (MISCELLANEOUS) ×3
ELECTRODE CAUTERY BLDE TIP 2.5 (TIP) ×1 IMPLANT
ELECTRODE EZSTD 165MM 6.5IN (MISCELLANEOUS) ×1 IMPLANT
FEE INTRAOP MONITOR IMPULS NCS (MISCELLANEOUS) IMPLANT
GLOVE BIO SURGEON STRL SZ7.5 (GLOVE) ×6 IMPLANT
GLOVE BIOGEL PI IND STRL 8 (GLOVE) ×1 IMPLANT
GLOVE BIOGEL PI INDICATOR 8 (GLOVE) ×2
GOWN STRL REUS W/ TWL LRG LVL3 (GOWN DISPOSABLE) ×1 IMPLANT
GOWN STRL REUS W/ TWL XL LVL3 (GOWN DISPOSABLE) ×1 IMPLANT
GOWN STRL REUS W/TWL LRG LVL3 (GOWN DISPOSABLE) ×2
GOWN STRL REUS W/TWL XL LVL3 (GOWN DISPOSABLE) ×2
GRADUATE 1200CC STRL 31836 (MISCELLANEOUS) ×3 IMPLANT
INTRAOP MONITOR FEE IMPULS NCS (MISCELLANEOUS)
INTRAOP MONITOR FEE IMPULSE (MISCELLANEOUS)
KIT SPINAL PRONEVIEW (KITS) ×3 IMPLANT
KNIFE BAYONET SHORT DISCETOMY (MISCELLANEOUS) ×3 IMPLANT
MARKER SKIN DUAL TIP RULER LAB (MISCELLANEOUS) ×6 IMPLANT
NDL SAFETY ECLIPSE 18X1.5 (NEEDLE) ×3 IMPLANT
NEEDLE HYPO 18GX1.5 SHARP (NEEDLE) ×6
NS IRRIG 1000ML POUR BTL (IV SOLUTION) ×3 IMPLANT
PACK LAMINECTOMY NEURO (CUSTOM PROCEDURE TRAY) ×3 IMPLANT
PAD ARMBOARD 7.5X6 YLW CONV (MISCELLANEOUS) ×3 IMPLANT
PROBE MONO 100X0.75 ELECT 1.9M (MISCELLANEOUS) IMPLANT
SPOGE SURGIFLO 8M (HEMOSTASIS) ×2
SPONGE DRAIN TRACH 4X4 STRL 2S (GAUZE/BANDAGES/DRESSINGS) ×3 IMPLANT
SPONGE SURGIFLO 8M (HEMOSTASIS) ×1 IMPLANT
STAPLER SKIN PROX 35W (STAPLE) ×3 IMPLANT
SUT MNCRL AB 3-0 PS2 27 (SUTURE) IMPLANT
SUT NURALON 4 0 TR CR/8 (SUTURE) IMPLANT
SUT VIC AB 0 CT1 18XCR BRD 8 (SUTURE) ×3 IMPLANT
SUT VIC AB 0 CT1 8-18 (SUTURE) ×6
SUT VIC AB 2-0 CT1 18 (SUTURE) ×9 IMPLANT
SUT VICRYL 0 UR6 27IN ABS (SUTURE) ×3 IMPLANT
SYR 3ML LL SCALE MARK (SYRINGE) ×3 IMPLANT
SYRINGE 10CC LL (SYRINGE) ×3 IMPLANT
TOWEL OR 17X26 4PK STRL BLUE (TOWEL DISPOSABLE) ×12 IMPLANT
TUBING CONNECTING 10 (TUBING) ×2 IMPLANT
TUBING CONNECTING 10' (TUBING) ×1
WATER STERILE IRR 1000ML POUR (IV SOLUTION) ×3 IMPLANT

## 2016-12-14 NOTE — Op Note (Signed)
Date: 12/14/2016  Attending: Dr. Era Bumpers  First Assistant: Marin Olp, PA  Diagnosis: Lumbar stenosis with neurogenic claudication and radiculopathy  Postoperative diagnosis: same  Indications: failed conservative measures, progressive limitation ambulating  Procedure: Lumbar laminectomy L2/3 and L4/5  Findings: lumbar stenosis  Anesthesia:  GETA; 10cc 0.25% bupivicaine with epinephrine  Specimen: none  EBL: 50cc  Crystalloid: 500cc  Disposition: PACU; floor  Preoperative Note:    Risks of surgery discussed include: infection, bleeding, stroke, coma, death, paralysis, CSF leak, nerve/spinal cord injury, numbness, tingling, weakness, complex regional pain syndrome, recurrent stenosis and/or disc herniation, vascular injury, development of instability, neck/back pain, need for further surgery, persistent symptoms, development of deformity, and the risks of anesthesia. They understood these risks and have agreed to proceed.  Operative Note:   The patient was brought from the preoperative center with intravenous access established.  The patient underwent general anesthesia and endotracheal tube intubation.  Once this was complete the patient was then rotated over to the [facility] operative table.  All pressure points were appropriately padded the skin was thoroughly cleansed and fluoroscopy was brought into the field and we identified the L4/5  And L2/3level with sponge stick and the use of a marking pen.  Perioperative antibiotics were administered and sterile prep and drapes were then applied.  A timeout was then observed.  A midline linear incision was then opened with a #10 blade knife and hemostasis was obtained with a bipolar.  A weitlander retractor was then advanced.  The fascia was then opened with Bovie cauterization and subperiosteal dissection was carried out along the lamina of L4/5 and L2/3 and this was verified under lateral imaging.  Cerebellar and subsequently deep  Gelpi retractors were then placed careful attention was placed not to violate any facet capsules.  Fluoroscopy was then brought back into the field and with the use of a Penfield 4 and marking pen the L4/5 and L2/3 level was established.  A Leksell rongeur was then used to remove portions of the spinous process  At L4/5 (most severe level) of the rostral and caudal level.  A 3 mm matchstick drill bit was then used to complete the laminectomy until the ligamentum flavum was exposed and hemostasis was obtained with Surgifoam and bone wax.  The ligamentum flavum was dissected from the interlaminar with the use of a 3-0 up-angled curette and further resected with a #2 #3 #4 biting Kerrison.  This was extended rostrally and caudally extending to the pedicle levels of the corresponding vertebral body levels.  Careful attention was placed to not violate the pars in addition to this.  The ligamentum flavum was dissected with a 6-0 up-angled curette and further resected with a #2 #3 #4 biting Kerrison.  The dura was visualized deep to this.  A Penfield 4 was used to mobilize the thecal sac and a 3-0 and 6-0 up-angle curette were used to dissect the lateral recess and resected with a #2 #3 biting Kerrisons.  Once this was complete no evidence of CSF leak was noted after a Valsalva maneuver was performed with the thecal sac having improved relaxation attention was then turned to the L2/3 level where the same technique was used and complete for decompression across this segment. I repeated lateral imaging verifying I was decompressed across the junctional segments. Attention was then turned to closure.  The wound was copiously irrigated and a #10 round JP drain was tunneled the rostral aspect of the incision anchored in place with a 2-0  Vicryl suture.  The retractors were removed and the bipolar was then used to hemostase the muscular layers and lateral gutters of the laminectomy bed with flo-seal and patty compression. Bone  wax was used on residual laminectomy edges for hemostasis as well.  The muscular and fascial layers were reapproximated using 0 Vicryl suture.  The subcutaneous tissue layer was reapproximated using 2-0 Vicryl suture.  The skin was then cleansed and staples were then used to reapproximate the skin.  The JP drain was then connected under sterile conditions antibiotic ointment and a sterile dressing was then applied over the skin.  The patient was then rotated back to their preoperative bed awakened from anesthesia extubated and taken to recovery.  All counts are correct at the end of this case.    Era Bumpers, MD

## 2016-12-14 NOTE — Anesthesia Postprocedure Evaluation (Signed)
Anesthesia Post Note  Patient: Carrie Mills  Procedure(s) Performed: LUMBAR LAMINECTOMY2 LEVELS- L2-3,L4-5 (N/A Back)  Patient location during evaluation: PACU Anesthesia Type: General Level of consciousness: awake and alert Pain management: pain level controlled Vital Signs Assessment: post-procedure vital signs reviewed and stable Respiratory status: spontaneous breathing, nonlabored ventilation, respiratory function stable and patient connected to nasal cannula oxygen Cardiovascular status: blood pressure returned to baseline and stable Postop Assessment: no apparent nausea or vomiting Anesthetic complications: no     Last Vitals:  Vitals:   12/14/16 1043 12/14/16 1102  BP:  126/67  Pulse: 75 74  Resp: 12 12  Temp:  (!) 36.3 C  SpO2: 92% 95%    Last Pain:  Vitals:   12/14/16 1103  TempSrc:   PainSc: 3                  Molli Barrows

## 2016-12-14 NOTE — Transfer of Care (Signed)
Immediate Anesthesia Transfer of Care Note  Patient: Carrie Mills  Procedure(s) Performed: LUMBAR LAMINECTOMY2 LEVELS- L2-3,L4-5 (N/A Back)  Patient Location: PACU  Anesthesia Type:General  Level of Consciousness: drowsy and patient cooperative  Airway & Oxygen Therapy: Patient Spontanous Breathing and Patient connected to face mask oxygen  Post-op Assessment: Report given to RN, Post -op Vital signs reviewed and stable and Patient moving all extremities X 4  Post vital signs: Reviewed and stable  Last Vitals:  Vitals:   12/14/16 0626 12/14/16 0949  BP: (!) 157/70 139/69  Pulse: 61 87  Resp: 16 15  Temp: (!) 35.8 C (!) 36.2 C  SpO2: 97% 98%    Last Pain:  Vitals:   12/14/16 0949  TempSrc: Temporal  PainSc: Asleep         Complications: No apparent anesthesia complications, moved extremities prior to transport to PACU, moving and removing blankets upon transport to PACU so medicated for pain upon arrival, immediately relaxed and sleeping but responds easily to voice.

## 2016-12-14 NOTE — Anesthesia Preprocedure Evaluation (Signed)
Anesthesia Evaluation  Patient identified by MRN, date of birth, ID band Patient awake    Reviewed: Allergy & Precautions, H&P , NPO status , Patient's Chart, lab work & pertinent test results, reviewed documented beta blocker date and time   Airway Mallampati: II  TM Distance: >3 FB Neck ROM: full    Dental  (+) Teeth Intact, Upper Dentures   Pulmonary neg pulmonary ROS,    Pulmonary exam normal        Cardiovascular Exercise Tolerance: Poor hypertension, On Medications negative cardio ROS Normal cardiovascular exam Rhythm:regular Rate:Normal     Neuro/Psych negative neurological ROS  negative psych ROS   GI/Hepatic negative GI ROS, Neg liver ROS,   Endo/Other  negative endocrine ROSdiabetes, Well Controlled  Renal/GU negative Renal ROS  negative genitourinary   Musculoskeletal   Abdominal   Peds  Hematology negative hematology ROS (+)   Anesthesia Other Findings Past Medical History: No date: Allergy No date: Anxiety No date: Cancer [facility])     Comment:  Basal Cell Skin Cancer No date: Colon polyps No date: DDD (degenerative disc disease), lumbar No date: Hyperglycemia No date: Hyperlipidemia No date: Hypertension 1959: Phlebitis     Comment:  Left Leg No date: Varicose veins Past Surgical History: 1987: ABDOMINAL HYSTERECTOMY 1987: APPENDECTOMY 2004: COLECTOMY 2009: COLONOSCOPY 12-27-12: COLONOSCOPY     Comment:  Dr Jamal Collin No date: JOINT REPLACEMENT 06/05/13: REPLACEMENT TOTAL KNEE; Left No date: THROMBECTOMY 1987: TOTAL ABDOMINAL HYSTERECTOMY W/ BILATERAL SALPINGOOPHORECTOMY   Reproductive/Obstetrics negative OB ROS                             Anesthesia Physical Anesthesia Plan  ASA: II  Anesthesia Plan: General ETT   Post-op Pain Management:    Induction:   PONV Risk Score and Plan: 4 or greater and Ondansetron, Dexamethasone, Midazolam and Propofol  infusion  Airway Management Planned:   Additional Equipment:   Intra-op Plan:   Post-operative Plan:   Informed Consent: I have reviewed the patients History and Physical, chart, labs and discussed the procedure including the risks, benefits and alternatives for the proposed anesthesia with the patient or authorized representative who has indicated his/her understanding and acceptance.   Dental Advisory Given  Plan Discussed with: CRNA  Anesthesia Plan Comments:         Anesthesia Quick Evaluation

## 2016-12-14 NOTE — Progress Notes (Signed)
Patient is admitted to room 137 from surgery. Honeycomb dressing is intact. JP drain in place. TEDs and SCDs in place. A&O x4. Daughter at bedside. Bed alarm on. IV fluids infusing. Oriented to room, call light, TV and bed controls.

## 2016-12-14 NOTE — Anesthesia Post-op Follow-up Note (Signed)
Anesthesia QCDR form completed.        

## 2016-12-14 NOTE — Progress Notes (Addendum)
  History: Carrie Mills is POD0 s/p lumbar laminectomy L2/3 and L4/5. Patient tolerated procedure well, no complications. Post operatively she is doing well. Complains of back pain 4-5/10 and an itchy eye, but otherwise has no other complaints. Symptoms prior to surgery have resolved.   Physical Exam: Vitals:   12/14/16 1300 12/14/16 1500  BP: 133/70 125/70  Pulse: 75 88  Resp: 14 16  Temp: 98.2 F (36.8 C) 97.9 F (36.6 C)  SpO2: 94% 95%    AA Ox3 CNI Strength:5/5 throughout, sensation intact low extremities.   Data:   Recent Labs Lab 12/14/16 0638  NA 140  K 3.2*  GLUCOSE 143*   No results for input(s): AST, ALT, ALKPHOS in the last 168 hours.  Invalid input(s): TBILI    Recent Labs Lab 12/14/16 0638  HGB 14.3  HCT 42.0   No results for input(s): APTT, INR in the last 168 hours.      Assessment/Plan:  Carrie Mills is doing well POD) s/p 2 level lumbar laminectomy. Pain had been controlled with Tylenol. Due to allergy, oxycodone was changed to Tramadol for additional pain management. Eye drops were also ordered to see if it would relieve itchy eye.  Physically patient is doing very well. Drain intact with minimal collection.   mobilize pain control DVT prophylaxis  Carrie Olp PA-C Department of Neurosurgery   Attending Note: I have seen and examined Carrie Mills. Lower extremity symptoms improved. Back pain primary issue. Plan for PT/OT, mobilize, continue JP drain and have case management assist with home needs. Likely dc tomorrow  Era Bumpers, MD

## 2016-12-14 NOTE — Anesthesia Procedure Notes (Signed)
Procedure Name: Intubation Date/Time: 12/14/2016 7:27 AM Performed by: Darlyne Russian Pre-anesthesia Checklist: Patient identified, Emergency Drugs available, Suction available, Patient being monitored and Timeout performed Patient Re-evaluated:Patient Re-evaluated prior to induction Oxygen Delivery Method: Circle system utilized Preoxygenation: Pre-oxygenation with 100% oxygen Induction Type: IV induction Ventilation: Mask ventilation without difficulty Laryngoscope Size: Mac and 3 Grade View: Grade I Tube type: Oral Tube size: 7.0 mm Number of attempts: 1 Airway Equipment and Method: Stylet Placement Confirmation: ETT inserted through vocal cords under direct vision,  positive ETCO2 and breath sounds checked- equal and bilateral Secured at: 22 cm Tube secured with: Tape Dental Injury: Teeth and Oropharynx as per pre-operative assessment

## 2016-12-14 NOTE — H&P (Signed)
Referring Physician:  Regino Bellow, PA [address] [place], [place] [id number]  Primary Physician:  Alisa Graff, MD, MD  Chief Complaint: Right greater than left leg pain  History of Present Illness: Carrie Mills is a 76 y.o. female who presents with the chief complaint of right greater than left leg discomfort that described as numbness and leg heaviness. She also has a cramping sensation in her right greater than left leg. She states she can walk approximately 100 yards before needing to rest. This is similar to distance as she could 1 year ago. Her right leg discomfort provides her much more limitation her quality of life than her left. She denies any loss of bowel bladder function. 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 She denies any tobacco use. She denies any prior lumbar spine surgery. Being upright is her worst position. She also notices a right lower back/buttock radiating pain that extends down her lower extremity but usually stops at the ankle. She has tried physical therapy and epidural steroid injections without sustained benefit. She presents today for scheduled OR after our last clinic visit. She denies any other changes to her history since our last evaluation.   Review of Systems:  A 10 point review of systems is negative, except for the pertinent positives and negatives detailed in the HPI.  Past Medical History: Past Medical History:  Diagnosis Date  . Asthma without status asthmaticus, unspecified  . Diabetes mellitus type 2, uncomplicated (CMS-HCC)  diet-controlled  . DVT (deep vein thrombosis) in pregnancy (CMS-HCC)  with 1st pregnancy  . Facial basal cell cancer  . Hemorrhoids  . Hyperlipidemia, unspecified  . Hypertension  . Phlebitis  . Varicella  . Ventricular ectopy   Past Surgical History: Past Surgical History:  Procedure Laterality Date  . History of colectomy 2004  with 8  inches removed due to polyp  . HYSTERECTOMY 1987  . Left total knee arthroplasty 06/05/2013  . Surgery for blood clot Left 1959  surgery for blood clot in left leg   Allergies: Allergies as of 10/20/2016 - Reviewed 10/20/2016  Allergen Reaction Noted  . Penicillins Hives and Syncope 08/09/2013  . Sulfa (sulfonamide antibiotics) Rash and Syncope 08/09/2013  . Dura-vent [phenylpropanolamine-gg] Other (See Comments) 08/09/2013  . Hismanal [astemizole] Other (See Comments) 08/09/2013  . Prednisone Other (See Comments) 08/09/2013   Medications: Outpatient Encounter Prescriptions as of 10/20/2016  Medication Sig Dispense Refill  . ACCU-CHEK AVIVA PLUS TEST STRP test strip 3  . beta carotene 10000 UNIT capsule Take 10,000 Units by mouth once daily.  . blood glucose diagnostic (ACCU-CHEK AVIVA PLUS TEST STRP) test strip TEST BLOOD SUGAR ONCE A DAY OR AS INSTRUCTED  . BREO ELLIPTA 200-25 mcg/dose DsDv INHALE 1 PUFF BY MOUTH ONCE A DAY 1  . calcium carbonate-vitamin D3 (CALCIUM 600 + D,3,) 600 mg(1,500mg ) -400 unit tablet Take 1 tablet by mouth once daily.  . carboxymethylcellulose (REFRESH TEARS) 0.5 % ophthalmic solution Place 1 drop into both eyes once daily.  . cephalexin (KEFLEX) 500 MG capsule TAKE 1 CAPSULE BY MOUTH TWICE A DAY 0  . cetirizine (ZYRTEC) 10 MG tablet Take 10 mg by mouth once daily.  . chromium picolinate 200 mcg Cap 200 mcg. Take 2 capsules by mouth once a day.  . citalopram (CELEXA) 10 MG tablet Take 10 mg by mouth once daily.  . clindamycin (CLEOCIN) 300 MG capsule Take 600 mg by mouth 1 hour  before dental procedure 2 capsule 2  . coconut oil 1,000 mg Cap 1,000 mg. Take 1 capsule by mouth once daily.  . COQ10, UBIQUINOL, ORAL 100 mg. Take 1 capsule by mouth once a day  . fluticasone (FLONASE) 50 mcg/actuation nasal spray Place 2 sprays into both nostrils once daily.  Marland Kitchen glucosamine sulfate 2KCl (GLUCOSAMINE RELIEF) 1,000 mg Tab Take 1,000 mg by mouth once daily.  .  hydrochlorothiazide (HYDRODIURIL) 25 MG tablet Take 25 mg by mouth once daily.  . metoprolol succinate (TOPROL-XL) 25 MG XL tablet Take 25 mg by mouth 2 (two) times daily.  . montelukast (SINGULAIR) 10 mg tablet Take 10 mg by mouth once daily.  . multivitamin tablet Take 1 tablet by mouth once daily.  . naproxen sodium (ALEVE, ANAPROX) 220 MG tablet Take 220 mg by mouth once daily.  Marland Kitchen omega-3 fatty acids (FISH OIL CONCENTRATE) 1,000 mg capsule Take 1 g by mouth once daily.  . pramoxine-hydrocortisone 1-1 % rectal cream PLACE 1 APPLICATION RECTALLY 2 (TWO) TIMES DAILY. 0  . pravastatin (PRAVACHOL) 40 MG tablet Take 40 mg by mouth nightly.  . pregabalin (LYRICA) 75 MG capsule Take 1 capsule (75 mg total) by mouth 2 (two) times daily. 60 capsule 0  . RESVERATROL ORAL 500 mg. Take 1 capsule by mouth once a day.  . rosuvastatin (CRESTOR) 5 MG tablet TAKE 1 TABLET (5 MG TOTAL) BY MOUTH DAILY. 1  . sodium chloride (SALINE NOSE) 0.65 % nasal spray Place 1 spray into both nostrils as needed for Congestion.   No facility-administered encounter medications on file as of 10/20/2016.   Social History: Social History  Substance Use Topics  . Smoking status: Never Smoker  . Smokeless tobacco: Never Used  . Alcohol use No   Family Medical History: Family History  Problem Relation Age of Onset  . Arthritis Mother  . High blood pressure (Hypertension) Mother  . Hyperlipidemia (Elevated cholesterol) Mother  . High blood pressure (Hypertension) Father  . Hyperlipidemia (Elevated cholesterol) Father  . Stroke Father  . Diabetes Father   Physical Examination: There were no vitals filed for this visit.  General: Patient is well developed, well nourished, calm, collected, and in no apparent distress.  Psychiatric: Patient is non-anxious.  Head: Pupils equal, round, and reactive to light.  ENT: Oral mucosa appears well hydrated.  Neck: Supple. Full range of motion.  Respiratory: Patient is  breathing without any difficulty.  Extremities: No edema.  Vascular: Palpable pulses.  Skin: On exposed skin, there are no abnormal skin lesions.  NEUROLOGICAL:  General: In no acute distress.  Awake, alert, oriented to person, place, and time. Pupils equal round and reactive to light. Facial tone is symmetric. Tongue protrusion is midline.   ROM of spine: full Palpation of spine: no TTP midline Neg straight leg raise   Strength:   Side Iliopsoas Quads Hamstring PF DF EHL  R           5            5             5              5    5    5   L             5           5              5  5    5    5   Reflexes are 2+ and symmetric at the patella (absent on left due to knee replacement)and achilleslower extremity sensation is intact to light touch  Clonus is not present. Gait is normal.  Chest: Clear bilaterally CV: S1S2. RRR  Imaging: MRI Lumbar Spine 04/2016:  IMPRESSION: 1. L4-5 severe spinal stenosis, primarily related to facet arthropathy with marked overgrowth and mild anterolisthesis. 2. L2-3 high-grade spinal stenosis. 3. L3-4 moderate spinal stenosis. 4. Mild to moderate bilateral foraminal narrowing from L2-3 to L5-S1.  I have personally reviewed the images and agree with the above interpretation.  Assessment and Plan: Ms. Bells is a pleasant 76 y.o. female with evidence of lumbar stenosis with neurogenic claudication that is progressed and failed conservative measures. I reviewed her imaging with her and recommended a lumbar decompression at L2-3 and L4/5 due to these levels showing severe canal stenosis. I will obtain flexion-extension x-rays as there is a very trace listhesis at L4-5 to ensure no evidence of dynamic motion. I have reviewed the risks and benefits as well as indications for surgery to which she understands. I discussed alternatives which would include repeat conservative measures but with her progression of symptoms through multiple rounds of  this I have recommended surgical intervention. She would like to proceed with surgery after a wedding in September and therefore will plan for December 14, 2016. Her labs have been reviewed. Lastly I will prescribe a Rollator to assist with fall prevention.  Thank you for involving me in the care of this patient. I will keep you apprised of the patient's progress

## 2016-12-15 DIAGNOSIS — M48062 Spinal stenosis, lumbar region with neurogenic claudication: Secondary | ICD-10-CM | POA: Diagnosis not present

## 2016-12-15 LAB — CREATININE, SERUM
CREATININE: 1.04 mg/dL — AB (ref 0.44–1.00)
GFR calc non Af Amer: 50 mL/min — ABNORMAL LOW (ref >60)
GFR, EST AFRICAN AMERICAN: 59 mL/min — AB (ref >60)

## 2016-12-15 LAB — GLUCOSE, CAPILLARY
GLUCOSE-CAPILLARY: 124 mg/dL — AB (ref 65–99)
GLUCOSE-CAPILLARY: 143 mg/dL — AB (ref 65–99)
Glucose-Capillary: 131 mg/dL — ABNORMAL HIGH (ref 65–99)

## 2016-12-15 MED ORDER — ENOXAPARIN SODIUM 40 MG/0.4ML ~~LOC~~ SOLN
40.0000 mg | SUBCUTANEOUS | Status: DC
Start: 2016-12-15 — End: 2016-12-16
  Administered 2016-12-15: 40 mg via SUBCUTANEOUS
  Filled 2016-12-15: qty 0.4

## 2016-12-15 NOTE — Progress Notes (Signed)
@  Progress Note   Date: 12/15/2016  24 Hours: POD 1 from L2/3 and L4/5 laminectomy States preop symptoms improved.   Problem List Patient Active Problem List   Diagnosis Date Noted  . Lumbar radiculopathy 12/14/2016  . Pre-op evaluation 11/28/2016  . Back pain 05/16/2015  . Right arm pain 05/16/2015  . Hemorrhoids 09/14/2014  . Stress 09/14/2014  . Health care maintenance 05/07/2014  . Knee pain, left 12/11/2013  . URI (upper respiratory infection) 03/29/2013  . Asymptomatic varicose veins 11/15/2012  . History of colonic polyps 11/15/2012  . Diabetes ([place]) 01/12/2012  . Hypercholesterolemia 01/12/2012  . Hypertension 01/12/2012  . Environmental allergies 01/12/2012    Medications: Scheduled Meds: . acetaminophen  1,000 mg Oral Q6H  . citalopram  10 mg Oral Daily  . enoxaparin (LOVENOX) injection  40 mg Subcutaneous Q24H  . fluticasone  1 spray Each Nare Daily  . fluticasone furoate-vilanterol  1 puff Inhalation Daily  . hydrochlorothiazide  25 mg Oral Daily  . insulin aspart  0-15 Units Subcutaneous TID WC  . metoprolol succinate  25 mg Oral BID  . montelukast  10 mg Oral Daily  . potassium chloride  10 mEq Oral Daily  . pregabalin  75 mg Oral BID  . rosuvastatin  5 mg Oral Daily   Continuous Infusions: . methocarbamol (ROBAXIN)  IV     PRN Meds:.acetaminophen **OR** acetaminophen, HYDROmorphone (DILAUDID) injection, methocarbamol **OR** methocarbamol (ROBAXIN)  IV, ondansetron **OR** ondansetron (ZOFRAN) IV, polyvinyl alcohol, senna-docusate, traMADol  Labs:  Results for orders placed or performed during the hospital encounter of 12/14/16 (from the past 24 hour(s))  Glucose, capillary   Collection Time: 12/14/16 11:44 AM  Result Value Ref Range   Glucose-Capillary 210 (H) 65 - 99 mg/dL  Glucose, capillary   Collection Time: 12/14/16  4:31 PM  Result Value Ref Range   Glucose-Capillary 215 (H) 65 - 99 mg/dL  Glucose, capillary   Collection Time: 12/14/16   9:41 PM  Result Value Ref Range   Glucose-Capillary 176 (H) 65 - 99 mg/dL  Glucose, capillary   Collection Time: 12/15/16  7:52 AM  Result Value Ref Range   Glucose-Capillary 124 (H) 65 - 99 mg/dL  Creatinine, serum   Collection Time: 12/15/16  9:01 AM  Result Value Ref Range   Creatinine, Ser 1.04 (H) 0.44 - 1.00 mg/dL   GFR calc non Af Amer 50 (L) >60 mL/min   GFR calc Af Amer 59 (L) >60 mL/min    Lab Results  Component Value Date   WBC 8.9 12/02/2016   WBC 8.0 05/19/2016   HCT 42.0 12/14/2016   HCT 39.6 12/02/2016   HCT 38.9 05/24/2013   PLT 248 12/02/2016   PLT 272.0 05/19/2016   PLT 171 06/07/2013   PLT 180 06/06/2013    Lab Results  Component Value Date   INR 0.90 12/02/2016   APTT 29 12/02/2016   Lab Results  Component Value Date   NA 140 12/14/2016   NA 136 12/02/2016   NA 133 (L) 06/07/2013   NA 134 (L) 06/06/2013   K 3.2 (L) 12/14/2016   K 4.1 12/11/2016   K 3.5 06/07/2013   K 3.3 (L) 06/06/2013   BUN 17 12/02/2016   BUN 19 11/25/2016   BUN 6 (L) 06/07/2013   BUN 10 06/06/2013   No results found for: MG  Imaging: No new imaging No results found.  Vital Signs: Temp:  [97.9 F (36.6 C)-98.3 F (36.8 C)] 97.9 F (  36.6 C) (10/02 0700) Pulse Rate:  [71-92] 82 (10/02 0700) Resp:  [14-19] 14 (10/02 0700) BP: (113-133)/(54-80) 114/57 (10/02 0700) SpO2:  [94 %-98 %] 98 % (10/02 0700) Temp (24hrs), Avg:98.1 F (36.7 C), Min:97.9 F (36.6 C), Max:98.3 F (36.8 C)  Weight: 95.7 kg (211 lb) @IOLAST2SHIFTS @  Physical Exam:  5/5 DF/PF KF/E Light touch intact Dressing: dry  Assessment and Plan:  76yo F POD 1 from L2/3 and L4/5 decompressive laminectomy; preop symptoms improved -mobilize -PT/OT -case management eval for home needs -dc ivf -lovenox prophylaxis -likely dc tomorrow     @ESIGNATURE @ Era Bumpers, MD    NOTE: This document was prepared using a combination of digital dictation and SmartPhrase techonology. Any transcriptional  errors that result from this process are unintentional.

## 2016-12-15 NOTE — Evaluation (Signed)
Occupational Therapy Evaluation Patient Details Name: Carrie Mills MRN: [id number] DOB: 1940/04/22 Today's Date: 12/15/2016    History of Present Illness Pt is a 76 y.o. female presenting to hospital with R>L leg discomfort (numbness and leg heaviness).  Pt s/p lumbar laminectomy L2/3 and L4/5 secondary to lumbar stenosis with neurogenic claudication and radiculopathy 12/14/16.  PMH includes asthma, DM, DVT, htn, and L TKA.   Clinical Impression   Pt seen for OT evaluation this date, POD#1 from above surgical procedure. Prior to hospital admission, pt was independent with ADL, modified independent with rollator, and "too many" falls in past 12 months due to RLE issues. Pt lives alone (spouse lives in [place] following MVA last year), but is planning to stay with her daughter and 3 adult grandchildren for approx. 1 1/2 weeks after surgery where she will have 24/7 assist as needed.  Currently pt is grossly moderate assist level for LB ADL due to back precautions and demonstrates decreased knowledge of precautions, use of DME/AE, and impaired balance and at a high risk of future falls. Pt educated in back precautions and maintaining precautions during ADL routine and mobility with handout provided, as well as AE/DME for LB ADL with verbal instruction, and visual demonstration. Pt verbalized understanding of all education/training provided.  Pt would benefit from skilled OT to address noted impairments and functional limitations (see below for any additional details) in order to maximize return to PLOF and minimize risk of future falls/injury/rehospitalization.  Upon hospital discharge, recommend pt discharge home with daughter and follow up therapy as recommended by the surgeon.     Follow Up Recommendations  DC plan and follow up therapy as arranged by surgeon;Supervision/Assistance - 24 hour    Equipment Recommendations  3 in 1 bedside commode    Recommendations for Other Services        Precautions / Restrictions Precautions Precautions: Fall;Back Precaution Booklet Issued: Yes (comment) Precaution Comments: No bending, arching, twisting.  JP drain. Restrictions Weight Bearing Restrictions: No      Mobility Bed Mobility       General bed mobility comments: deferred, pt up in recliner for OT evaluation  Transfers Overall transfer level: Needs assistance Equipment used: None Transfers: Sit to/from Stand Sit to Stand: Min guard         General transfer comment: increased effort to perform and stand    Balance Overall balance assessment: Needs assistance;History of Falls Sitting-balance support: No upper extremity supported;Feet supported Sitting balance-Leahy Scale: Good Sitting balance - Comments: sitting reaching within BOS   Standing balance support: Single extremity supported;During functional activity (use of IV pole) Standing balance-Leahy Scale: Poor Standing balance comment: improved balance noted with single UE support, slight LOB noted x2 during ambulation to/from bathroom but able to self correct                           ADL either performed or assessed with clinical judgement   ADL Overall ADL's : Needs assistance/impaired                                       General ADL Comments: grossly min-mod assist for LB ADL due to back precautions; educated in AE for LB ADL, pt verbalized understanding of verbal and visual demo provided.     Vision Baseline Vision/History: Wears glasses Wears Glasses: At all times Patient Visual  Report: No change from baseline Vision Assessment?: No apparent visual deficits     Perception     Praxis      Pertinent Vitals/Pain Pain Assessment: No/denies pain Pain Score: 1  Pain Location: low back pain Pain Descriptors / Indicators: Sore Pain Intervention(s): Limited activity within patient's tolerance;Monitored during session;Premedicated before session     Hand Dominance      Extremity/Trunk Assessment Upper Extremity Assessment Upper Extremity Assessment: Overall WFL for tasks assessed   Lower Extremity Assessment Lower Extremity Assessment: Overall WFL for tasks assessed   Cervical / Trunk Assessment Cervical / Trunk Assessment: Normal   Communication Communication Communication: No difficulties   Cognition Arousal/Alertness: Awake/alert Behavior During Therapy: WFL for tasks assessed/performed Overall Cognitive Status: Within Functional Limits for tasks assessed                                     General Comments  JP drain in place    Exercises Other Exercises Other Exercises: pt educated in falls prevention, home/routines modifications, and AE/DME for LB ADL to support maintaining back precautions   Shoulder Instructions      Home Living Family/patient expects to be discharged to:: Private residence Living Arrangements: Alone (plans to d/c home with daughter to daughter's home) Available Help at Discharge: Family;Available 24 hours/day (daughter and 3 adult grandchildren able to help) Type of Home: House Home Access: Stairs to enter [employer] of Steps: 1 (holds onto door for support) Entrance Stairs-Rails: None Home Layout: Multi-level;Able to live on main level with bedroom/bathroom (will stay on main level at daughter's home)     Bathroom Shower/Tub: Tub/shower unit;Walk-in shower (walk in shower in walk-in basement from back of house; tub/shower in main floor)   Bathroom Toilet: Standard     Home Equipment: Environmental consultant - 4 wheels;Adaptive equipment Adaptive Equipment: Reacher;Sock aid        Prior Functioning/Environment Level of Independence: Independent with assistive device(s)        Comments: Pt reports using rollator last 2 weeks d/t multiple falls (d/t LE symptom's) but reports no falls with use of rollator.  Husband is in LTC.         OT Problem List: Decreased activity tolerance;Decreased  safety awareness;Decreased knowledge of precautions;Decreased knowledge of use of DME or AE;Impaired balance (sitting and/or standing)      OT Treatment/Interventions: Self-care/ADL training;Therapeutic exercise;Therapeutic activities;Energy conservation;DME and/or AE instruction;Patient/family education    OT Goals(Current goals can be found in the care plan section) Acute Rehab OT Goals Patient Stated Goal: to walk more OT Goal Formulation: With patient Time For Goal Achievement: 12/29/16 Potential to Achieve Goals: Good  OT Frequency: Min 1X/week   Barriers to D/C:            Co-evaluation              AM-PAC PT "6 Clicks" Daily Activity     Outcome Measure Help from another person eating meals?: None Help from another person taking care of personal grooming?: None Help from another person toileting, which includes using toliet, bedpan, or urinal?: A Little Help from another person bathing (including washing, rinsing, drying)?: A Little Help from another person to put on and taking off regular upper body clothing?: None Help from another person to put on and taking off regular lower body clothing?: A Little 6 Click Score: 21   End of Session Equipment Utilized During Treatment: Gait  belt  Activity Tolerance: Patient tolerated treatment well Patient left: in chair;with call bell/phone within reach;with chair alarm set  OT Visit Diagnosis: Other abnormalities of gait and mobility (R26.89)                Time: 1050-1120 OT Time Calculation (min): 30 min Charges:  OT General Charges $OT Visit: 1 Visit OT Treatments $Self Care/Home Management : 23-37 mins G-Codes: OT G-codes **NOT FOR INPATIENT CLASS** Functional Assessment Tool Used: AM-PAC 6 Clicks Daily Activity;Clinical judgement Functional Limitation: Self care Self Care Current Status (U7253): At least 20 percent but less than 40 percent impaired, limited or restricted Self Care Goal Status (G6440): At least 1  percent but less than 20 percent impaired, limited or restricted   Jeni Salles, MPH, MS, OTR/L ascom [phone] 12/15/16, 1:50 PM

## 2016-12-15 NOTE — Evaluation (Signed)
Physical Therapy Evaluation Patient Details Name: Carrie Mills MRN: [id number] DOB: 1940-08-12 Today's Date: 12/15/2016   History of Present Illness  Pt is a 76 y.o. female presenting to hospital with R>L leg discomfort (numbness and leg heaviness).  Pt s/p lumbar laminectomy L2/3 and L4/5 secondary to lumbar stenosis with neurogenic claudication and radiculopathy 12/14/16.  PMH includes asthma, DM, DVT, htn, and L TKA.  Clinical Impression  Prior to hospital admission, pt was modified independent using rollator last couple weeks (pt was falling a lot without use of rollator d/t LE symptoms but has had no falls since use of rollator).  Pt lives alone but plans to discharge to daughter's home where she has 24/7 assist and 1 step to enter (will stay on main level).  Currently pt is CGA with bed mobility and transfers; CGA to min assist ambulating without AD (d/t unsteadiness) but CGA when ambulating with single UE support.  Pt declining use of SPC and would prefer to use rollator instead (because it has a place to sit).  Pt would benefit from skilled PT to address noted impairments and functional limitations (see below for any additional details).  Upon hospital discharge, plan for pt to discharge to daughter's home with 24/7 assist; further PT needs per surgeon.    Follow Up Recommendations DC plan and follow up therapy as arranged by surgeon    Equipment Recommendations   (pt has rollator at home)    Recommendations for Other Services       Precautions / Restrictions Precautions Precautions: Fall;Back Precaution Comments: No bending, arching, twisting.  JP drain. Restrictions Weight Bearing Restrictions: No      Mobility  Bed Mobility Overal bed mobility: Needs Assistance Bed Mobility: Rolling;Sidelying to Sit Rolling: Supervision Sidelying to sit: Min guard       General bed mobility comments: vc's for logrolling technique; use of bed rail; increased time and effort to  perform  Transfers Overall transfer level: Needs assistance Equipment used:  (use of bed rail with R UE) Transfers: Sit to/from Stand Sit to Stand: Min guard         General transfer comment: increased effort to perform and stand  Ambulation/Gait Ambulation/Gait assistance: Min guard;Min assist Ambulation Distance (Feet): 200 Feet Assistive device:  (L UE support on IV pole and then no AD)   Gait velocity: mildly decreased   General Gait Details: decreased B step length; steady with L UE support on IV pole x100 feet; CGA to min assist to steady with no UE support (some altered stepping pattern noted)  Stairs            Wheelchair Mobility    Modified Rankin (Stroke Patients Only)       Balance Overall balance assessment: Needs assistance;History of Falls Sitting-balance support: No upper extremity supported;Feet supported Sitting balance-Leahy Scale: Good Sitting balance - Comments: sitting reaching within BOS   Standing balance support: Single extremity supported;During functional activity (use of IV pole) Standing balance-Leahy Scale: Poor Standing balance comment: improved balance noted with single UE support                             Pertinent Vitals/Pain Pain Assessment: 0-10 Pain Score: 1  Pain Location: low back pain Pain Descriptors / Indicators: Sore Pain Intervention(s): Limited activity within patient's tolerance;Monitored during session;Repositioned  Vitals (HR and O2 on room air) stable and WFL throughout treatment session.    Home Living Family/patient expects  to be discharged to:: Private residence Living Arrangements: Alone (Plans to discharge to daughter's home (below information is daughter's home set-up)) Available Help at Discharge: Family;Available 24 hours/day (daughter and 3 adult grandchildren available to assist) Type of Home: House Home Access: Stairs to enter Entrance Stairs-Rails: None Entrance Stairs-Number of  Steps: 1 (holds onto door for support) Home Layout: Multi-level;Able to live on main level with bedroom/bathroom (Will stay on main level of daughter's home) Home Equipment: Walker - 4 wheels      Prior Function Level of Independence: Independent with assistive device(s)         Comments: Pt reports using rollator last 2 weeks d/t multiple falls (d/t LE symptom's) but reports no falls with use of rollator.  Husband is in NH.     Hand Dominance        Extremity/Trunk Assessment   Upper Extremity Assessment Upper Extremity Assessment: Overall WFL for tasks assessed    Lower Extremity Assessment Lower Extremity Assessment: Overall WFL for tasks assessed (intact B LE sensation and tone)    Cervical / Trunk Assessment Cervical / Trunk Assessment: Normal  Communication   Communication: No difficulties  Cognition Arousal/Alertness: Awake/alert Behavior During Therapy: WFL for tasks assessed/performed Overall Cognitive Status: Within Functional Limits for tasks assessed                                        General Comments General comments (skin integrity, edema, etc.): JP drain in place.  Nursing cleared pt for participation in physical therapy.  Pt agreeable to PT session.    Exercises     Assessment/Plan    PT Assessment Patient needs continued PT services  PT Problem List Decreased strength;Decreased balance;Decreased mobility;Decreased knowledge of use of DME;Decreased knowledge of precautions;Pain       PT Treatment Interventions DME instruction;Gait training;Stair training;Functional mobility training;Therapeutic activities;Therapeutic exercise;Balance training;Patient/family education    PT Goals (Current goals can be found in the Care Plan section)  Acute Rehab PT Goals Patient Stated Goal: to walk more PT Goal Formulation: With patient Time For Goal Achievement: 12/29/16 Potential to Achieve Goals: Good    Frequency BID   Barriers to  discharge        Co-evaluation               AM-PAC PT "6 Clicks" Daily Activity  Outcome Measure Difficulty turning over in bed (including adjusting bedclothes, sheets and blankets)?: A Little Difficulty moving from lying on back to sitting on the side of the bed? : A Lot Difficulty sitting down on and standing up from a chair with arms (e.g., wheelchair, bedside commode, etc,.)?: A Little Help needed moving to and from a bed to chair (including a wheelchair)?: A Little Help needed walking in hospital room?: A Little Help needed climbing 3-5 steps with a railing? : A Little 6 Click Score: 17    End of Session Equipment Utilized During Treatment: Gait belt Activity Tolerance: Patient tolerated treatment well Patient left: in chair;with call bell/phone within reach;with chair alarm set Nurse Communication: Mobility status;Precautions PT Visit Diagnosis: Other abnormalities of gait and mobility (R26.89);Muscle weakness (generalized) (M62.81);History of falling (Z91.81)    Time: 8250-5397 PT Time Calculation (min) (ACUTE ONLY): 24 min   Charges:   PT Evaluation $PT Eval Low Complexity: 1 Low     PT G Codes:   PT G-Codes **NOT FOR INPATIENT CLASS** Functional  Assessment Tool Used: AM-PAC 6 Clicks Basic Mobility Functional Limitation: Mobility: Walking and moving around Mobility: Walking and Moving Around Current Status [phone]): At least 40 percent but less than 60 percent impaired, limited or restricted Mobility: Walking and Moving Around Goal Status [phone]): 0 percent impaired, limited or restricted    Leitha Bleak, PT 12/15/16, 10:26 AM [phone]

## 2016-12-15 NOTE — Care Management Note (Signed)
Case Management Note  Patient Details  Name: Carrie Mills MRN: [id number] Date of Birth: 1940/03/22  Subjective/Objective:                  Met with patient to discuss discharge planning. She is from home alone. Her husband is in long-term care at nursing facility. She plans to go to her daughter's address at discharge: [address] [place]. PT is recommending outpatient PT and states that they believe patient can manage with a rollator. She requests a bedside commode.  Action/Plan:   Referral to [facility]. RNCM will follow.   Expected Discharge Date:  12/15/16               Expected Discharge Plan:     In-House Referral:     Discharge planning Services     Post Acute Care Choice:  Durable Medical Equipment Choice offered to:  Patient  DME Arranged:  3-N-1 DME Agency:  [place]:    [facility] Agency:     Status of Service:  In process, will continue to follow  If discussed at Long Length of Stay Meetings, dates discussed:    Additional Comments:  Marshell Garfinkel, RN 12/15/2016, 10:07 AM

## 2016-12-15 NOTE — Care Management Obs Status (Signed)
[place] NOTIFICATION   Patient Details  Name: OREAN GIARRATANO MRN: [id number] Date of Birth: 02/05/1941   Medicare Observation Status Notification Given:  Yes    Marshell Garfinkel, RN 12/15/2016, 10:06 AM

## 2016-12-15 NOTE — Progress Notes (Signed)
Physical Therapy Treatment Patient Details Name: Carrie Mills MRN: [id number] DOB: 11/06/1940 Today's Date: 12/15/2016    History of Present Illness Pt is a 76 y.o. female presenting to hospital with R>L leg discomfort (numbness and leg heaviness).  Pt s/p lumbar laminectomy L2/3 and L4/5 secondary to lumbar stenosis with neurogenic claudication and radiculopathy 12/14/16.  PMH includes asthma, DM, DVT, htn, and L TKA.    PT Comments    Pt requiring vc's for logrolling in bed (for spinal precautions) and technique to improve ease of sit to/from stand.  Pt feeling unsteady this afternoon and requesting to use RW (pt mostly steady with RW but was a little unsteady at times with head turns but still only requiring CGA).  Pt overall progressing well with minimal pain during session and 0/10 pain end of session resting in bed.  Pt's daughter reports she has ramp to enter her home (pt does not have to do any stairs).  Will continue to focus on strengthening, balance, and progressive functional mobility per pt tolerance.    Follow Up Recommendations  DC plan and follow up therapy as arranged by surgeon     Equipment Recommendations   (pt has rollator at home)    Recommendations for Other Services       Precautions / Restrictions Precautions Precautions: Fall;Back Precaution Booklet Issued: Yes (comment) Precaution Comments: No bending, arching, twisting.  JP drain. Restrictions Weight Bearing Restrictions: No    Mobility  Bed Mobility Overal bed mobility: Needs Assistance Bed Mobility: Rolling;Sidelying to Sit;Sit to Sidelying Rolling: Supervision Sidelying to sit: Min guard     Sit to sidelying: Min guard General bed mobility comments: vc's for logrolling technique required supine to/from sit  Transfers Overall transfer level: Needs assistance Equipment used: None Transfers: Sit to/from Stand Sit to Stand: Min guard         General transfer comment: vc's for technique  (scoot forward; feet, UE, and trunk positioning)  Ambulation/Gait Ambulation/Gait assistance: Min guard Ambulation Distance (Feet): 200 Feet Assistive device: Rolling walker (2 wheeled)   Gait velocity: mildly decreased   General Gait Details: decreased B step length; mildly unsteady with head turns occasionally but overall steady   Stairs            Wheelchair Mobility    Modified Rankin (Stroke Patients Only)       Balance Overall balance assessment: Needs assistance;History of Falls Sitting-balance support: No upper extremity supported;Feet supported Sitting balance-Leahy Scale: Good Sitting balance - Comments: sitting reaching within BOS   Standing balance support: During functional activity;Bilateral upper extremity supported (use of RW) Standing balance-Leahy Scale: Poor Standing balance comment: requires UE support for balance                            Cognition Arousal/Alertness: Awake/alert Behavior During Therapy: WFL for tasks assessed/performed Overall Cognitive Status: Within Functional Limits for tasks assessed                                        Exercises     General Comments General comments (skin integrity, edema, etc.): JP drain in place.  Pt agreeable to PT session.      Pertinent Vitals/Pain Pain Assessment: No/denies pain Pain Score: 0-No pain Pain Intervention(s): Limited activity within patient's tolerance;Monitored during session;Repositioned    Home Living Family/patient expects to be  discharged to:: Private residence Living Arrangements: Alone (plans to d/c home with daughter to daughter's home) Available Help at Discharge: Family;Available 24 hours/day (daughter and 3 adult grandchildren able to help) Type of Home: House Home Access: Stairs to enter Entrance Stairs-Rails: None Home Layout: Multi-level;Able to live on main level with bedroom/bathroom (will stay on main level at daughter's  home) Home Equipment: Gilford Rile - 4 wheels;Adaptive equipment      Prior Function Level of Independence: Independent with assistive device(s)      Comments: Pt reports using rollator last 2 weeks d/t multiple falls (d/t LE symptom's) but reports no falls with use of rollator.  Husband is in LTC.    PT Goals (current goals can now be found in the care plan section) Acute Rehab PT Goals Patient Stated Goal: to walk more PT Goal Formulation: With patient Time For Goal Achievement: 12/29/16 Potential to Achieve Goals: Good Progress towards PT goals: Progressing toward goals    Frequency    BID      PT Plan Current plan remains appropriate    Co-evaluation              AM-PAC PT "6 Clicks" Daily Activity  Outcome Measure  Difficulty turning over in bed (including adjusting bedclothes, sheets and blankets)?: A Little Difficulty moving from lying on back to sitting on the side of the bed? : A Lot Difficulty sitting down on and standing up from a chair with arms (e.g., wheelchair, bedside commode, etc,.)?: A Little Help needed moving to and from a bed to chair (including a wheelchair)?: A Little Help needed walking in hospital room?: A Little Help needed climbing 3-5 steps with a railing? : A Little 6 Click Score: 17    End of Session Equipment Utilized During Treatment: Gait belt Activity Tolerance: Patient tolerated treatment well Patient left: in bed;with call bell/phone within reach;with bed alarm set;with nursing/sitter in room (B heels elevated via pillow) Nurse Communication: Mobility status;Precautions PT Visit Diagnosis: Other abnormalities of gait and mobility (R26.89);Muscle weakness (generalized) (M62.81);History of falling (Z91.81)     Time: 6803-2122 PT Time Calculation (min) (ACUTE ONLY): 23 min  Charges:  $Gait Training: 8-22 mins $Therapeutic Activity: 8-22 mins                    G CodesLeitha Bleak, PT 12/15/16, 5:05  PM [phone]

## 2016-12-16 DIAGNOSIS — M48062 Spinal stenosis, lumbar region with neurogenic claudication: Secondary | ICD-10-CM | POA: Diagnosis not present

## 2016-12-16 LAB — GLUCOSE, CAPILLARY: GLUCOSE-CAPILLARY: 163 mg/dL — AB (ref 65–99)

## 2016-12-16 MED ORDER — METHOCARBAMOL 500 MG PO TABS: 500.0000 mg | ORAL_TABLET | Freq: Four times a day (QID) | ORAL | 0 refills | Status: DC | PRN

## 2016-12-16 MED ORDER — TRAMADOL HCL 50 MG PO TABS
50.0000 mg | ORAL_TABLET | Freq: Four times a day (QID) | ORAL | 0 refills | Status: DC | PRN
Start: 2016-12-16 — End: 2017-05-27

## 2016-12-16 NOTE — Progress Notes (Signed)
Patient is alert and oriented and able to verbalize needs. Medicated for back pain at this time. Daughter at bedside. VSS. PIV removed. Discharge instructions gone over with patient and daughter at this time. Printed AVS and rx for Tramadol provided for patient. Patient and daughter both verbalize understanding of all discharge instructions and follow up care. No concerns voiced at this time. All belongings packed up. Daughter will provide transportation home.  Bethann Punches, RN

## 2016-12-16 NOTE — Progress Notes (Signed)
PT Cancellation Note  Patient Details Name: EMYLIE AMSTER MRN: [id number] DOB: 06-17-1940   Cancelled Treatment:    Reason Eval/Treat Not Completed: Other (comment).  PT treatment session attempted this morning but pt not in room and has already discharged from hospital.  Please follow-up with MD for further PT needs.  Leitha Bleak, PT 12/16/16, 9:07 AM [phone]

## 2016-12-16 NOTE — Discharge Instructions (Signed)

## 2016-12-16 NOTE — Care Management Note (Signed)
Case Management Note  Patient Details  Name: KAYNA SUPPA MRN: [id number] Date of Birth: 01-01-1941  Subjective/Objective:  Discharging today                  Action/Plan: Patient to [place] home with daughter and has refused OP PT. Bedside commode delivered. Case closed   Expected Discharge Date:  12/16/16               Expected Discharge Plan:  Home/Self Care  In-House Referral:     Discharge planning Services  CM Consult  Post Acute Care Choice:  Durable Medical Equipment Choice offered to:  Patient  DME Arranged:  3-N-1 DME Agency:  [place]:    [facility] Agency:     Status of Service:  Completed, signed off  If discussed at [place] of Stay Meetings, dates discussed:    Additional Comments:  Jolly Mango, RN 12/16/2016, 8:56 AM

## 2016-12-16 NOTE — Discharge Summary (Signed)
  History: Carrie Mills is POD2 from lumbar laminectomy at L2/3 and L4/5. Symptoms prior to surgery have resolved. Patient complains of low back soreness but has adequately been controlled with tramadol and robaxin. Drain output has stabilized POD2 to 27ml output. Patient able to ambulate, void, and eat without issue.   Physical Exam: Vitals:   12/15/16 1545 12/15/16 1955  BP: (!) 116/52 (!) 144/62  Pulse: 72 66  Resp: 18 18  Temp: 98.4 F (36.9 C) 98.9 F (37.2 C)  SpO2: 98% 96%    AA Ox3 CNI Strength:5/5 throughout, sensation intact and symmetric upper and lower extremities.  Skin: Incision site intact. No drainage    Data:   Recent Labs Lab 12/14/16 0638 12/15/16 0901  NA 140  --   K 3.2*  --   CREATININE  --  1.04*  GLUCOSE 143*  --    No results for input(s): AST, ALT, ALKPHOS in the last 168 hours.  Invalid input(s): TBILI    Recent Labs Lab 12/14/16 0638  HGB 14.3  HCT 42.0   No results for input(s): APTT, INR in the last 168 hours.      Assessment/Plan:  Carrie Mills is recovering well s/p 2 level lumbar laminectomy. Patient informed of post op instructions of no bending, twisting, and lifting great than 10 pound for 6 weeks. Physical therapy is set up for home visits. Follow up is scheduled in two weeks at clinic. Continued pain control with tramadol and robaxin.  Incision site intact and drain was removed.   Marin Olp PA-C Department of Neurosurgery

## 2016-12-29 ENCOUNTER — Other Ambulatory Visit: Payer: Medicare Other

## 2016-12-31 ENCOUNTER — Other Ambulatory Visit: Payer: Self-pay | Admitting: Internal Medicine

## 2017-01-17 ENCOUNTER — Other Ambulatory Visit: Payer: Self-pay | Admitting: Internal Medicine

## 2017-01-19 ENCOUNTER — Other Ambulatory Visit: Payer: Self-pay

## 2017-01-19 MED ORDER — CITALOPRAM HYDROBROMIDE 10 MG PO TABS: 10.0000 mg | ORAL_TABLET | Freq: Every day | ORAL | 0 refills | Status: DC

## 2017-01-21 ENCOUNTER — Other Ambulatory Visit: Payer: Self-pay | Admitting: Internal Medicine

## 2017-01-22 ENCOUNTER — Other Ambulatory Visit (INDEPENDENT_AMBULATORY_CARE_PROVIDER_SITE_OTHER): Payer: Medicare Other

## 2017-01-22 ENCOUNTER — Telehealth: Payer: Self-pay | Admitting: Radiology

## 2017-01-22 ENCOUNTER — Ambulatory Visit: Payer: Medicare Other

## 2017-01-22 DIAGNOSIS — E7800 Pure hypercholesterolemia, unspecified: Secondary | ICD-10-CM

## 2017-01-22 DIAGNOSIS — E119 Type 2 diabetes mellitus without complications: Secondary | ICD-10-CM | POA: Diagnosis not present

## 2017-01-22 DIAGNOSIS — E876 Hypokalemia: Secondary | ICD-10-CM

## 2017-01-22 LAB — POTASSIUM: Potassium: 3.2 mEq/L — ABNORMAL LOW (ref 3.5–5.1)

## 2017-01-22 NOTE — Telephone Encounter (Signed)
As far as I can tell this is just a potassium check.  I do not see where anything else is recommended.

## 2017-01-22 NOTE — Telephone Encounter (Signed)
Ok just wanted to verify. Thank you

## 2017-01-22 NOTE — Telephone Encounter (Signed)
Pt came in for labs today and stated that there were labs that were placed back in September, released all labs that were ordered in September but upon review of chart, A1C would be to soon. Is this just for potassium? Just wanted to double check. Thank you.

## 2017-01-25 ENCOUNTER — Other Ambulatory Visit: Payer: Self-pay | Admitting: Internal Medicine

## 2017-01-25 DIAGNOSIS — E876 Hypokalemia: Secondary | ICD-10-CM

## 2017-01-25 MED ORDER — POTASSIUM CHLORIDE CRYS ER 10 MEQ PO TBCR: 10.0000 meq | EXTENDED_RELEASE_TABLET | Freq: Every day | ORAL | 0 refills | Status: DC

## 2017-01-25 NOTE — Addendum Note (Signed)
Addended by: Francella Solian on: 01/25/2017 09:48 AM   Modules accepted: Orders

## 2017-01-25 NOTE — Progress Notes (Signed)
Order placed for f/u potassium.  

## 2017-02-08 ENCOUNTER — Other Ambulatory Visit (INDEPENDENT_AMBULATORY_CARE_PROVIDER_SITE_OTHER): Payer: Medicare Other

## 2017-02-08 DIAGNOSIS — E119 Type 2 diabetes mellitus without complications: Secondary | ICD-10-CM | POA: Diagnosis not present

## 2017-02-08 DIAGNOSIS — E876 Hypokalemia: Secondary | ICD-10-CM | POA: Diagnosis not present

## 2017-02-08 DIAGNOSIS — E7800 Pure hypercholesterolemia, unspecified: Secondary | ICD-10-CM

## 2017-02-08 LAB — LIPID PANEL
CHOL/HDL RATIO: 6
CHOLESTEROL: 213 mg/dL — AB (ref 0–200)
HDL: 36.2 mg/dL — ABNORMAL LOW (ref >39.00)
NonHDL: 176.95
TRIGLYCERIDES: 245 mg/dL — AB (ref 0.0–149.0)
VLDL: 49 mg/dL — AB (ref 0.0–40.0)

## 2017-02-08 LAB — BASIC METABOLIC PANEL
BUN: 17 mg/dL (ref 6–23)
CO2: 32 mEq/L (ref 19–32)
Calcium: 10 mg/dL (ref 8.4–10.5)
Chloride: 97 mEq/L (ref 96–112)
Creatinine, Ser: 1.18 mg/dL (ref 0.40–1.20)
GFR: 47.08 mL/min — AB (ref >60.00)
GLUCOSE: 206 mg/dL — AB (ref 70–99)
POTASSIUM: 3.6 meq/L (ref 3.5–5.1)
SODIUM: 136 meq/L (ref 135–145)

## 2017-02-08 LAB — HEPATIC FUNCTION PANEL
ALBUMIN: 4.3 g/dL (ref 3.5–5.2)
ALT: 22 U/L (ref 0–35)
AST: 25 U/L (ref 0–37)
Alkaline Phosphatase: 61 U/L (ref 39–117)
BILIRUBIN DIRECT: 0.1 mg/dL (ref 0.0–0.3)
TOTAL PROTEIN: 7.5 g/dL (ref 6.0–8.3)
Total Bilirubin: 0.9 mg/dL (ref 0.2–1.2)

## 2017-02-08 LAB — LDL CHOLESTEROL, DIRECT: LDL DIRECT: 142 mg/dL

## 2017-02-08 LAB — HEMOGLOBIN A1C: Hgb A1c MFr Bld: 7.1 % — ABNORMAL HIGH (ref 4.6–6.5)

## 2017-02-09 ENCOUNTER — Other Ambulatory Visit: Payer: Self-pay | Admitting: Internal Medicine

## 2017-02-09 DIAGNOSIS — R7989 Other specified abnormal findings of blood chemistry: Secondary | ICD-10-CM

## 2017-02-09 NOTE — Progress Notes (Signed)
Order placed for f/u lab.   

## 2017-02-21 ENCOUNTER — Other Ambulatory Visit: Payer: Self-pay | Admitting: Internal Medicine

## 2017-03-01 ENCOUNTER — Telehealth: Payer: Self-pay | Admitting: Internal Medicine

## 2017-03-01 ENCOUNTER — Other Ambulatory Visit (INDEPENDENT_AMBULATORY_CARE_PROVIDER_SITE_OTHER): Payer: Medicare Other

## 2017-03-01 ENCOUNTER — Other Ambulatory Visit: Payer: Self-pay | Admitting: Internal Medicine

## 2017-03-01 DIAGNOSIS — R7989 Other specified abnormal findings of blood chemistry: Secondary | ICD-10-CM | POA: Diagnosis not present

## 2017-03-01 DIAGNOSIS — E876 Hypokalemia: Secondary | ICD-10-CM

## 2017-03-01 LAB — BASIC METABOLIC PANEL
BUN: 15 mg/dL (ref 6–23)
CHLORIDE: 97 meq/L (ref 96–112)
CO2: 33 mEq/L — ABNORMAL HIGH (ref 19–32)
CREATININE: 1.04 mg/dL (ref 0.40–1.20)
Calcium: 9.3 mg/dL (ref 8.4–10.5)
GFR: 54.46 mL/min — AB (ref >60.00)
Glucose, Bld: 198 mg/dL — ABNORMAL HIGH (ref 70–99)
Potassium: 3.4 mEq/L — ABNORMAL LOW (ref 3.5–5.1)
Sodium: 136 mEq/L (ref 135–145)

## 2017-03-01 NOTE — Telephone Encounter (Signed)
Pt dropped off her BP readings that Dr. Nicki Reaper wanted her to do. Paper is up front in Dr. Bary Leriche color folder.

## 2017-03-01 NOTE — Telephone Encounter (Signed)
Paper has been placed in provider folder.

## 2017-03-01 NOTE — Progress Notes (Signed)
Order placed for f/u potassium check.  

## 2017-03-05 ENCOUNTER — Encounter: Payer: Self-pay | Admitting: *Deleted

## 2017-03-15 ENCOUNTER — Telehealth: Payer: Self-pay | Admitting: Internal Medicine

## 2017-03-15 MED ORDER — ROSUVASTATIN CALCIUM 10 MG PO TABS: 10.0000 mg | ORAL_TABLET | Freq: Every day | ORAL | 1 refills | Status: DC

## 2017-03-15 NOTE — Telephone Encounter (Signed)
rx sent in for 10mg  of crestor #90 with one refill - per last result note recommendation.  Also sent  My chart message to pt regarding blood sugars she sent in.

## 2017-03-24 ENCOUNTER — Other Ambulatory Visit (INDEPENDENT_AMBULATORY_CARE_PROVIDER_SITE_OTHER): Payer: Medicare Other

## 2017-03-24 DIAGNOSIS — E876 Hypokalemia: Secondary | ICD-10-CM | POA: Diagnosis not present

## 2017-03-24 LAB — POTASSIUM: POTASSIUM: 3.1 meq/L — AB (ref 3.5–5.1)

## 2017-03-26 ENCOUNTER — Other Ambulatory Visit: Payer: Self-pay

## 2017-03-26 MED ORDER — TRIAMTERENE-HCTZ 37.5-25 MG PO TABS: 1.0000 | ORAL_TABLET | Freq: Every day | ORAL | 3 refills | Status: DC

## 2017-03-28 ENCOUNTER — Other Ambulatory Visit: Payer: Self-pay | Admitting: Internal Medicine

## 2017-03-28 DIAGNOSIS — E876 Hypokalemia: Secondary | ICD-10-CM

## 2017-03-28 NOTE — Progress Notes (Signed)
Order placed for f/u met b 

## 2017-04-01 ENCOUNTER — Other Ambulatory Visit: Payer: Self-pay | Admitting: Internal Medicine

## 2017-04-05 ENCOUNTER — Other Ambulatory Visit (INDEPENDENT_AMBULATORY_CARE_PROVIDER_SITE_OTHER): Payer: Medicare Other

## 2017-04-05 DIAGNOSIS — E876 Hypokalemia: Secondary | ICD-10-CM

## 2017-04-05 LAB — BASIC METABOLIC PANEL
BUN: 16 mg/dL (ref 6–23)
CALCIUM: 9.6 mg/dL (ref 8.4–10.5)
CHLORIDE: 96 meq/L (ref 96–112)
CO2: 32 meq/L (ref 19–32)
CREATININE: 1.17 mg/dL (ref 0.40–1.20)
GFR: 47.53 mL/min — ABNORMAL LOW (ref >60.00)
GLUCOSE: 247 mg/dL — AB (ref 70–99)
Potassium: 3.4 mEq/L — ABNORMAL LOW (ref 3.5–5.1)
Sodium: 136 mEq/L (ref 135–145)

## 2017-04-06 ENCOUNTER — Other Ambulatory Visit: Payer: Self-pay | Admitting: Internal Medicine

## 2017-04-06 DIAGNOSIS — E876 Hypokalemia: Secondary | ICD-10-CM

## 2017-04-06 NOTE — Progress Notes (Signed)
Order placed for f/u potassium.  

## 2017-04-15 ENCOUNTER — Other Ambulatory Visit: Payer: Self-pay | Admitting: Internal Medicine

## 2017-04-30 ENCOUNTER — Other Ambulatory Visit (INDEPENDENT_AMBULATORY_CARE_PROVIDER_SITE_OTHER): Payer: Medicare Other

## 2017-04-30 DIAGNOSIS — E876 Hypokalemia: Secondary | ICD-10-CM | POA: Diagnosis not present

## 2017-04-30 LAB — POTASSIUM: POTASSIUM: 3.8 meq/L (ref 3.5–5.1)

## 2017-05-25 ENCOUNTER — Telehealth: Payer: Self-pay | Admitting: *Deleted

## 2017-05-25 DIAGNOSIS — E7800 Pure hypercholesterolemia, unspecified: Secondary | ICD-10-CM

## 2017-05-25 DIAGNOSIS — I10 Essential (primary) hypertension: Secondary | ICD-10-CM

## 2017-05-25 DIAGNOSIS — E119 Type 2 diabetes mellitus without complications: Secondary | ICD-10-CM

## 2017-05-25 NOTE — Telephone Encounter (Signed)
Orders placed for labs

## 2017-05-25 NOTE — Telephone Encounter (Signed)
Pt has lab appt tomorrow morning (05/26/17). Please place "future" orders.

## 2017-05-26 ENCOUNTER — Other Ambulatory Visit (INDEPENDENT_AMBULATORY_CARE_PROVIDER_SITE_OTHER): Payer: Medicare Other

## 2017-05-26 DIAGNOSIS — I10 Essential (primary) hypertension: Secondary | ICD-10-CM | POA: Diagnosis not present

## 2017-05-26 DIAGNOSIS — E119 Type 2 diabetes mellitus without complications: Secondary | ICD-10-CM

## 2017-05-26 DIAGNOSIS — E7800 Pure hypercholesterolemia, unspecified: Secondary | ICD-10-CM | POA: Diagnosis not present

## 2017-05-26 LAB — HEPATIC FUNCTION PANEL
ALBUMIN: 4.1 g/dL (ref 3.5–5.2)
ALK PHOS: 68 U/L (ref 39–117)
ALT: 21 U/L (ref 0–35)
AST: 18 U/L (ref 0–37)
BILIRUBIN TOTAL: 0.9 mg/dL (ref 0.2–1.2)
Bilirubin, Direct: 0.1 mg/dL (ref 0.0–0.3)
Total Protein: 7.1 g/dL (ref 6.0–8.3)

## 2017-05-26 LAB — LIPID PANEL
Cholesterol: 153 mg/dL (ref 0–200)
HDL: 43.4 mg/dL (ref >39.00)
LDL Cholesterol: 84 mg/dL (ref 0–99)
NONHDL: 109.56
Total CHOL/HDL Ratio: 4
Triglycerides: 127 mg/dL (ref 0.0–149.0)
VLDL: 25.4 mg/dL (ref 0.0–40.0)

## 2017-05-26 LAB — BASIC METABOLIC PANEL
BUN: 21 mg/dL (ref 6–23)
CALCIUM: 10 mg/dL (ref 8.4–10.5)
CO2: 30 mEq/L (ref 19–32)
Chloride: 100 mEq/L (ref 96–112)
Creatinine, Ser: 1.2 mg/dL (ref 0.40–1.20)
GFR: 46.14 mL/min — AB (ref >60.00)
GLUCOSE: 145 mg/dL — AB (ref 70–99)
POTASSIUM: 3.9 meq/L (ref 3.5–5.1)
SODIUM: 138 meq/L (ref 135–145)

## 2017-05-26 LAB — TSH: TSH: 4.04 u[IU]/mL (ref 0.35–4.50)

## 2017-05-26 LAB — HEMOGLOBIN A1C: HEMOGLOBIN A1C: 7.5 % — AB (ref 4.6–6.5)

## 2017-05-26 NOTE — Addendum Note (Signed)
Addended by: Leeanne Rio on: 05/26/2017 10:18 AM   Modules accepted: Orders

## 2017-05-27 ENCOUNTER — Ambulatory Visit: Payer: Medicare Other | Admitting: Internal Medicine

## 2017-05-27 ENCOUNTER — Encounter: Payer: Self-pay | Admitting: Internal Medicine

## 2017-05-27 DIAGNOSIS — E7800 Pure hypercholesterolemia, unspecified: Secondary | ICD-10-CM | POA: Diagnosis not present

## 2017-05-27 DIAGNOSIS — R7989 Other specified abnormal findings of blood chemistry: Secondary | ICD-10-CM

## 2017-05-27 DIAGNOSIS — E119 Type 2 diabetes mellitus without complications: Secondary | ICD-10-CM

## 2017-05-27 DIAGNOSIS — F439 Reaction to severe stress, unspecified: Secondary | ICD-10-CM

## 2017-05-27 DIAGNOSIS — M5441 Lumbago with sciatica, right side: Secondary | ICD-10-CM | POA: Diagnosis not present

## 2017-05-27 DIAGNOSIS — I10 Essential (primary) hypertension: Secondary | ICD-10-CM

## 2017-05-27 LAB — MICROALBUMIN / CREATININE URINE RATIO
CREATININE, U: 64.3 mg/dL
Microalb Creat Ratio: 1.1 mg/g (ref 0.0–30.0)
Microalb, Ur: <0.7 mg/dL (ref 0.0–1.9)

## 2017-05-27 MED ORDER — TRAMADOL HCL 50 MG PO TABS: 50.0000 mg | ORAL_TABLET | Freq: Every day | ORAL | 1 refills | Status: DC | PRN

## 2017-05-27 NOTE — Patient Instructions (Signed)
Decrease triamterene/hctz to 1/2 tablet per day.

## 2017-05-27 NOTE — Progress Notes (Signed)
Patient ID: Carrie Mills, female   DOB: 08-17-1940, 77 y.o.   MRN: [id number]   Subjective:    Patient ID: Carrie Mills, female    DOB: 03-31-1940, 77 y.o.   MRN: [id number]  HPI  Patient here for a scheduled follow up.  She reports increased stress with her husband's health issues.  Discussed with her today.  Overall she feels she is doing relatively well.  Staying active.  No chest pain.  No sob. No acid reflux.  No abdominal pain.  Bowels moving.  She is s/p lumbar laminectomy.  Going to therapy.  Was taking lyrica and tramadol.  Would like to continue on tramadol one per day.  States this controls her pain. Does not take any other pain medications.  a1c elevated 7.5.  Discussed with her today.  Discussed additional medication.  She declines.  Wants to work on diet and exercise.     Past Medical History:  Diagnosis Date  . Allergy   . Anxiety   . Cancer ([place])    Basal Cell Skin Cancer  . Colon polyps   . DDD (degenerative disc disease), lumbar   . Hyperglycemia   . Hyperlipidemia   . Hypertension   . Phlebitis 1959   Left Leg  . Varicose veins    Past Surgical History:  Procedure Laterality Date  . ABDOMINAL HYSTERECTOMY  1987  . APPENDECTOMY  1987  . COLECTOMY  2004  . COLONOSCOPY  2009  . COLONOSCOPY  12-27-12   Dr Jamal Collin  . JOINT REPLACEMENT    . LUMBAR LAMINECTOMY/DECOMPRESSION MICRODISCECTOMY N/A 12/14/2016   Procedure: LUMBAR LAMINECTOMY2 LEVELS- L2-3,L4-5;  Surgeon: Bayard Hugger, MD;  Location: [facility] ORS;  Service: Neurosurgery;  Laterality: N/A;  . REPLACEMENT TOTAL KNEE Left 06/05/13  . THROMBECTOMY    . TOTAL ABDOMINAL HYSTERECTOMY W/ BILATERAL SALPINGOOPHORECTOMY  1987   Family History  Problem Relation Age of Onset  . Arthritis Mother   . Heart disease Mother        s/p CABG  . Hypertension Mother   . Heart disease Father   . Hypertension Father   . Diabetes Father   . Breast cancer Neg Hx   . Colon cancer Neg Hx    Social History    Socioeconomic History  . Marital status: Married    Spouse name: None  . Number of children: None  . Years of education: None  . Highest education level: None  Social Needs  . Financial resource strain: None  . Food insecurity - worry: None  . Food insecurity - inability: None  . Transportation needs - medical: None  . Transportation needs - non-medical: None  Occupational History  . None  Tobacco Use  . Smoking status: Never Smoker  . Smokeless tobacco: Never Used  Substance and Sexual Activity  . Alcohol use: No    Alcohol/week: 0.0 oz  . Drug use: No  . Sexual activity: Not Currently  Other Topics Concern  . None  Social History Narrative  . None    Outpatient Encounter Medications as of 05/27/2017  Medication Sig  . ACCU-CHEK SOFTCLIX LANCETS lancets APPLY AS DIRECTED 4 TIMES A DAY  . Blood Glucose Monitoring Suppl (ACCU-CHEK AVIVA PLUS) w/Device KIT USE AS DIRECTED 4 TIMES A DAY  . citalopram (CELEXA) 10 MG tablet TAKE 1 TABLET (10 MG TOTAL) DAILY BY MOUTH.  . fluticasone furoate-vilanterol (BREO ELLIPTA) 200-25 MCG/INH AEPB Inhale 1 puff into the lungs daily.  Marland Kitchen glucose blood (  ACCU-CHEK AVIVA PLUS) test strip TEST BLOOD SUGAR ONCE A DAY OR AS INSTRUCTED. DX 250.0  . hydrochlorothiazide (HYDRODIURIL) 25 MG tablet TAKE 1 TABLET BY MOUTH EVERY DAY **06/27/2016 INS**  . methocarbamol (ROBAXIN) 500 MG tablet Take 500 mg by mouth every 6 (six) hours as needed for muscle spasms.  . metoprolol succinate (TOPROL-XL) 25 MG 24 hr tablet TAKE 1 TABLET BY MOUTH TWICE A DAY  . montelukast (SINGULAIR) 10 MG tablet Take 1 tablet by mouth daily.   . pregabalin (LYRICA) 75 MG capsule Take 75 mg by mouth 2 (two) times daily.  . rosuvastatin (CRESTOR) 10 MG tablet Take 1 tablet (10 mg total) by mouth daily.  . sodium chloride (OCEAN) 0.65 % nasal spray Place 1 spray into the nose daily as needed for congestion.   . traMADol (ULTRAM) 50 MG tablet Take 1 tablet (50 mg total) by mouth  daily as needed for moderate pain or severe pain.  Marland Kitchen triamterene-hydrochlorothiazide (MAXZIDE-25) 37.5-25 MG tablet Take 1 tablet by mouth daily.  . [DISCONTINUED] ACCU-CHEK AVIVA PLUS test strip TEST BLOOD SUGAR ONCE A DAY OR AS INSTRUCTED  . [DISCONTINUED] traMADol (ULTRAM) 50 MG tablet Take 1 tablet (50 mg total) by mouth every 6 (six) hours as needed for moderate pain or severe pain.  . Multiple Vitamin (MULTIVITAMIN) capsule Take 1 capsule by mouth daily.  Marland Kitchen triamcinolone (NASACORT) 55 MCG/ACT nasal inhaler Place 2 sprays into the nose daily.  . [DISCONTINUED] KLOR-CON M10 10 MEQ tablet TAKE 1 TABLET (10 MEQ TOTAL) DAILY BY MOUTH.  . [DISCONTINUED] methocarbamol (ROBAXIN) 500 MG tablet Take 1 tablet (500 mg total) by mouth every 6 (six) hours as needed for muscle spasms.  . [DISCONTINUED] potassium chloride (K-DUR) 10 MEQ tablet Take 1 tablet (10 mEq total) by mouth daily.   No facility-administered encounter medications on file as of 05/27/2017.     Review of Systems  Constitutional: Negative for appetite change and unexpected weight change.  HENT: Negative for congestion and sinus pressure.   Respiratory: Negative for cough, chest tightness and shortness of breath.   Cardiovascular: Negative for chest pain, palpitations and leg swelling.  Gastrointestinal: Negative for abdominal pain, diarrhea, nausea and vomiting.  Genitourinary: Negative for difficulty urinating and dysuria.  Musculoskeletal: Negative for joint swelling and myalgias.  Skin: Negative for color change and rash.  Neurological: Negative for dizziness, light-headedness and headaches.  Psychiatric/Behavioral: Negative for agitation and dysphoric mood.       Objective:     Blood pressure rechecked by me:  120/72  Physical Exam  Constitutional: She appears well-developed and well-nourished. No distress.  HENT:  Nose: Nose normal.  Mouth/Throat: Oropharynx is clear and moist.  Neck: Neck supple. No thyromegaly  present.  Cardiovascular: Normal rate and regular rhythm.  Pulmonary/Chest: Breath sounds normal. No respiratory distress. She has no wheezes.  Abdominal: Soft. Bowel sounds are normal. There is no tenderness.  Musculoskeletal: She exhibits no edema or tenderness.  Lymphadenopathy:    She has no cervical adenopathy.  Skin: No rash noted. No erythema.  Psychiatric: She has a normal mood and affect. Her behavior is normal.    BP 134/70 (BP Location: Left Arm, Patient Position: Sitting, Cuff Size: Large)   Pulse 62   Temp 98.1 F (36.7 C) (Oral)   Resp 16   Wt 206 lb 12.8 oz (93.8 kg)   SpO2 98%   BMI 35.50 kg/m  Wt Readings from Last 3 Encounters:  05/27/17 206 lb 12.8 oz (93.8 kg)  12/14/16 211 lb (95.7 kg)  12/02/16 210 lb (95.3 kg)     Lab Results  Component Value Date   WBC 8.9 12/02/2016   HGB 14.3 12/14/2016   HCT 42.0 12/14/2016   PLT 248 12/02/2016   GLUCOSE 145 (H) 05/26/2017   CHOL 153 05/26/2017   TRIG 127.0 05/26/2017   HDL 43.40 05/26/2017   LDLDIRECT 142.0 02/08/2017   LDLCALC 84 05/26/2017   ALT 21 05/26/2017   AST 18 05/26/2017   NA 138 05/26/2017   K 3.9 05/26/2017   CL 100 05/26/2017   CREATININE 1.20 05/26/2017   BUN 21 05/26/2017   CO2 30 05/26/2017   TSH 4.04 05/26/2017   INR 0.90 12/02/2016   HGBA1C 7.5 (H) 05/26/2017   MICROALBUR <0.7 05/27/2017       Assessment & Plan:   Problem List Items Addressed This Visit    Back pain    S/p lumbar laminectomy.  Still some pain.  Request to have tramadol.  Takes one per day. Feels this controls.  Not taking any other pain medication.  Follow.       Relevant Medications   methocarbamol (ROBAXIN) 500 MG tablet   traMADol (ULTRAM) 50 MG tablet   Diabetes ([place])    Discussed recent labs.  Discussed a1c elevation.  Low carb diet and exercise.  She declines to start medication. Wants to work on diet and exercise.  Follow met b and a1c.        Elevated serum creatinine    Elevated creatinine.   Decrease triam/hctz to 1/2 tablet per day.  Stay hydrated.  Follow kidney function.        Relevant Orders   Basic metabolic panel   Hypercholesterolemia    On crestor.  Low cholesterol diet and exercise.  Follow lipid panel and liver function tests.        Hypertension    Blood pressure under good control.  Continue same medication regimen.  Follow pressures.  Follow metabolic panel.        Stress    Increased stress as outlined.  Discussed with her today.  She does not feel needs anything more at this time.  Follow.            Einar Pheasant, MD

## 2017-05-28 ENCOUNTER — Encounter: Payer: Self-pay | Admitting: Internal Medicine

## 2017-05-29 ENCOUNTER — Encounter: Payer: Self-pay | Admitting: Internal Medicine

## 2017-05-29 DIAGNOSIS — R7989 Other specified abnormal findings of blood chemistry: Secondary | ICD-10-CM | POA: Insufficient documentation

## 2017-05-29 NOTE — Assessment & Plan Note (Signed)
Discussed recent labs.  Discussed a1c elevation.  Low carb diet and exercise.  She declines to start medication. Wants to work on diet and exercise.  Follow met b and a1c.

## 2017-05-29 NOTE — Assessment & Plan Note (Signed)
S/p lumbar laminectomy.  Still some pain.  Request to have tramadol.  Takes one per day. Feels this controls.  Not taking any other pain medication.  Follow.

## 2017-05-29 NOTE — Assessment & Plan Note (Signed)
Increased stress as outlined.  Discussed with her today.  She does not feel needs anything more at this time.  Follow.   

## 2017-05-29 NOTE — Assessment & Plan Note (Signed)
Blood pressure under good control.  Continue same medication regimen.  Follow pressures.  Follow metabolic panel.   

## 2017-05-29 NOTE — Assessment & Plan Note (Signed)
Elevated creatinine.  Decrease triam/hctz to 1/2 tablet per day.  Stay hydrated.  Follow kidney function.

## 2017-05-29 NOTE — Assessment & Plan Note (Signed)
On crestor.  Low cholesterol diet and exercise.  Follow lipid panel and liver function tests.   

## 2017-06-17 ENCOUNTER — Other Ambulatory Visit (INDEPENDENT_AMBULATORY_CARE_PROVIDER_SITE_OTHER): Payer: Medicare Other

## 2017-06-17 DIAGNOSIS — R7989 Other specified abnormal findings of blood chemistry: Secondary | ICD-10-CM

## 2017-06-17 LAB — BASIC METABOLIC PANEL
BUN: 20 mg/dL (ref 6–23)
CHLORIDE: 99 meq/L (ref 96–112)
CO2: 29 mEq/L (ref 19–32)
Calcium: 9.8 mg/dL (ref 8.4–10.5)
Creatinine, Ser: 1.23 mg/dL — ABNORMAL HIGH (ref 0.40–1.20)
GFR: 44.84 mL/min — ABNORMAL LOW (ref >60.00)
Glucose, Bld: 138 mg/dL — ABNORMAL HIGH (ref 70–99)
Potassium: 3.7 mEq/L (ref 3.5–5.1)
Sodium: 136 mEq/L (ref 135–145)

## 2017-06-20 ENCOUNTER — Other Ambulatory Visit: Payer: Self-pay | Admitting: Internal Medicine

## 2017-06-20 DIAGNOSIS — R7989 Other specified abnormal findings of blood chemistry: Secondary | ICD-10-CM

## 2017-06-20 NOTE — Progress Notes (Signed)
Order placed for renal ultrasound.  

## 2017-06-22 ENCOUNTER — Encounter (INDEPENDENT_AMBULATORY_CARE_PROVIDER_SITE_OTHER): Payer: Self-pay

## 2017-06-23 ENCOUNTER — Other Ambulatory Visit: Payer: Self-pay | Admitting: Internal Medicine

## 2017-06-25 ENCOUNTER — Ambulatory Visit
Admission: RE | Admit: 2017-06-25 | Discharge: 2017-06-25 | Disposition: A | Discharge status: Home / Self Care | Payer: Medicare Other | Source: Ambulatory Visit | Attending: Internal Medicine | Admitting: Internal Medicine

## 2017-06-25 DIAGNOSIS — N183 Chronic kidney disease, stage 3 (moderate): Secondary | ICD-10-CM | POA: Diagnosis not present

## 2017-06-25 DIAGNOSIS — R7989 Other specified abnormal findings of blood chemistry: Secondary | ICD-10-CM | POA: Diagnosis not present

## 2017-06-25 DIAGNOSIS — E1122 Type 2 diabetes mellitus with diabetic chronic kidney disease: Secondary | ICD-10-CM | POA: Diagnosis not present

## 2017-06-28 ENCOUNTER — Other Ambulatory Visit: Payer: Self-pay | Admitting: Internal Medicine

## 2017-06-28 DIAGNOSIS — R932 Abnormal findings on diagnostic imaging of liver and biliary tract: Secondary | ICD-10-CM

## 2017-06-28 NOTE — Progress Notes (Signed)
Order placed for GI referral.   

## 2017-07-11 ENCOUNTER — Other Ambulatory Visit: Payer: Self-pay | Admitting: Family Medicine

## 2017-07-11 ENCOUNTER — Other Ambulatory Visit: Payer: Self-pay | Admitting: Internal Medicine

## 2017-07-13 ENCOUNTER — Ambulatory Visit (INDEPENDENT_AMBULATORY_CARE_PROVIDER_SITE_OTHER): Payer: Medicare Other

## 2017-07-13 VITALS — BP 122/74 | HR 62 | Temp 98.6°F | Resp 14 | Ht 64.5 in | Wt 209.0 lb

## 2017-07-13 DIAGNOSIS — Z0000 Encounter for general adult medical examination without abnormal findings: Secondary | ICD-10-CM

## 2017-07-13 NOTE — Progress Notes (Signed)
Subjective:   BENNIE SCAFF is a 77 y.o. female who presents for Medicare Annual (Subsequent) preventive examination.  Review of Systems:  No ROS.  Medicare Wellness Visit. Additional risk factors are reflected in the social history. Cardiac Risk Factors include: advanced age (>3mn, >>27women);diabetes mellitus;obesity (BMI >30kg/m2)     Objective:     Vitals: BP 122/74 (BP Location: Left Arm, Patient Position: Sitting, Cuff Size: Normal)   Pulse 62   Temp 98.6 F (37 C) (Oral)   Resp 14   Ht 5' 4.5" (1.638 m)   Wt 209 lb (94.8 kg)   SpO2 95%   BMI 35.32 kg/m   Body mass index is 35.32 kg/m.  Advanced Directives 07/13/2017 12/14/2016 12/14/2016 12/02/2016 01/23/2016  Does Patient Have a Medical Advance Directive? Yes Yes Yes Yes Yes  Type of AParamedicof [place]Living will Healthcare Power of [place]Living will [place] will  Does patient want to make changes to medical advance directive? No - Patient declined No - Patient declined No - Patient declined No - Patient declined -  Copy of [place] Chart? No - copy requested No - copy requested - - No - copy requested    Tobacco Social History   Tobacco Use  Smoking Status Never Smoker  Smokeless Tobacco Never Used     Counseling given: Not Answered   Clinical Intake:  Pre-visit preparation completed: Yes  Pain : No/denies pain     Nutritional Status: BMI > 30  Obese Diabetes: Yes(Followed by PCP)  How often do you need to have someone help you when you read instructions, pamphlets, or other written materials from your doctor or pharmacy?: 1 - Never  Interpreter Needed?: No     Past Medical History:  Diagnosis Date  . Allergy   . Anxiety   . [place]    Basal Cell Skin Cancer  . Colon polyps   . DDD (degenerative disc disease), lumbar   . Hyperglycemia   . Hyperlipidemia   . Hypertension    . Phlebitis 1959   Left Leg  . Varicose veins    Past Surgical History:  Procedure Laterality Date  . ABDOMINAL HYSTERECTOMY  1987  . APPENDECTOMY  1987  . COLECTOMY  2004  . COLONOSCOPY  2009  . COLONOSCOPY  12-27-12   Dr SJamal Collin . JOINT REPLACEMENT    . LUMBAR LAMINECTOMY/DECOMPRESSION MICRODISCECTOMY N/A 12/14/2016   Procedure: LUMBAR LAMINECTOMY2 LEVELS- L2-3,L4-5;  Surgeon: BBayard Hugger MD;  Location: [facility] ORS;  Service: Neurosurgery;  Laterality: N/A;  . REPLACEMENT TOTAL KNEE Left 06/05/13  . THROMBECTOMY    . TOTAL ABDOMINAL HYSTERECTOMY W/ BILATERAL SALPINGOOPHORECTOMY  1987   Family History  Problem Relation Age of Onset  . Arthritis Mother   . Heart disease Mother        s/p CABG  . Hypertension Mother   . Heart disease Father   . Hypertension Father   . Diabetes Father   . Breast cancer Neg Hx   . Colon cancer Neg Hx    Social History   Socioeconomic History  . Marital status: Married    Spouse name: Not on file  . Number of children: Not on file  . Years of education: Not on file  . Highest education level: Not on file  Occupational History  . Not on file  Social Needs  . Financial resource strain: Not hard at  all  . Food insecurity:    Worry: Never true    Inability: Never true  . Transportation needs:    Medical: No    Non-medical: No  Tobacco Use  . Smoking status: Never Smoker  . Smokeless tobacco: Never Used  Substance and Sexual Activity  . Alcohol use: No    Alcohol/week: 0.0 oz  . Drug use: No  . Sexual activity: Not Currently  Lifestyle  . Physical activity:    Days per week: 7 days    Minutes per session: 20 min  . Stress: Not on file  Relationships  . Social connections:    Talks on phone: Not on file    Gets together: Not on file    Attends religious service: Not on file    Active member of club or organization: Not on file    Attends meetings of clubs or organizations: Not on file    Relationship status: Not on file    Other Topics Concern  . Not on file  Social History Narrative  . Not on file    Outpatient Encounter Medications as of 07/13/2017  Medication Sig  . ACCU-CHEK SOFTCLIX LANCETS lancets APPLY AS DIRECTED 4 TIMES A DAY  . Blood Glucose Monitoring Suppl (ACCU-CHEK AVIVA PLUS) w/Device KIT USE AS DIRECTED 4 TIMES A DAY  . citalopram (CELEXA) 10 MG tablet TAKE 1 TABLET (10 MG TOTAL) DAILY BY MOUTH.  . fluticasone furoate-vilanterol (BREO ELLIPTA) 200-25 MCG/INH AEPB Inhale 1 puff into the lungs daily.  Marland Kitchen glucose blood (ACCU-CHEK AVIVA PLUS) test strip TEST BLOOD SUGAR ONCE A DAY OR AS INSTRUCTED. DX 250.0  . hydrochlorothiazide (HYDRODIURIL) 25 MG tablet TAKE 1 TABLET BY MOUTH EVERY DAY **06/27/2016 INS**  . metoprolol succinate (TOPROL-XL) 25 MG 24 hr tablet TAKE 1 TABLET BY MOUTH TWICE A DAY  . montelukast (SINGULAIR) 10 MG tablet Take 1 tablet by mouth daily.   . rosuvastatin (CRESTOR) 10 MG tablet Take 1 tablet (10 mg total) by mouth daily.  . sodium chloride (OCEAN) 0.65 % nasal spray Place 1 spray into the nose daily as needed for congestion.   . traMADol (ULTRAM) 50 MG tablet Take 1 tablet (50 mg total) by mouth daily as needed for moderate pain or severe pain.  Marland Kitchen triamcinolone (NASACORT) 55 MCG/ACT nasal inhaler Place 2 sprays into the nose daily.  Marland Kitchen triamterene-hydrochlorothiazide (MAXZIDE-25) 37.5-25 MG tablet Take 1 tablet by mouth daily.  . [DISCONTINUED] methocarbamol (ROBAXIN) 500 MG tablet Take 500 mg by mouth every 6 (six) hours as needed for muscle spasms.  . [DISCONTINUED] Multiple Vitamin (MULTIVITAMIN) capsule Take 1 capsule by mouth daily.  . [DISCONTINUED] pregabalin (LYRICA) 75 MG capsule Take 75 mg by mouth 2 (two) times daily.   No facility-administered encounter medications on file as of 07/13/2017.     Activities of Daily Living In your present state of health, do you have any difficulty performing the following activities: 07/13/2017 12/14/2016  Hearing? N -   Vision? N -  Difficulty concentrating or making decisions? N -  Walking or climbing stairs? N -  Comment - -  Dressing or bathing? N -  Doing errands, shopping? N N  Preparing Food and eating ? N -  Using the Toilet? N -  In the past six months, have you accidently leaked urine? N -  Do you have problems with loss of bowel control? N -  Managing your Medications? N -  Managing your Finances? N -  Housekeeping or managing  your Housekeeping? N -  Some recent data might be hidden    Patient Care Team: Einar Pheasant, MD as PCP - General (Internal Medicine) Einar Pheasant, MD (Internal Medicine) Christene Lye, MD (General Surgery) Einar Pheasant, MD (Internal Medicine)    Assessment:   This is a routine wellness examination for Syerra.  The goal of the wellness visit is to assist the patient how to close the gaps in care and create a preventative care plan for the patient.   The roster of all physicians providing medical care to patient is listed in the Snapshot section of the chart.  Osteoporosis risk reviewed.    Safety issues reviewed; Alarm system with smoke and carbon monoxide detectors in the home. No firearms in the home. Wears seatbelts when driving or riding with others. No violence in the home.  They do not have excessive sun exposure.  Discussed the need for sun protection: hats, long sleeves and the use of sunscreen if there is significant sun exposure.  Patient is alert, normal appearance, oriented to person/place/and time.  Correctly identified the president of the [place] and recalls of 3/3 words. Performs simple calculations and can read correct time from watch face. Displays appropriate judgement.  No new identified risk were noted.  No failures at ADL's or IADL's.    BMI- discussed the importance of a healthy diet, water intake and the benefits of aerobic exercise. Educational material provided.   24 hour diet recall: Low carb diet  Dental-  every 6 months.  Eye- Visual acuity not assessed per patient preference since they have regular follow up with the ophthalmologist.  Wears corrective lenses.  Sleep patterns- Sleeps 10 hours at night.  Wakes feeling rested.  TDAP vaccine deferred per patient preference.  Follow up with insurance.  Educational material provided.  Patient Concerns: None at this time. Follow up with PCP as needed.  Exercise Activities and Dietary recommendations Current Exercise Habits: Home exercise routine, Time (Minutes): 15, Frequency (Times/Week): 6, Weekly Exercise (Minutes/Week): 90, Intensity: Moderate  Goals    . Increase physical activity     Walk for exercise  Increase up to 5-6 miles on stationary bike per day       Fall Risk Fall Risk  07/13/2017 11/26/2016 01/23/2016 01/13/2016 09/12/2014  Falls in the past year? No Yes Yes No No  Number falls in past yr: - 2 or more 1 - -  Injury with Fall? - No - - -  Follow up - - Falls prevention discussed;Education provided - -   Depression Screen PHQ 2/9 Scores 07/13/2017 11/26/2016 01/23/2016 01/13/2016  PHQ - 2 Score 0 0 0 0  PHQ- 9 Score - 0 - -     Cognitive Function MMSE - Mini Mental State Exam 07/13/2017 01/23/2016  Orientation to time 5 5  Orientation to Place 5 5  Registration 3 3  Attention/ Calculation 5 5  Recall 3 3  Language- name 2 objects 2 2  Language- repeat 1 1  Language- follow 3 step command 3 3  Language- read & follow direction 1 1  Write a sentence 1 1  Copy design 1 1  Total score 30 30     6CIT Screen 01/23/2016  What Year? 0 points  What month? 0 points  What time? 0 points  Count back from 20 0 points  Months in reverse 0 points    Immunization History  Administered Date(s) Administered  . Influenza, High Dose Seasonal PF 01/13/2016, 12/15/2016  .  Influenza-Unspecified 12/14/2013, 01/02/2015  . Pneumococcal Conjugate-13 12/08/2013  . Pneumococcal Polysaccharide-23 01/10/2015   Screening  Tests Health Maintenance  Topic Date Due  . TETANUS/TDAP  01[birth year]27/1960  . FOOT EXAM  05/21/2017  . INFLUENZA VACCINE  10/14/2017  . MAMMOGRAM  11/17/2017  . OPHTHALMOLOGY EXAM  11/18/2017  . HEMOGLOBIN A1C  11/26/2017  . URINE MICROALBUMIN  05/28/2018  . DEXA SCAN  Completed  . PNA vac Low Risk Adult  Completed      Plan:    End of life planning; Advance aging; Advanced directives discussed. Copy of current HCPOA/Living Will requested.    I have personally reviewed and noted the following in the patient's chart:   . Medical and social history . Use of alcohol, tobacco or illicit drugs  . Current medications and supplements . Functional ability and status . Nutritional status . Physical activity . Advanced directives . List of other physicians . Hospitalizations, surgeries, and ER visits in previous 12 months . Vitals . Screenings to include cognitive, depression, and falls . Referrals and appointments  In addition, I have reviewed and discussed with patient certain preventive protocols, quality metrics, and best practice recommendations. A written personalized care plan for preventive services as well as general preventive health recommendations were provided to patient.     Varney Biles, LPN  [phone]

## 2017-07-13 NOTE — Patient Instructions (Addendum)
  Ms. Recinos , Thank you for taking time to come for your Medicare Wellness Visit. I appreciate your ongoing commitment to your health goals. Please review the following plan we discussed and let me know if I can assist you in the future.   Follow up as needed.    Bring a copy of your [place] and/or Living Will to be scanned into chart.  Have a great day!  These are the goals we discussed: Goals    . Increase physical activity     Walk for exercise  Increase up to 5-6 miles on stationary bike per day       This is a list of the screening recommended for you and due dates:  Health Maintenance  Topic Date Due  . Tetanus Vaccine  01[birth year]27/1960  . Complete foot exam   05/21/2017  . Flu Shot  10/14/2017  . Mammogram  11/17/2017  . Eye exam for diabetics  11/18/2017  . Hemoglobin A1C  11/26/2017  . Urine Protein Check  05/28/2018  . DEXA scan (bone density measurement)  Completed  . Pneumonia vaccines  Completed

## 2017-07-13 NOTE — Progress Notes (Signed)
I have reviewed the above note and agree.  Eric Sonnenberg, M.D.  

## 2017-08-01 ENCOUNTER — Other Ambulatory Visit: Payer: Self-pay | Admitting: Internal Medicine

## 2017-08-02 NOTE — Telephone Encounter (Signed)
Last OV 05/27/17 and last refill for 30 with 1 refill ok to fill?

## 2017-08-03 ENCOUNTER — Encounter: Payer: Self-pay | Admitting: Family Medicine

## 2017-08-03 ENCOUNTER — Ambulatory Visit: Payer: Medicare Other | Admitting: Family Medicine

## 2017-08-03 VITALS — BP 110/64 | HR 69 | Temp 98.2°F | Resp 16 | Wt 203.0 lb

## 2017-08-03 DIAGNOSIS — J302 Other seasonal allergic rhinitis: Secondary | ICD-10-CM | POA: Diagnosis not present

## 2017-08-03 DIAGNOSIS — J209 Acute bronchitis, unspecified: Secondary | ICD-10-CM

## 2017-08-03 MED ORDER — DOXYCYCLINE HYCLATE 100 MG PO TABS: 100.0000 mg | ORAL_TABLET | Freq: Two times a day (BID) | ORAL | 0 refills | Status: DC

## 2017-08-03 MED ORDER — BENZONATATE 100 MG PO CAPS
100.0000 mg | ORAL_CAPSULE | Freq: Three times a day (TID) | ORAL | 0 refills | Status: DC
Start: 2017-08-03 — End: 2017-08-17

## 2017-08-03 NOTE — Progress Notes (Signed)
Patient ID: [name], female   DOB: 06-21-1940, 77 y.o.   MRN: [id number]  PCP: Carrie Pheasant, MD  Subjective:  Carrie Mills is a 77 y.o. year old very pleasant female patient who presents with symptoms including nasal congestion, cough that is productive of thick, green sputum, and chest congestion.  - denies wheezing  -started: one week ago, symptoms are not improving -previous treatments: Zyrtec, Singulair, Breo, and nasacort have been used as prescribed and provided limited benefit.  -sick contacts/travel/risks: denies flu exposure. No recent sick contact exposure however she visits a SNF to see her husband daily  Hx: allergies; sees a allergist for symptoms. No recent antibiotic therapy She is not a smoker No history of asthma per patient  ROS-denies fever, NVD, tooth pain. Denies significant shortness of breath.   Pertinent Past Medical History-  Patient Active Problem List   Diagnosis Date Noted  . Elevated serum creatinine 05/29/2017  . Lumbar radiculopathy 12/14/2016  . Pre-op evaluation 11/28/2016  . Back pain 05/16/2015  . Right arm pain 05/16/2015  . Hemorrhoids 09/14/2014  . Stress 09/14/2014  . Health care maintenance 05/07/2014  . Knee pain, left 12/11/2013  . Asymptomatic varicose veins 11/15/2012  . History of colonic polyps 11/15/2012  . Diabetes ([place]) 01/12/2012  . Hypercholesterolemia 01/12/2012  . Hypertension 01/12/2012  . Environmental allergies 01/12/2012    Medications- reviewed  Current Outpatient Medications  Medication Sig Dispense Refill  . ACCU-CHEK SOFTCLIX LANCETS lancets APPLY AS DIRECTED 4 TIMES A DAY 100 each 0  . Blood Glucose Monitoring Suppl (ACCU-CHEK AVIVA PLUS) w/Device KIT USE AS DIRECTED 4 TIMES A DAY 1 kit 0  . cetirizine (ZYRTEC) 10 MG tablet Take 10 mg by mouth daily.    . citalopram (CELEXA) 10 MG tablet TAKE 1 TABLET (10 MG TOTAL) DAILY BY MOUTH. 90 tablet 0  . fluticasone furoate-vilanterol (BREO ELLIPTA) 200-25  MCG/INH AEPB Inhale 1 puff into the lungs daily.    Marland Kitchen glucose blood (ACCU-CHEK AVIVA PLUS) test strip TEST BLOOD SUGAR ONCE A DAY OR AS INSTRUCTED. DX 250.0 100 each 6  . hydrochlorothiazide (HYDRODIURIL) 25 MG tablet TAKE 1 TABLET BY MOUTH EVERY DAY **06/27/2016 INS** 90 tablet 1  . metoprolol succinate (TOPROL-XL) 25 MG 24 hr tablet TAKE 1 TABLET BY MOUTH TWICE A DAY 180 tablet 0  . montelukast (SINGULAIR) 10 MG tablet Take 1 tablet by mouth daily.     Marland Kitchen PROAIR HFA 108 (90 Base) MCG/ACT inhaler INHALE 1-2 PUFFS BY MOUTH AS NEEDED EVERY 6 HRS  0  . rosuvastatin (CRESTOR) 10 MG tablet Take 1 tablet (10 mg total) by mouth daily. 90 tablet 1  . sodium chloride (OCEAN) 0.65 % nasal spray Place 1 spray into the nose daily as needed for congestion.     . traMADol (ULTRAM) 50 MG tablet TAKE 1 TABLET BY MOUTH DAILY AS NEEDED 30 tablet 1  . triamcinolone (NASACORT) 55 MCG/ACT nasal inhaler Place 2 sprays into the nose daily.    Marland Kitchen triamterene-hydrochlorothiazide (MAXZIDE-25) 37.5-25 MG tablet Take 1 tablet by mouth daily. 90 tablet 3   No current facility-administered medications for this visit.     Objective: BP 110/64 (BP Location: Left Arm, Patient Position: Sitting, Cuff Size: Large)   Pulse 69   Temp 98.2 F (36.8 C) (Oral)   Resp 16   Wt 203 lb (92.1 kg)   SpO2 98%   BMI 34.31 kg/m  Gen: NAD, resting comfortably HEENT: Turbinates mildly erythematous, TMs normal bilaterally,  oropharynx clear and moist, no sinus tenderness CV: RRR no murmurs rubs or gallops Lungs: CTAB no crackles, rhonchi or rales. Very mild scattered wheezing present Ext: no edema Skin: warm, dry, no rash  Assessment/Plan: 1. Acute bronchitis, unspecified organism - doxycycline (VIBRA-TABS) 100 MG tablet; Take 1 tablet (100 mg total) by mouth 2 (two) times daily.  Dispense: 20 tablet; Refill: 0 - benzonatate (TESSALON) 100 MG capsule; Take 1 capsule (100 mg total) by mouth 3 (three) times daily.  Dispense: 20 capsule;  Refill: 0  Symptoms are most consistent with bronchitis. We discussed that symptoms are likely viral nature and less likely bacterial cause. Mild scattered wheezing present; VSS, O2 sat 98% and no report of SOB today is reassuring and make pneumonia unlikely. We reviewed possible treatment options and she has a history of DM and is not interested prednisone and with exam will hold off on this for now. Provided a prescription for doxycycline to take if no improvement in the next 2-3 days. Further advised her to avoid ibuprofen, aleve, or advil due to kidney function. Also opted for doxycycline versus azithromycin due to kidney function. Benzonatate provided for cough. Albuterol if needed.   We discussed that we did not find any infection that had higher probability of being bacterial such as pneumonia, strep throat, ear infection, bacterial sinusitis. We discussed signs that bacterial infection may have developed particularly fever or shortness of breath and reasons for follow up (symptoms worsen, last past expected time frame, new concerns arise).  2. Seasonal allergies Continue nasacort and singulair as prescribed by allergy provider. Follow up with allergist as scheduled.   Laurita Quint, FNP

## 2017-08-03 NOTE — Patient Instructions (Signed)
Please take medication as directed with food. Also, avoid direct and prolonged exposure to sunlight with this medication.  Your symptoms today are most likely caused by viral illness. Please drink plenty of water enough for your urine to be pale yellow or clear. You may use Tylenol 325 mg every 6 hours as needed. Benzonatate can be used for cough. Follow-up for evaluation if your symptoms do not improve in 3-4 days, worsen, or you develop a fever greater than 100.    Acute Bronchitis, Adult Acute bronchitis is when air tubes (bronchi) in the lungs suddenly get swollen. The condition can make it hard to breathe. It can also cause these symptoms:  A cough.  Coughing up clear, yellow, or green mucus.  Wheezing.  Chest congestion.  Shortness of breath.  A fever.  Body aches.  Chills.  A sore throat.  Follow these instructions at home: Medicines  Take over-the-counter and prescription medicines only as told by your doctor.  If you were prescribed an antibiotic medicine, take it as told by your doctor. Do not stop taking the antibiotic even if you start to feel better. General instructions  Rest.  Drink enough fluids to keep your pee (urine) clear or pale yellow.  Avoid smoking and secondhand smoke. If you smoke and you need help quitting, ask your doctor. Quitting will help your lungs heal faster.  Use an inhaler, cool mist vaporizer, or humidifier as told by your doctor.  Keep all follow-up visits as told by your doctor. This is important. How is this prevented? To lower your risk of getting this condition again:  Wash your hands often with soap and water. If you cannot use soap and water, use hand sanitizer.  Avoid contact with people who have cold symptoms.  Try not to touch your hands to your mouth, nose, or eyes.  Make sure to get the flu shot every year.  Contact a doctor if:  Your symptoms do not get better in 2 weeks. Get help right away if:  You cough  up blood.  You have chest pain.  You have very bad shortness of breath.  You become dehydrated.  You faint (pass out) or keep feeling like you are going to pass out.  You keep throwing up (vomiting).  You have a very bad headache.  Your fever or chills gets worse. This information is not intended to replace advice given to you by your health care provider. Make sure you discuss any questions you have with your health care provider. Document Released: 08/19/2007 Document Revised: 10/09/2015 Document Reviewed: 08/21/2015 Elsevier Interactive Patient Education  [employer].

## 2017-08-05 ENCOUNTER — Ambulatory Visit: Payer: Medicare Other | Admitting: Gastroenterology

## 2017-08-17 ENCOUNTER — Ambulatory Visit: Payer: Medicare Other | Admitting: Gastroenterology

## 2017-08-17 ENCOUNTER — Encounter: Payer: Self-pay | Admitting: Gastroenterology

## 2017-08-17 VITALS — BP 123/64 | HR 66 | Ht 64.5 in | Wt 204.0 lb

## 2017-08-17 DIAGNOSIS — K760 Fatty (change of) liver, not elsewhere classified: Secondary | ICD-10-CM

## 2017-08-17 NOTE — Addendum Note (Signed)
Addended by: Peggye Ley on: 08/17/2017 02:57 PM   Modules accepted: Orders

## 2017-08-17 NOTE — Patient Instructions (Signed)
1. Call Central Scheduling to arrange date and time for ultrasound. [phone]  2. Complete labs at [facility] or [place].  3.  Follow-up visit in 3 - 4 months.  4.  Incorporate Mediterranean diet.

## 2017-08-17 NOTE — Progress Notes (Signed)
Jonathon Bellows MD, MRCP(U.K) [address]  [place]  [place], [place] [address]  Main: [phone]  Fax: [phone]   Gastroenterology Consultation  Referring Provider:     Einar Pheasant, MD Primary Care Physician:  Einar Pheasant, MD Primary Gastroenterologist:  Dr. Jonathon Bellows  Reason for Consultation:    Abnormal USG        HPI:   Carrie Mills is a 77 y.o. y/o female referred for consultation & management  by Dr. Nicki Reaper, Randell Patient, MD.    She has been referred for an abnormal ultrasound. She had a renal ultrasound for elevated creatinine and incidentally was noted to have features of fatty infiltration on the liver and hence referred.    LFT's normal in 05/26/17 . CBC in 11/2017 showed normal platelet count.   No prior issues with ;liver in the past , no family history of liver disease. No history of alcohol abuse, not a smoker. She has weighed the most at 217 lbs . Has lost a few lbs recently . Overweight runs in the family. No issues with thyroid. She is borderline diabetic, she has HTN and HLP Past Medical History:  Diagnosis Date  . Allergy   . Anxiety   . Cancer ([place])    Basal Cell Skin Cancer  . Colon polyps   . DDD (degenerative disc disease), lumbar   . Hyperglycemia   . Hyperlipidemia   . Hypertension   . Phlebitis 1959   Left Leg  . Varicose veins     Past Surgical History:  Procedure Laterality Date  . ABDOMINAL HYSTERECTOMY  1987  . APPENDECTOMY  1987  . COLECTOMY  2004  . COLONOSCOPY  2009  . COLONOSCOPY  12-27-12   Dr Jamal Collin  . JOINT REPLACEMENT    . LUMBAR LAMINECTOMY/DECOMPRESSION MICRODISCECTOMY N/A 12/14/2016   Procedure: LUMBAR LAMINECTOMY2 LEVELS- L2-3,L4-5;  Surgeon: Bayard Hugger, MD;  Location: [facility] ORS;  Service: Neurosurgery;  Laterality: N/A;  . REPLACEMENT TOTAL KNEE Left 06/05/13  . THROMBECTOMY    . TOTAL ABDOMINAL HYSTERECTOMY W/ BILATERAL SALPINGOOPHORECTOMY  1987    Prior to Admission medications   Medication Sig  Start Date End Date Taking? Authorizing Provider  pregabalin (LYRICA) 75 MG capsule TAKE 1 CAPSULE BY MOUTH TWICE A DAY 03/19/17  Yes [provider]  ACCU-CHEK SOFTCLIX LANCETS lancets APPLY AS DIRECTED 4 TIMES A DAY 12/31/16   Einar Pheasant, MD  benzonatate (TESSALON) 100 MG capsule Take 1 capsule (100 mg total) by mouth 3 (three) times daily. 08/03/17   Delano Metz, FNP  Blood Glucose Monitoring Suppl (ACCU-CHEK AVIVA PLUS) w/Device KIT USE AS DIRECTED 4 TIMES A DAY 11/18/16   Einar Pheasant, MD  cetirizine (ZYRTEC) 10 MG tablet Take 10 mg by mouth daily.    [provider]  citalopram (CELEXA) 10 MG tablet TAKE 1 TABLET (10 MG TOTAL) DAILY BY MOUTH. 07/12/17   Einar Pheasant, MD  Coconut Oil 1000 MG CAPS 1,000 mg. Take 1 capsule by mouth once daily.    [provider]  Coenzyme Q10 (COQ10 PO) 100 mg. Take 1 capsule by mouth once a day    [provider]  doxycycline (VIBRA-TABS) 100 MG tablet Take 1 tablet (100 mg total) by mouth 2 (two) times daily. 08/03/17   Kordsmeier, Gregary Signs, FNP  fluticasone (FLONASE) 50 MCG/ACT nasal spray INSTILL 1-2 SPRAYS IN EACH NOSTRIL EVERY DAY 07/28/17   [provider]  fluticasone furoate-vilanterol (BREO ELLIPTA) 200-25 MCG/INH AEPB Inhale 1 puff into  the lungs daily.    [provider]  glucose blood (ACCU-CHEK AVIVA PLUS) test strip TEST BLOOD SUGAR ONCE A DAY OR AS INSTRUCTED. DX 250.0 10/21/16   Einar Pheasant, MD  hydrochlorothiazide (HYDRODIURIL) 25 MG tablet TAKE 1 TABLET BY MOUTH EVERY DAY **06/27/2016 INS** 01/21/17   Einar Pheasant, MD  KLOR-CON M10 10 MEQ tablet  06/16/17   [provider]  metoprolol succinate (TOPROL-XL) 25 MG 24 hr tablet TAKE 1 TABLET BY MOUTH TWICE A DAY 07/12/17   Einar Pheasant, MD  montelukast (SINGULAIR) 10 MG tablet Take 1 tablet by mouth daily.     [provider]  Multiple Vitamin (MULTI-VITAMINS) TABS Take by mouth.    [provider]  PROAIR  HFA 108 (90 Base) MCG/ACT inhaler INHALE 1-2 PUFFS BY MOUTH AS NEEDED EVERY 6 HRS 07/15/17   [provider]  RESVERATROL PO 500 mg. Take 1 capsule by mouth once a day.    [provider]  rosuvastatin (CRESTOR) 10 MG tablet Take 1 tablet (10 mg total) by mouth daily. 03/15/17   Einar Pheasant, MD  sodium chloride (OCEAN) 0.65 % nasal spray Place 1 spray into the nose daily as needed for congestion.     [provider]  traMADol (ULTRAM) 50 MG tablet TAKE 1 TABLET BY MOUTH DAILY AS NEEDED 08/03/17   Einar Pheasant, MD  triamcinolone (NASACORT) 55 MCG/ACT nasal inhaler Place 2 sprays into the nose daily.    [provider]  triamterene-hydrochlorothiazide (MAXZIDE-25) 37.5-25 MG tablet Take 1 tablet by mouth daily. 03/26/17   Einar Pheasant, MD    Family History  Problem Relation Age of Onset  . Arthritis Mother   . Heart disease Mother        s/p CABG  . Hypertension Mother   . Heart disease Father   . Hypertension Father   . Diabetes Father   . Breast cancer Neg Hx   . Colon cancer Neg Hx      Social History   Tobacco Use  . Smoking status: Never Smoker  . Smokeless tobacco: Never Used  Substance Use Topics  . Alcohol use: No    Alcohol/week: 0.0 oz  . Drug use: No    Allergies as of 08/17/2017 - Review Complete 08/03/2017  Allergen Reaction Noted  . Penicillins Hives and Other (See Comments) 08/09/2013  . Sulfa antibiotics Hives and Other (See Comments) 01/11/2012  . Oxycodone Other (See Comments) 12/02/2016  . Astemizole Other (See Comments) 08/09/2013  . Phenylpropanolamine Other (See Comments) 08/09/2013  . Prednisone Other (See Comments) 08/09/2013    Review of Systems:    All systems reviewed and negative except where noted in HPI.   Physical Exam:  There were no vitals taken for this visit. No LMP recorded. Patient has had a hysterectomy. Psych:  Alert and cooperative. Normal mood and affect. General:   Alert,   Well-developed, well-nourished, pleasant and cooperative in NAD Head:  Normocephalic and atraumatic. Eyes:  Sclera clear, no icterus.   Conjunctiva pink. Ears:  Normal auditory acuity. Nose:  No deformity, discharge, or lesions. Mouth:  No deformity or lesions,oropharynx pink & moist. Neck:  Supple; no masses or thyromegaly. Lungs:  Respirations even and unlabored.  Clear throughout to auscultation.   No wheezes, crackles, or rhonchi. No acute distress. Heart:  Regular rate and rhythm; no murmurs, clicks, rubs, or gallops. Abdomen:  Normal bowel sounds.  No bruits.  Soft, non-tender and non-distended without masses, hepatosplenomegaly or hernias noted.  No guarding or rebound tenderness.    Neurologic:  Alert and oriented x3;  grossly normal neurologically. Skin:  Intact without significant lesions or rashes. No jaundice. Lymph Nodes:  No significant cervical adenopathy. Psych:  Alert and cooperative. Normal mood and affect.  Imaging Studies: No results found.  Assessment and Plan:   Carrie Mills is a 77 y.o. y/o female has been referred for an abnormal USG of the liver. Concerns for fatty infiltration. Biochemical she has no features of portal hypertension/cirrhosis with a normal albumin , transaminases and platelet count. Asymptomatic  Plan  1. USG abdomen to look for features of portal hypertension  2. Screen for Hep B/C/ A immune status.  3. NAFLD patient information  4. Discussed life style changes and controlling cardiovascular risk factors, healthy eating , provided patient information on mediterranean diet.      Follow up in 3-4 months  Dr Jonathon Bellows MD,MRCP(U.K)

## 2017-08-18 LAB — HEPATITIS A ANTIBODY, TOTAL: HEP A TOTAL AB: NEGATIVE

## 2017-08-18 LAB — HEPATITIS B SURFACE ANTIGEN: Hepatitis B Surface Ag: NEGATIVE

## 2017-08-18 LAB — HEPATITIS B SURFACE ANTIBODY,QUALITATIVE: HEP B SURFACE AB, QUAL: NONREACTIVE

## 2017-08-18 LAB — HEPATITIS C ANTIBODY

## 2017-08-19 ENCOUNTER — Telehealth: Payer: Self-pay

## 2017-08-19 ENCOUNTER — Encounter: Payer: Self-pay | Admitting: Internal Medicine

## 2017-08-19 DIAGNOSIS — K760 Fatty (change of) liver, not elsewhere classified: Secondary | ICD-10-CM | POA: Insufficient documentation

## 2017-08-19 NOTE — Telephone Encounter (Signed)
Copied from [place] [id number]. Topic: Appointment Scheduling - Scheduling Inquiry for Clinic >> Aug 19, 2017  1:23 PM Scherrie Gerlach wrote: Reason for CRM: pt cannot make the appt she had on Mon, July 1, as she will travel weekly to [place] to see her husband in a nursing home.  Will be home on thurs and Fridays only to come to appts. Your first available on one of those days was Thurs 11/18/2017.  Pt wants to make sure this is ok with Dr Nicki Reaper, and wants to know if Dr Nicki Reaper wants any labs if she has to wait until Sept to see her?

## 2017-08-20 NOTE — Telephone Encounter (Signed)
I can see her 09/23/17 - 12:00.  We can schedule fasting labs prior to appt if desires.

## 2017-08-20 NOTE — Telephone Encounter (Signed)
OK for her to wait until Sept to come in for OV?

## 2017-08-23 ENCOUNTER — Telehealth: Payer: Self-pay

## 2017-08-23 NOTE — Telephone Encounter (Signed)
Pt scheduled and scheduled for labs on 7/10 per pt request

## 2017-08-23 NOTE — Telephone Encounter (Signed)
Advised patient of results per Dr. Vicente Males.   - No hep B/C- needs vaccine for Hep A and B with Einar Pheasant, MD   Information has been forwarded to PCP [facility].

## 2017-08-24 ENCOUNTER — Other Ambulatory Visit: Payer: Self-pay | Admitting: Internal Medicine

## 2017-08-24 DIAGNOSIS — I10 Essential (primary) hypertension: Secondary | ICD-10-CM

## 2017-08-24 DIAGNOSIS — K760 Fatty (change of) liver, not elsewhere classified: Secondary | ICD-10-CM

## 2017-08-24 DIAGNOSIS — E119 Type 2 diabetes mellitus without complications: Secondary | ICD-10-CM

## 2017-08-24 DIAGNOSIS — E7800 Pure hypercholesterolemia, unspecified: Secondary | ICD-10-CM

## 2017-08-24 NOTE — Progress Notes (Signed)
Orders placed for labs

## 2017-08-24 NOTE — Telephone Encounter (Signed)
Orders placed for labs

## 2017-08-26 ENCOUNTER — Ambulatory Visit: Payer: Medicare Other

## 2017-08-27 ENCOUNTER — Ambulatory Visit: Payer: Medicare Other

## 2017-09-02 ENCOUNTER — Ambulatory Visit
Admission: RE | Admit: 2017-09-02 | Discharge: 2017-09-02 | Disposition: A | Discharge status: Home / Self Care | Payer: Medicare Other | Source: Ambulatory Visit | Attending: Gastroenterology | Admitting: Gastroenterology

## 2017-09-02 DIAGNOSIS — K760 Fatty (change of) liver, not elsewhere classified: Secondary | ICD-10-CM | POA: Diagnosis not present

## 2017-09-03 ENCOUNTER — Encounter: Payer: Self-pay | Admitting: Gastroenterology

## 2017-09-13 ENCOUNTER — Ambulatory Visit: Payer: Medicare Other | Admitting: Internal Medicine

## 2017-09-17 ENCOUNTER — Other Ambulatory Visit (INDEPENDENT_AMBULATORY_CARE_PROVIDER_SITE_OTHER): Payer: Medicare Other

## 2017-09-17 DIAGNOSIS — E119 Type 2 diabetes mellitus without complications: Secondary | ICD-10-CM

## 2017-09-17 DIAGNOSIS — E7800 Pure hypercholesterolemia, unspecified: Secondary | ICD-10-CM

## 2017-09-17 DIAGNOSIS — I10 Essential (primary) hypertension: Secondary | ICD-10-CM

## 2017-09-17 DIAGNOSIS — K760 Fatty (change of) liver, not elsewhere classified: Secondary | ICD-10-CM | POA: Diagnosis not present

## 2017-09-17 LAB — HEPATIC FUNCTION PANEL
ALT: 21 U/L (ref 0–35)
AST: 20 U/L (ref 0–37)
Albumin: 3.8 g/dL (ref 3.5–5.2)
Alkaline Phosphatase: 56 U/L (ref 39–117)
BILIRUBIN TOTAL: 1 mg/dL (ref 0.2–1.2)
Bilirubin, Direct: 0.2 mg/dL (ref 0.0–0.3)
Total Protein: 6.4 g/dL (ref 6.0–8.3)

## 2017-09-17 LAB — BASIC METABOLIC PANEL
BUN: 16 mg/dL (ref 6–23)
CO2: 29 meq/L (ref 19–32)
Calcium: 9.5 mg/dL (ref 8.4–10.5)
Chloride: 102 mEq/L (ref 96–112)
Creatinine, Ser: 1.21 mg/dL — ABNORMAL HIGH (ref 0.40–1.20)
GFR: 45.66 mL/min — AB (ref >60.00)
GLUCOSE: 131 mg/dL — AB (ref 70–99)
POTASSIUM: 4.2 meq/L (ref 3.5–5.1)
Sodium: 138 mEq/L (ref 135–145)

## 2017-09-17 LAB — LIPID PANEL
CHOL/HDL RATIO: 4
Cholesterol: 155 mg/dL (ref 0–200)
HDL: 41.5 mg/dL (ref >39.00)
LDL CALC: 83 mg/dL (ref 0–99)
NONHDL: 113.26
Triglycerides: 151 mg/dL — ABNORMAL HIGH (ref 0.0–149.0)
VLDL: 30.2 mg/dL (ref 0.0–40.0)

## 2017-09-17 LAB — HEMOGLOBIN A1C: HEMOGLOBIN A1C: 7 % — AB (ref 4.6–6.5)

## 2017-09-18 ENCOUNTER — Other Ambulatory Visit: Payer: Self-pay | Admitting: Internal Medicine

## 2017-09-20 ENCOUNTER — Other Ambulatory Visit: Payer: Self-pay | Admitting: Internal Medicine

## 2017-09-20 DIAGNOSIS — R739 Hyperglycemia, unspecified: Secondary | ICD-10-CM

## 2017-09-20 NOTE — Progress Notes (Signed)
Order placed for f/u labs.  

## 2017-09-22 ENCOUNTER — Other Ambulatory Visit: Payer: Medicare Other

## 2017-09-23 ENCOUNTER — Ambulatory Visit (INDEPENDENT_AMBULATORY_CARE_PROVIDER_SITE_OTHER): Payer: Medicare Other | Admitting: Internal Medicine

## 2017-09-23 ENCOUNTER — Encounter: Payer: Self-pay | Admitting: Internal Medicine

## 2017-09-23 VITALS — BP 118/60 | HR 60 | Temp 98.0°F | Resp 16 | Ht 65.0 in | Wt 204.0 lb

## 2017-09-23 DIAGNOSIS — K760 Fatty (change of) liver, not elsewhere classified: Secondary | ICD-10-CM | POA: Diagnosis not present

## 2017-09-23 DIAGNOSIS — I10 Essential (primary) hypertension: Secondary | ICD-10-CM

## 2017-09-23 DIAGNOSIS — E119 Type 2 diabetes mellitus without complications: Secondary | ICD-10-CM

## 2017-09-23 DIAGNOSIS — E7800 Pure hypercholesterolemia, unspecified: Secondary | ICD-10-CM | POA: Diagnosis not present

## 2017-09-23 DIAGNOSIS — M5441 Lumbago with sciatica, right side: Secondary | ICD-10-CM

## 2017-09-23 DIAGNOSIS — Z1231 Encounter for screening mammogram for malignant neoplasm of breast: Secondary | ICD-10-CM

## 2017-09-23 DIAGNOSIS — I8390 Asymptomatic varicose veins of unspecified lower extremity: Secondary | ICD-10-CM

## 2017-09-23 DIAGNOSIS — Z1239 Encounter for other screening for malignant neoplasm of breast: Secondary | ICD-10-CM

## 2017-09-23 DIAGNOSIS — F439 Reaction to severe stress, unspecified: Secondary | ICD-10-CM

## 2017-09-23 DIAGNOSIS — Z0000 Encounter for general adult medical examination without abnormal findings: Secondary | ICD-10-CM

## 2017-09-23 NOTE — Progress Notes (Addendum)
Patient ID: Carrie Mills, female   DOB: 07-20-1940, 77 y.o.   MRN: [id number]   Subjective:    Patient ID: Carrie Mills, female    DOB: 05-03-1940, 77 y.o.   MRN: [id number]  HPI  Patient here for a scheduled follow up.  She reports increased stress with her husband's health issues.  She is planning to move to [facility].  He is in a nursing facility there.  This will put her close to her family.  Overall she feels she is handling things relatively well.  Trying to stay active.  Is s/u lumbar laminectomy.  Has been followed by neurosurgery.  Pain improved.  No chest pain.  No sob.  No acid reflux.  No abdominal pain.  Bowels moving.  No urine change.  Saw GI.  Fatty liver.  Discussed diet and exercise.     Past Medical History:  Diagnosis Date  . Allergy   . Anxiety   . Cancer ([place])    Basal Cell Skin Cancer  . Colon polyps   . DDD (degenerative disc disease), lumbar   . Hyperglycemia   . Hyperlipidemia   . Hypertension   . Phlebitis 1959   Left Leg  . Varicose veins    Past Surgical History:  Procedure Laterality Date  . ABDOMINAL HYSTERECTOMY  1987  . APPENDECTOMY  1987  . COLECTOMY  2004  . COLONOSCOPY  2009  . COLONOSCOPY  12-27-12   Dr Jamal Collin  . JOINT REPLACEMENT    . LUMBAR LAMINECTOMY/DECOMPRESSION MICRODISCECTOMY N/A 12/14/2016   Procedure: LUMBAR LAMINECTOMY2 LEVELS- L2-3,L4-5;  Surgeon: Bayard Hugger, MD;  Location: [facility] ORS;  Service: Neurosurgery;  Laterality: N/A;  . REPLACEMENT TOTAL KNEE Left 06/05/13  . THROMBECTOMY    . TOTAL ABDOMINAL HYSTERECTOMY W/ BILATERAL SALPINGOOPHORECTOMY  1987   Family History  Problem Relation Age of Onset  . Arthritis Mother   . Heart disease Mother        s/p CABG  . Hypertension Mother   . Heart disease Father   . Hypertension Father   . Diabetes Father   . Breast cancer Neg Hx   . Colon cancer Neg Hx    Social History   Socioeconomic History  . Marital status: Married    Spouse name: Not on file  .  Number of children: Not on file  . Years of education: Not on file  . Highest education level: Not on file  Occupational History  . Not on file  Social Needs  . Financial resource strain: Not hard at all  . Food insecurity:    Worry: Never true    Inability: Never true  . Transportation needs:    Medical: No    Non-medical: No  Tobacco Use  . Smoking status: Never Smoker  . Smokeless tobacco: Never Used  Substance and Sexual Activity  . Alcohol use: No    Alcohol/week: 0.0 oz  . Drug use: No  . Sexual activity: Not Currently  Lifestyle  . Physical activity:    Days per week: 7 days    Minutes per session: 20 min  . Stress: Not on file  Relationships  . Social connections:    Talks on phone: Not on file    Gets together: Not on file    Attends religious service: Not on file    Active member of club or organization: Not on file    Attends meetings of clubs or organizations: Not on file  Relationship status: Not on file  Other Topics Concern  . Not on file  Social History Narrative  . Not on file    Outpatient Encounter Medications as of 09/23/2017  Medication Sig  . ACCU-CHEK SOFTCLIX LANCETS lancets APPLY AS DIRECTED 4 TIMES A DAY  . Blood Glucose Monitoring Suppl (ACCU-CHEK AVIVA PLUS) w/Device KIT USE AS DIRECTED 4 TIMES A DAY  . cetirizine (ZYRTEC) 10 MG tablet Take 10 mg by mouth daily.  . citalopram (CELEXA) 10 MG tablet TAKE 1 TABLET (10 MG TOTAL) DAILY BY MOUTH.  . Coconut Oil 1000 MG CAPS 1,000 mg. Take 1 capsule by mouth once daily.  . Coenzyme Q10 (COQ10 PO) 100 mg. Take 1 capsule by mouth once a day  . doxycycline (VIBRA-TABS) 100 MG tablet Take 1 tablet (100 mg total) by mouth 2 (two) times daily.  . fluticasone (FLONASE) 50 MCG/ACT nasal spray INSTILL 1-2 SPRAYS IN EACH NOSTRIL EVERY DAY  . fluticasone furoate-vilanterol (BREO ELLIPTA) 200-25 MCG/INH AEPB Inhale 1 puff into the lungs daily.  Marland Kitchen glucose blood (ACCU-CHEK AVIVA PLUS) test strip TEST BLOOD  SUGAR ONCE A DAY OR AS INSTRUCTED. DX 250.0  . metoprolol succinate (TOPROL-XL) 25 MG 24 hr tablet TAKE 1 TABLET BY MOUTH TWICE A DAY  . montelukast (SINGULAIR) 10 MG tablet Take 1 tablet by mouth daily.   . Multiple Vitamin (MULTI-VITAMINS) TABS Take by mouth.  . pregabalin (LYRICA) 75 MG capsule TAKE 1 CAPSULE BY MOUTH TWICE A DAY  . PROAIR HFA 108 (90 Base) MCG/ACT inhaler INHALE 1-2 PUFFS BY MOUTH AS NEEDED EVERY 6 HRS  . RESVERATROL PO 500 mg. Take 1 capsule by mouth once a day.  . rosuvastatin (CRESTOR) 10 MG tablet TAKE 1 TABLET BY MOUTH EVERY DAY  . sodium chloride (OCEAN) 0.65 % nasal spray Place 1 spray into the nose daily as needed for congestion.   . traMADol (ULTRAM) 50 MG tablet TAKE 1 TABLET BY MOUTH DAILY AS NEEDED  . triamcinolone (NASACORT) 55 MCG/ACT nasal inhaler Place 2 sprays into the nose daily.  Marland Kitchen triamterene-hydrochlorothiazide (MAXZIDE-25) 37.5-25 MG tablet Take 1 tablet by mouth daily.  . [DISCONTINUED] hydrochlorothiazide (HYDRODIURIL) 25 MG tablet TAKE 1 TABLET BY MOUTH EVERY DAY **06/27/2016 [facility]**  . [DISCONTINUED] KLOR-CON M10 10 MEQ tablet    No facility-administered encounter medications on file as of 09/23/2017.     Review of Systems  Constitutional: Negative for appetite change and unexpected weight change.  HENT: Negative for congestion and sinus pressure.   Respiratory: Negative for cough, chest tightness and shortness of breath.   Cardiovascular: Negative for chest pain, palpitations and leg swelling.  Gastrointestinal: Negative for abdominal pain, diarrhea, nausea and vomiting.  Genitourinary: Negative for difficulty urinating and dysuria.  Musculoskeletal: Negative for joint swelling and myalgias.  Skin: Negative for color change and rash.  Neurological: Negative for dizziness, light-headedness and headaches.  Psychiatric/Behavioral: Negative for agitation and dysphoric mood.       Increased stress as outlined.         Objective:    Physical  Exam  Constitutional: She appears well-developed and well-nourished. No distress.  HENT:  Nose: Nose normal.  Mouth/Throat: Oropharynx is clear and moist.  Neck: Neck supple. No thyromegaly present.  Cardiovascular: Normal rate and regular rhythm.  Pulmonary/Chest: Breath sounds normal. No respiratory distress. She has no wheezes.  Abdominal: Soft. Bowel sounds are normal. There is no tenderness.  Musculoskeletal: She exhibits no edema or tenderness.  Lymphadenopathy:    She has  no cervical adenopathy.  Skin: No rash noted. No erythema.  Psychiatric: She has a normal mood and affect. Her behavior is normal.    BP 118/60 (BP Location: Left Arm, Patient Position: Sitting, Cuff Size: Large)   Pulse 60   Temp 98 F (36.7 C) (Oral)   Resp 16   Ht 5' 5" (1.651 m)   Wt 204 lb (92.5 kg)   SpO2 95%   BMI 33.95 kg/m  Wt Readings from Last 3 Encounters:  09/23/17 204 lb (92.5 kg)  08/17/17 204 lb (92.5 kg)  08/03/17 203 lb (92.1 kg)     Lab Results  Component Value Date   WBC 8.9 12/02/2016   HGB 14.3 12/14/2016   HCT 42.0 12/14/2016   PLT 248 12/02/2016   GLUCOSE 131 (H) 09/17/2017   CHOL 155 09/17/2017   TRIG 151.0 (H) 09/17/2017   HDL 41.50 09/17/2017   LDLDIRECT 142.0 02/08/2017   LDLCALC 83 09/17/2017   ALT 21 09/17/2017   AST 20 09/17/2017   NA 138 09/17/2017   K 4.2 09/17/2017   CL 102 09/17/2017   CREATININE 1.21 (H) 09/17/2017   BUN 16 09/17/2017   CO2 29 09/17/2017   TSH 4.04 05/26/2017   INR 0.90 12/02/2016   HGBA1C 7.0 (H) 09/17/2017   MICROALBUR <0.7 05/27/2017    US Abdomen Complete  Result Date: 09/02/2017 CLINICAL DATA:  Initial evaluation for nonalcoholic fatty liver disease, evaluate for portal hypertension EXAM: ABDOMEN ULTRASOUND COMPLETE COMPARISON:  None available. FINDINGS: Gallbladder: No gallstones or wall thickening visualized. No sonographic Murphy sign noted by sonographer. Common bile duct: Diameter: 4.6 mm Liver: No focal lesion  identified. Liver demonstrates a dense echogenic echotexture, suggesting steatosis. Portal vein is patent on color Doppler imaging with normal direction of blood flow towards the liver. IVC: No abnormality visualized. Pancreas: Visualized portion unremarkable. Spleen: Size and appearance within normal limits. Right Kidney: Length: 10.0 cm. Echogenicity within normal limits. No mass or hydronephrosis visualized. Left Kidney: Length: 10.2 cm. Echogenicity within normal limits. No mass or hydronephrosis visualized. Abdominal aorta: No aneurysm visualized. Other findings: None. IMPRESSION: Hepatic steatosis. No imaging findings to suggest portal hypertension. Electronically Signed   By: Jeannine Boga M.D.   On: 09/02/2017 13:44       Assessment & Plan:   Problem List Items Addressed This Visit    Asymptomatic varicose veins    Wears compression hose.  Stable.  Was followed by Dr Jamal Collin.       Back pain    S/p lumbar laminectomy.  Seeing neurosurgery.  Doing better.        Diabetes ([place])    Low carb diet and exercise.  Follow met b and a1c.  Discussed low carb diet.        Fatty liver disease, nonalcoholic    Saw Dr Vicente Males.  Discussed diet, exercise and weight loss.        Health care maintenance    Physical today 09/23/17.  PAP 05/21/16.  Mammogram 11/17/16 - Birads I.  She will schedule f/u mammogram.  Colonoscopy 06/2012.  Recommend f/u in 5 years.        Hypercholesterolemia    On crestor.  Low cholesterol diet and exercise.  Follow lipid panel and liver function tests.        Relevant Orders   Hepatic function panel   Hypertension    Blood pressure under good control.  Continue same medication regimen.  Follow pressures.  Follow metabolic panel.  Relevant Orders   CBC with Differential/Platelet   Basic metabolic panel   Stress    Increased stress as outlined.  Discussed with her today.  She feels she is handling things relatively well.  Does not feel needs anythimg more  a this time.  Follow.         Other Visit Diagnoses    Breast cancer screening    -  Primary   Relevant Orders   MM 3D SCREEN BREAST BILATERAL       Einar Pheasant, MD

## 2017-09-23 NOTE — Progress Notes (Signed)
Pre-visit discussion using our clinic review tool. No additional management support is needed unless otherwise documented below in the visit note.  

## 2017-09-26 ENCOUNTER — Encounter: Payer: Self-pay | Admitting: Internal Medicine

## 2017-09-26 NOTE — Assessment & Plan Note (Signed)
Physical today 09/23/17.  PAP 05/21/16.  Mammogram 11/17/16 - Birads I.  She will schedule f/u mammogram.  Colonoscopy 06/2012.  Recommend f/u in 5 years.

## 2017-09-26 NOTE — Assessment & Plan Note (Signed)
Increased stress as outlined.  Discussed with her today.  She feels she is handling things relatively well.  Does not feel needs anythimg more a this time.  Follow.

## 2017-09-26 NOTE — Assessment & Plan Note (Signed)
Saw Dr Vicente Males.  Discussed diet, exercise and weight loss.

## 2017-09-26 NOTE — Assessment & Plan Note (Signed)
Wears compression hose.  Stable.  Was followed by Dr Jamal Collin.

## 2017-09-26 NOTE — Assessment & Plan Note (Signed)
Low carb diet and exercise.  Follow met b and a1c.  Discussed low carb diet.

## 2017-09-26 NOTE — Addendum Note (Signed)
Addended by: Alisa Graff on: 09/26/2017 01:35 PM   Modules accepted: Orders, Level of Service

## 2017-09-26 NOTE — Assessment & Plan Note (Signed)
S/p lumbar laminectomy.  Seeing neurosurgery.  Doing better.

## 2017-09-26 NOTE — Assessment & Plan Note (Signed)
Blood pressure under good control.  Continue same medication regimen.  Follow pressures.  Follow metabolic panel.   

## 2017-09-26 NOTE — Assessment & Plan Note (Signed)
On crestor.  Low cholesterol diet and exercise.  Follow lipid panel and liver function tests.   

## 2017-10-01 ENCOUNTER — Other Ambulatory Visit: Payer: Self-pay | Admitting: Internal Medicine

## 2017-10-01 NOTE — Telephone Encounter (Signed)
Called and verified patient has enough medication to last until PCP back in office last fill 6/19 for 30 no refills.

## 2017-10-03 NOTE — Telephone Encounter (Signed)
Please call pt and let her know that since she has had surgery and pain better, I would like to transition her off tramadol given that she is also on citalopram.  Would prefer not to leave on both medication given can be some interaction.  Would prefer her to use tylenol for pain.

## 2017-10-06 ENCOUNTER — Other Ambulatory Visit: Payer: Self-pay | Admitting: Internal Medicine

## 2017-10-07 ENCOUNTER — Other Ambulatory Visit: Payer: Self-pay | Admitting: Internal Medicine

## 2017-10-07 NOTE — Telephone Encounter (Signed)
Last OV 09/23/17 Next OV 11/18/17 Last refill 08/03/17

## 2017-10-07 NOTE — Telephone Encounter (Signed)
See 10/01/17 refill note.  Per note, given pt had surgery and pain better, would like to transition off tramadol if possible.  See previous refill note.

## 2017-10-21 ENCOUNTER — Other Ambulatory Visit (INDEPENDENT_AMBULATORY_CARE_PROVIDER_SITE_OTHER): Payer: Medicare Other

## 2017-10-21 DIAGNOSIS — E7800 Pure hypercholesterolemia, unspecified: Secondary | ICD-10-CM

## 2017-10-21 DIAGNOSIS — R739 Hyperglycemia, unspecified: Secondary | ICD-10-CM | POA: Diagnosis not present

## 2017-10-21 DIAGNOSIS — I10 Essential (primary) hypertension: Secondary | ICD-10-CM

## 2017-10-21 LAB — BASIC METABOLIC PANEL
BUN: 16 mg/dL (ref 6–23)
CO2: 31 mEq/L (ref 19–32)
Calcium: 9.7 mg/dL (ref 8.4–10.5)
Chloride: 102 mEq/L (ref 96–112)
Creatinine, Ser: 1.16 mg/dL (ref 0.40–1.20)
GFR: 47.93 mL/min — ABNORMAL LOW (ref >60.00)
GLUCOSE: 136 mg/dL — AB (ref 70–99)
POTASSIUM: 4.2 meq/L (ref 3.5–5.1)
SODIUM: 137 meq/L (ref 135–145)

## 2017-10-21 LAB — CBC WITH DIFFERENTIAL/PLATELET
BASOS PCT: 1.5 % (ref 0.0–3.0)
Basophils Absolute: 0.1 10*3/uL (ref 0.0–0.1)
EOS PCT: 7.3 % — AB (ref 0.0–5.0)
Eosinophils Absolute: 0.5 10*3/uL (ref 0.0–0.7)
HCT: 37.8 % (ref 36.0–46.0)
Hemoglobin: 12.6 g/dL (ref 12.0–15.0)
LYMPHS ABS: 2 10*3/uL (ref 0.7–4.0)
Lymphocytes Relative: 29.9 % (ref 12.0–46.0)
MCHC: 33.3 g/dL (ref 30.0–36.0)
MCV: 89.9 fl (ref 78.0–100.0)
MONO ABS: 0.6 10*3/uL (ref 0.1–1.0)
Monocytes Relative: 9 % (ref 3.0–12.0)
NEUTROS ABS: 3.5 10*3/uL (ref 1.4–7.7)
NEUTROS PCT: 52.3 % (ref 43.0–77.0)
PLATELETS: 232 10*3/uL (ref 150.0–400.0)
RBC: 4.2 Mil/uL (ref 3.87–5.11)
RDW: 14.1 % (ref 11.5–15.5)
WBC: 6.7 10*3/uL (ref 4.0–10.5)

## 2017-10-21 LAB — HEPATIC FUNCTION PANEL
ALBUMIN: 3.9 g/dL (ref 3.5–5.2)
ALK PHOS: 56 U/L (ref 39–117)
ALT: 21 U/L (ref 0–35)
AST: 21 U/L (ref 0–37)
BILIRUBIN DIRECT: 0.1 mg/dL (ref 0.0–0.3)
BILIRUBIN TOTAL: 0.7 mg/dL (ref 0.2–1.2)
Total Protein: 6.8 g/dL (ref 6.0–8.3)

## 2017-10-21 LAB — HEMOGLOBIN A1C: HEMOGLOBIN A1C: 6.9 % — AB (ref 4.6–6.5)

## 2017-10-22 ENCOUNTER — Encounter: Payer: Self-pay | Admitting: Internal Medicine

## 2017-10-22 LAB — GLUCOSE, FASTING: GLUCOSE, PLASMA: 137 mg/dL — AB (ref 65–99)

## 2017-10-28 ENCOUNTER — Encounter: Payer: Self-pay | Admitting: Internal Medicine

## 2017-10-29 ENCOUNTER — Other Ambulatory Visit: Payer: Self-pay | Admitting: Internal Medicine

## 2017-10-29 NOTE — Telephone Encounter (Signed)
Spoke with patient regarding labs. Also I told patient I would let her know if you knew anyone to recommend and she said if not it is no big deal she just thought she would ask

## 2017-11-16 ENCOUNTER — Encounter: Payer: Self-pay | Admitting: Internal Medicine

## 2017-11-18 ENCOUNTER — Ambulatory Visit: Payer: Medicare Other | Admitting: Internal Medicine

## 2017-12-02 ENCOUNTER — Ambulatory Visit: Payer: Medicare Other | Admitting: General Surgery

## 2018-01-05 ENCOUNTER — Other Ambulatory Visit: Payer: Self-pay | Admitting: Internal Medicine

## 2018-01-09 ENCOUNTER — Other Ambulatory Visit: Payer: Self-pay | Admitting: Internal Medicine

## 2018-01-26 ENCOUNTER — Ambulatory Visit: Payer: Medicare Other | Admitting: Internal Medicine

## 2018-01-26 ENCOUNTER — Encounter: Payer: Self-pay | Admitting: Internal Medicine

## 2018-01-26 DIAGNOSIS — E7800 Pure hypercholesterolemia, unspecified: Secondary | ICD-10-CM

## 2018-01-26 DIAGNOSIS — Z9109 Other allergy status, other than to drugs and biological substances: Secondary | ICD-10-CM

## 2018-01-26 DIAGNOSIS — I10 Essential (primary) hypertension: Secondary | ICD-10-CM

## 2018-01-26 DIAGNOSIS — K760 Fatty (change of) liver, not elsewhere classified: Secondary | ICD-10-CM

## 2018-01-26 DIAGNOSIS — Z86010 Personal history of colonic polyps: Secondary | ICD-10-CM

## 2018-01-26 DIAGNOSIS — R7989 Other specified abnormal findings of blood chemistry: Secondary | ICD-10-CM

## 2018-01-26 DIAGNOSIS — E119 Type 2 diabetes mellitus without complications: Secondary | ICD-10-CM | POA: Diagnosis not present

## 2018-01-26 DIAGNOSIS — F439 Reaction to severe stress, unspecified: Secondary | ICD-10-CM

## 2018-01-26 NOTE — Progress Notes (Signed)
Patient ID: Carrie Mills, female   DOB: 1941/02/02, 77 y.o.   MRN: [id number]   Subjective:    Patient ID: Carrie Mills, female    DOB: May 21, 1940, 77 y.o.   MRN: [id number]  HPI  Patient here for a scheduled follow up.  Her husband passed away recently.  She has moved to be close to her family. No longer living in [place].  Plans to find a physician closer to where she is living.  Handling the stress relatively well.  Has good support with her family.  She is living with her daughter now.  Plans to move into her house in the next 2-3 months.  Tries to stay active.  No chest pain.  No sob.  No acid reflux. No abdominal pain.  Bowels moving.  Some drainage.  Discussed allergy treatment.  Overall she feels she is doing well.  Wanted to hold on labs today.  Sugars have been stable.  Discussed diet and exercise.     Past Medical History:  Diagnosis Date  . Allergy   . Anxiety   . Cancer ([place])    Basal Cell Skin Cancer  . Colon polyps   . DDD (degenerative disc disease), lumbar   . Hyperglycemia   . Hyperlipidemia   . Hypertension   . Phlebitis 1959   Left Leg  . Varicose veins    Past Surgical History:  Procedure Laterality Date  . ABDOMINAL HYSTERECTOMY  1987  . APPENDECTOMY  1987  . COLECTOMY  2004  . COLONOSCOPY  2009  . COLONOSCOPY  12-27-12   Dr Jamal Collin  . JOINT REPLACEMENT    . LUMBAR LAMINECTOMY/DECOMPRESSION MICRODISCECTOMY N/A 12/14/2016   Procedure: LUMBAR LAMINECTOMY2 LEVELS- L2-3,L4-5;  Surgeon: Bayard Hugger, MD;  Location: [facility] ORS;  Service: Neurosurgery;  Laterality: N/A;  . REPLACEMENT TOTAL KNEE Left 06/05/13  . THROMBECTOMY    . TOTAL ABDOMINAL HYSTERECTOMY W/ BILATERAL SALPINGOOPHORECTOMY  1987   Family History  Problem Relation Age of Onset  . Arthritis Mother   . Heart disease Mother        s/p CABG  . Hypertension Mother   . Heart disease Father   . Hypertension Father   . Diabetes Father   . Breast cancer Neg Hx   . Colon cancer Neg Hx     Social History   Socioeconomic History  . Marital status: Married    Spouse name: Not on file  . Number of children: Not on file  . Years of education: Not on file  . Highest education level: Not on file  Occupational History  . Not on file  Social Needs  . Financial resource strain: Not hard at all  . Food insecurity:    Worry: Never true    Inability: Never true  . Transportation needs:    Medical: No    Non-medical: No  Tobacco Use  . Smoking status: Never Smoker  . Smokeless tobacco: Never Used  Substance and Sexual Activity  . Alcohol use: No    Alcohol/week: 0.0 standard drinks  . Drug use: No  . Sexual activity: Not Currently  Lifestyle  . Physical activity:    Days per week: 7 days    Minutes per session: 20 min  . Stress: Not on file  Relationships  . Social connections:    Talks on phone: Not on file    Gets together: Not on file    Attends religious service: Not on file    Active  member of club or organization: Not on file    Attends meetings of clubs or organizations: Not on file    Relationship status: Not on file  Other Topics Concern  . Not on file  Social History Narrative  . Not on file    Outpatient Encounter Medications as of 01/26/2018  Medication Sig  . ACCU-CHEK AVIVA PLUS test strip TEST BLOOD SUGAR ONCE A DAY OR AS INSTRUCTED. DX 250.0  . ACCU-CHEK SOFTCLIX LANCETS lancets APPLY AS DIRECTED 4 TIMES A DAY  . Blood Glucose Monitoring Suppl (ACCU-CHEK AVIVA PLUS) w/Device KIT USE AS DIRECTED 4 TIMES A DAY  . cetirizine (ZYRTEC) 10 MG tablet Take 10 mg by mouth daily.  . citalopram (CELEXA) 10 MG tablet TAKE 1 TABLET (10 MG TOTAL) DAILY BY MOUTH.  . Coconut Oil 1000 MG CAPS 1,000 mg. Take 1 capsule by mouth once daily.  . Coenzyme Q10 (COQ10 PO) 100 mg. Take 1 capsule by mouth once a day  . fluticasone (FLONASE) 50 MCG/ACT nasal spray INSTILL 1-2 SPRAYS IN EACH NOSTRIL EVERY DAY  . fluticasone furoate-vilanterol (BREO ELLIPTA) 200-25  MCG/INH AEPB Inhale 1 puff into the lungs daily.  . metoprolol succinate (TOPROL-XL) 25 MG 24 hr tablet TAKE 1 TABLET BY MOUTH TWICE A DAY  . montelukast (SINGULAIR) 10 MG tablet Take 1 tablet by mouth daily.   . Multiple Vitamin (MULTI-VITAMINS) TABS Take by mouth.  Marland Kitchen PROAIR HFA 108 (90 Base) MCG/ACT inhaler INHALE 1-2 PUFFS BY MOUTH AS NEEDED EVERY 6 HRS  . RESVERATROL PO 500 mg. Take 1 capsule by mouth once a day.  . rosuvastatin (CRESTOR) 10 MG tablet TAKE 1 TABLET BY MOUTH EVERY DAY  . sodium chloride (OCEAN) 0.65 % nasal spray Place 1 spray into the nose daily as needed for congestion.   . triamcinolone (NASACORT) 55 MCG/ACT nasal inhaler Place 2 sprays into the nose daily.  Marland Kitchen triamterene-hydrochlorothiazide (MAXZIDE-25) 37.5-25 MG tablet Take 1 tablet by mouth daily.  . [DISCONTINUED] doxycycline (VIBRA-TABS) 100 MG tablet Take 1 tablet (100 mg total) by mouth 2 (two) times daily.  . [DISCONTINUED] pregabalin (LYRICA) 75 MG capsule TAKE 1 CAPSULE BY MOUTH TWICE A DAY  . [DISCONTINUED] traMADol (ULTRAM) 50 MG tablet TAKE 1 TABLET BY MOUTH DAILY AS NEEDED   No facility-administered encounter medications on file as of 01/26/2018.     Review of Systems  Constitutional: Negative for appetite change and unexpected weight change.  HENT: Positive for postnasal drip. Negative for congestion and sinus pressure.   Respiratory: Negative for cough, chest tightness and shortness of breath.   Cardiovascular: Negative for chest pain, palpitations and leg swelling.  Gastrointestinal: Negative for abdominal pain, diarrhea, nausea and vomiting.  Genitourinary: Negative for difficulty urinating and dysuria.  Musculoskeletal: Negative for joint swelling and myalgias.  Skin: Negative for color change and rash.  Neurological: Negative for dizziness, light-headedness and headaches.  Psychiatric/Behavioral: Negative for agitation and dysphoric mood.       Objective:    Physical Exam  Constitutional:  She appears well-developed and well-nourished. No distress.  HENT:  Nose: Nose normal.  Mouth/Throat: Oropharynx is clear and moist.  Neck: Neck supple. No thyromegaly present.  Cardiovascular: Normal rate and regular rhythm.  Pulmonary/Chest: Breath sounds normal. No respiratory distress. She has no wheezes.  Abdominal: Soft. Bowel sounds are normal. There is no tenderness.  Musculoskeletal: She exhibits no edema or tenderness.  Lymphadenopathy:    She has no cervical adenopathy.  Skin: No rash noted. No erythema.  Psychiatric: She has a normal mood and affect. Her behavior is normal.    BP 120/62 (BP Location: Left Arm, Patient Position: Sitting, Cuff Size: Normal)   Pulse (!) 58   Temp 97.8 F (36.6 C) (Oral)   Resp 16   Wt 205 lb 6.4 oz (93.2 kg)   SpO2 98%   BMI 34.18 kg/m  Wt Readings from Last 3 Encounters:  01/26/18 205 lb 6.4 oz (93.2 kg)  09/23/17 204 lb (92.5 kg)  08/17/17 204 lb (92.5 kg)     Lab Results  Component Value Date   WBC 6.7 10/21/2017   HGB 12.6 10/21/2017   HCT 37.8 10/21/2017   PLT 232.0 10/21/2017   GLUCOSE 136 (H) 10/21/2017   CHOL 155 09/17/2017   TRIG 151.0 (H) 09/17/2017   HDL 41.50 09/17/2017   LDLDIRECT 142.0 02/08/2017   LDLCALC 83 09/17/2017   ALT 21 10/21/2017   AST 21 10/21/2017   NA 137 10/21/2017   K 4.2 10/21/2017   CL 102 10/21/2017   CREATININE 1.16 10/21/2017   BUN 16 10/21/2017   CO2 31 10/21/2017   TSH 4.04 05/26/2017   INR 0.90 12/02/2016   HGBA1C 6.9 (H) 10/21/2017   MICROALBUR <0.7 05/27/2017    US Abdomen Complete  Result Date: 09/02/2017 CLINICAL DATA:  Initial evaluation for nonalcoholic fatty liver disease, evaluate for portal hypertension EXAM: ABDOMEN ULTRASOUND COMPLETE COMPARISON:  None available. FINDINGS: Gallbladder: No gallstones or wall thickening visualized. No sonographic Murphy sign noted by sonographer. Common bile duct: Diameter: 4.6 mm Liver: No focal lesion identified. Liver demonstrates a  dense echogenic echotexture, suggesting steatosis. Portal vein is patent on color Doppler imaging with normal direction of blood flow towards the liver. IVC: No abnormality visualized. Pancreas: Visualized portion unremarkable. Spleen: Size and appearance within normal limits. Right Kidney: Length: 10.0 cm. Echogenicity within normal limits. No mass or hydronephrosis visualized. Left Kidney: Length: 10.2 cm. Echogenicity within normal limits. No mass or hydronephrosis visualized. Abdominal aorta: No aneurysm visualized. Other findings: None. IMPRESSION: Hepatic steatosis. No imaging findings to suggest portal hypertension. Electronically Signed   By: Jeannine Boga M.D.   On: 09/02/2017 13:44       Assessment & Plan:   Problem List Items Addressed This Visit    Diabetes ([place])    Low carb diet and exercise.  Follow met b and a1c.        Elevated serum creatinine    Recent GFR 47.  Only taking 1/2 triam/hctz now.  Stay hydrated.  Renal ultrasound 06/2017 - no renal sonographic abnormalities noted.  Follow renal function.        Environmental allergies    Some drainage.  Has been followed by Dr Donneta Romberg.  Continue singulair, antihistamine and flonase.  Follow.        Fatty liver disease, nonalcoholic    Has seen GI - Dr Vicente Males.  Discussed diet, exercise and weight loss.  Need to make sure has had hepatitis A and B vaccine.        History of colonic polyps    Colonoscopy 12/2012 - normal.  Recommended f/u colonoscopy in 5 years.  Due f/u.  Will need to have f/u colonoscopy.        Hypercholesterolemia    On crestor.  Low cholesterol diet and exercise.  Follow lipid panel and liver function tests.        Hypertension    Blood pressure under good control.  Continue same medication regimen.  Follow pressures.  Follow metabolic panel.        Stress    Increased stress as outlined.  Discussed with her today.  She feels she is handling things well.  Has good support.  Closer to her family  now.  Follow.            Einar Pheasant, MD

## 2018-01-29 ENCOUNTER — Encounter: Payer: Self-pay | Admitting: Internal Medicine

## 2018-01-29 NOTE — Assessment & Plan Note (Signed)
Has seen GI - Dr Vicente Males.  Discussed diet, exercise and weight loss.  Need to make sure has had hepatitis A and B vaccine.

## 2018-01-29 NOTE — Assessment & Plan Note (Signed)
Colonoscopy 12/2012 - normal.  Recommended f/u colonoscopy in 5 years.  Due f/u.  Will need to have f/u colonoscopy.

## 2018-01-29 NOTE — Assessment & Plan Note (Signed)
Increased stress as outlined.  Discussed with her today.  She feels she is handling things well.  Has good support.  Closer to her family now.  Follow.

## 2018-01-29 NOTE — Assessment & Plan Note (Signed)
Blood pressure under good control.  Continue same medication regimen.  Follow pressures.  Follow metabolic panel.   

## 2018-01-29 NOTE — Assessment & Plan Note (Signed)
Low carb diet and exercise.  Follow met b and a1c.   

## 2018-01-29 NOTE — Assessment & Plan Note (Signed)
On crestor.  Low cholesterol diet and exercise.  Follow lipid panel and liver function tests.   

## 2018-01-29 NOTE — Assessment & Plan Note (Signed)
Recent GFR 47.  Only taking 1/2 triam/hctz now.  Stay hydrated.  Renal ultrasound 06/2017 - no renal sonographic abnormalities noted.  Follow renal function.

## 2018-01-29 NOTE — Assessment & Plan Note (Signed)
Some drainage.  Has been followed by Dr Donneta Romberg.  Continue singulair, antihistamine and flonase.  Follow.

## 2018-01-31 ENCOUNTER — Other Ambulatory Visit: Payer: Self-pay | Admitting: Internal Medicine

## 2018-03-13 ENCOUNTER — Other Ambulatory Visit: Payer: Self-pay | Admitting: Internal Medicine

## 2018-03-14 ENCOUNTER — Other Ambulatory Visit: Payer: Self-pay | Admitting: Internal Medicine

## 2018-04-05 ENCOUNTER — Ambulatory Visit: Payer: Medicare Other | Admitting: General Surgery

## 2018-04-27 ENCOUNTER — Other Ambulatory Visit: Payer: Self-pay | Admitting: Internal Medicine

## 2018-07-18 ENCOUNTER — Ambulatory Visit: Payer: Medicare Other | Admitting: Internal Medicine

## 2018-07-18 ENCOUNTER — Ambulatory Visit: Payer: Medicare Other

## 2018-07-20 ENCOUNTER — Other Ambulatory Visit: Payer: Self-pay | Admitting: Internal Medicine

## 2018-07-21 ENCOUNTER — Other Ambulatory Visit: Payer: Self-pay | Admitting: Internal Medicine

## 2018-09-09 ENCOUNTER — Other Ambulatory Visit: Payer: Self-pay | Admitting: Internal Medicine

## 2018-10-16 ENCOUNTER — Other Ambulatory Visit: Payer: Self-pay | Admitting: Internal Medicine

## 2018-10-19 ENCOUNTER — Other Ambulatory Visit: Payer: Self-pay | Admitting: Internal Medicine

## 2018-12-17 IMAGING — MR MR LUMBAR SPINE W/O CM
4 of 5 series · 15 of 48 positions shown · non-contrast
Comparison: None.

CLINICAL DATA: Chronic low back pain with sciatica.

EXAM:
MRI LUMBAR SPINE WITHOUT CONTRAST
TECHNIQUE: Multiplanar, multisequence MR imaging of the lumbar spine was
performed. No intravenous contrast was administered.

[Series 4: T2 · sagittal · 4.0mm · 0.44mm/px · 6 of 15 slices shown (1 of 2)]
[im 1/15]
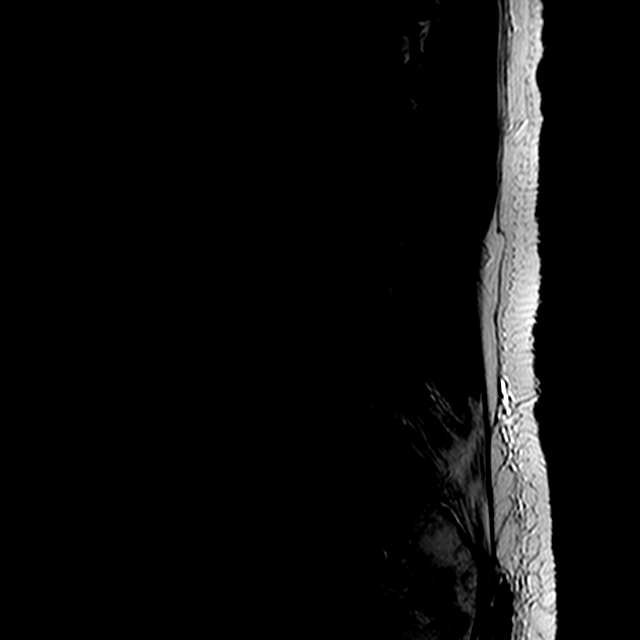
[im 3/15]
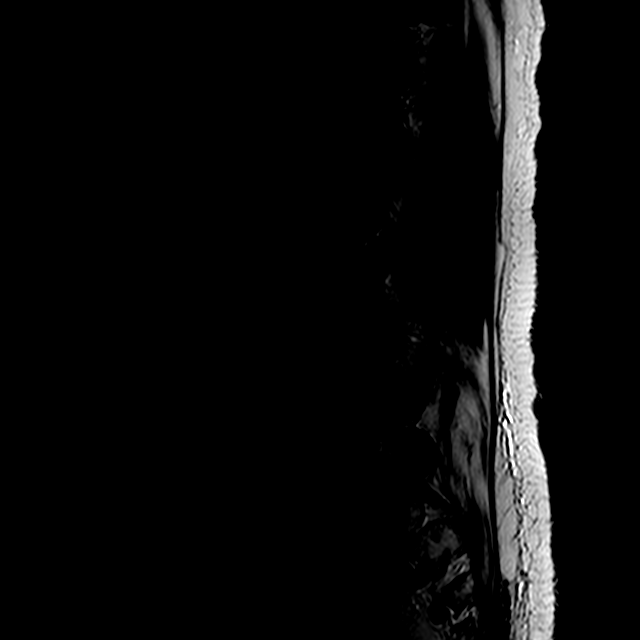
[im 6/15]
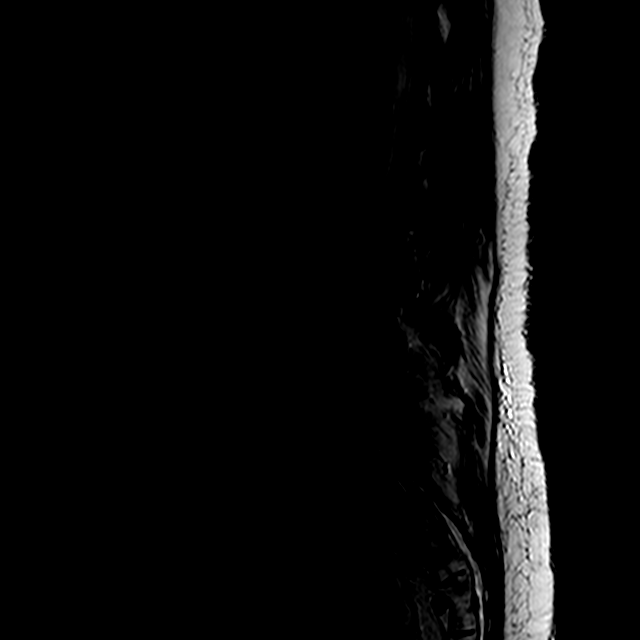
[im 9/15]
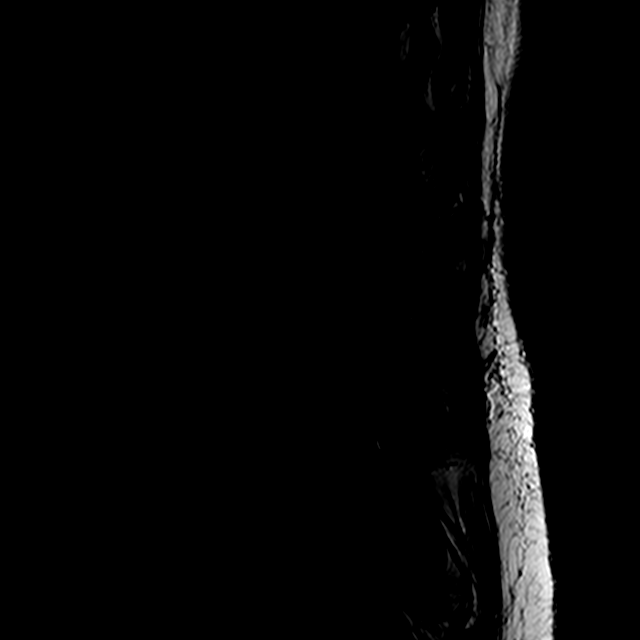
[im 12/15]
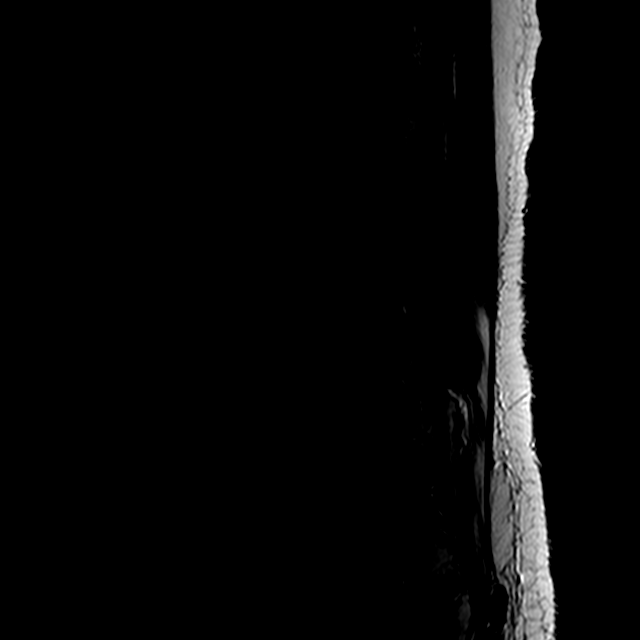
[im 15/15]
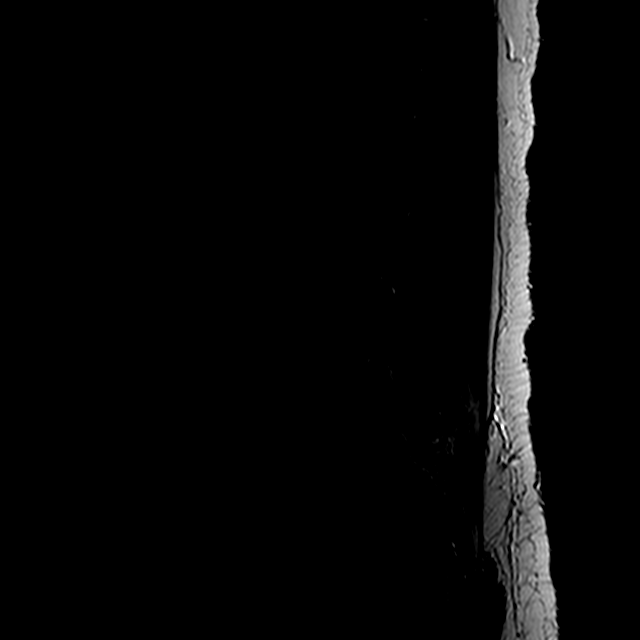

[Series 5: T1 · sagittal · 4.0mm · 0.44mm/px · 3 of 15 slices shown (1 of 2)]
[im 3/15]
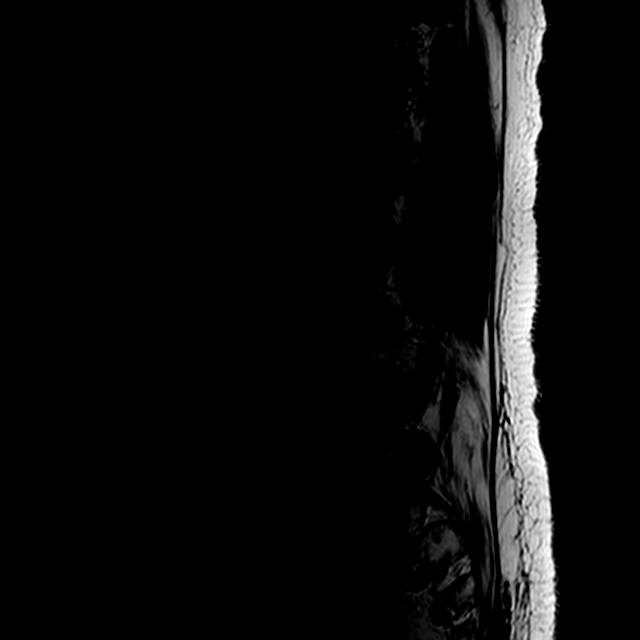
[im 8/15]
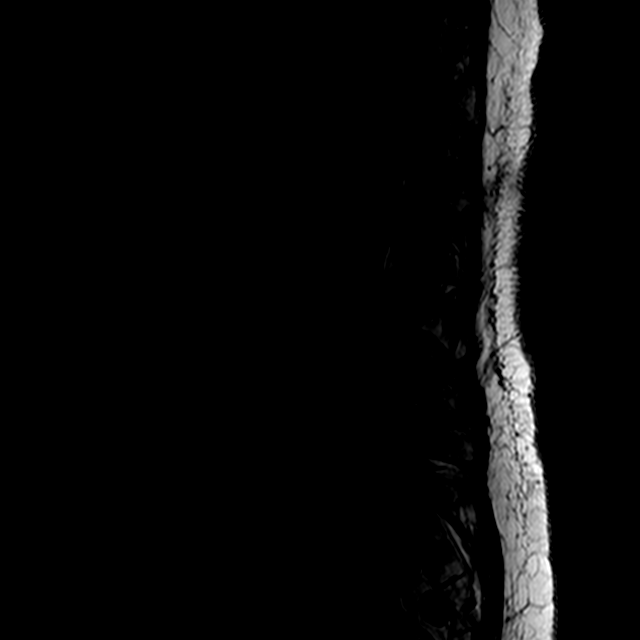
[im 12/15]
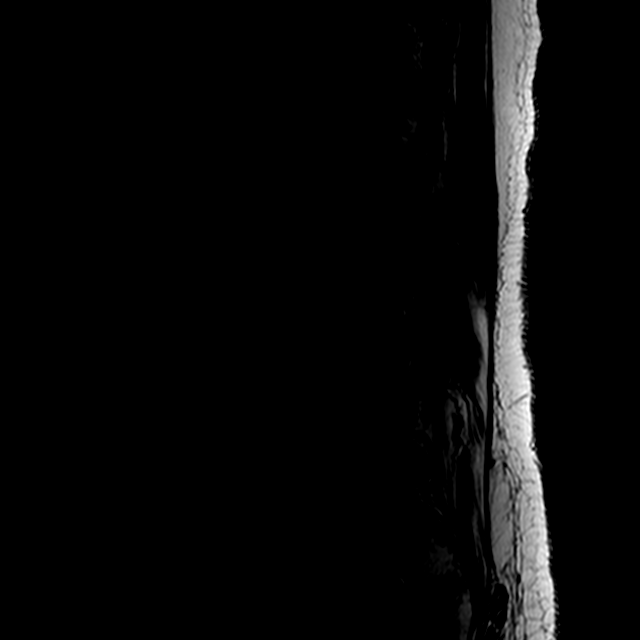

[Series 7: T2 · axial · 4.0mm · 0.39mm/px · z∈[-89,+45]mm · 3 of 30 slices shown (2 of 2)]
[im 5/30]
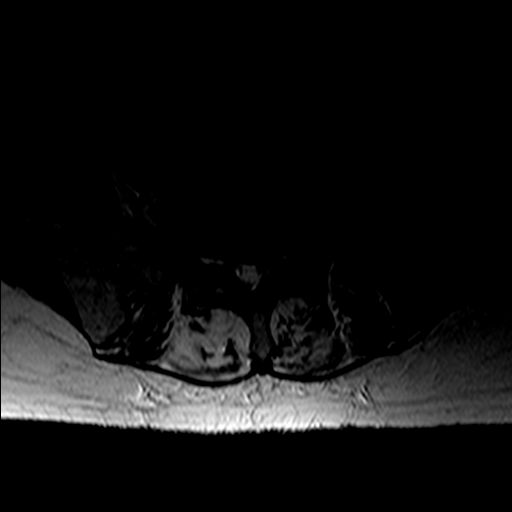
[im 16/30]
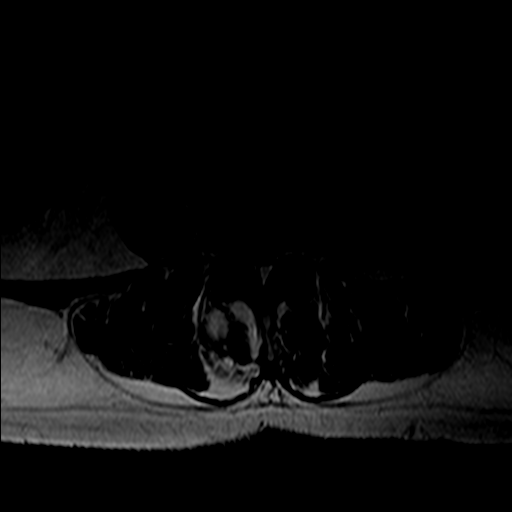
[im 25/30]
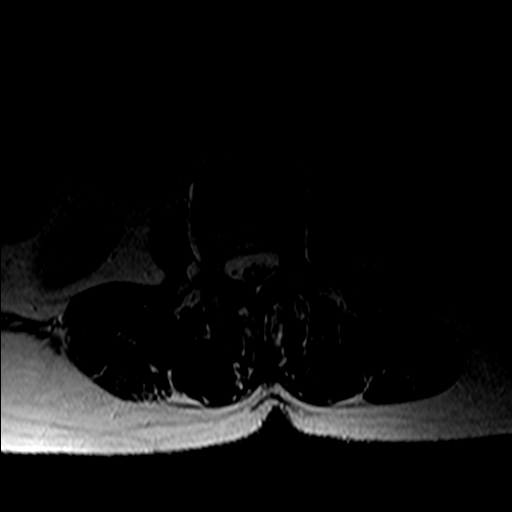

[Series 8: T1 · axial · 4.0mm · 0.39mm/px · z∈[-89,+45]mm · 3 of 30 slices shown (2 of 2)]
[im 5/30]
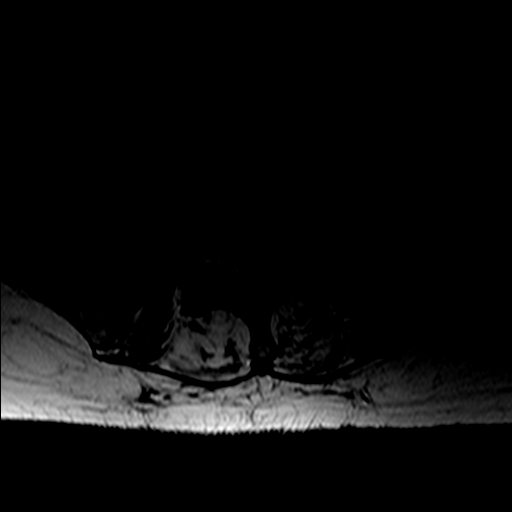
[im 16/30]
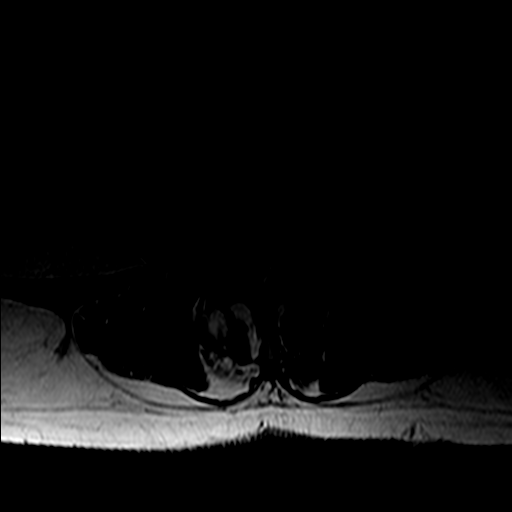
[im 25/30]
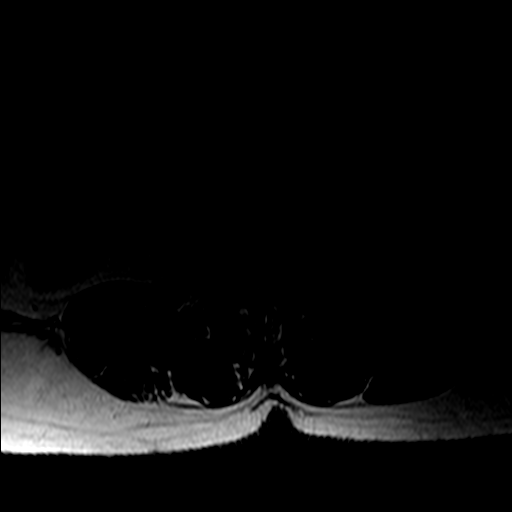

[15 of 48 positions shown; findings below may reference images not displayed]

FINDINGS: Segmentation:  Standard.

Alignment:  Mild facet mediated anterolisthesis at L4-5.

Vertebrae: Mild marrow edema about the right L4-5 facet which is
degenerative appearing. No acute fracture, discitis, or aggressive
bone lesion.

Conus medullaris: Extends to the L1-2 disc level and appears normal.

Paraspinal and other soft tissues: Negative

Disc levels:

T12- L1: Unremarkable.

L1-L2: Mild disc narrowing and bulging.  No impingement

L2-L3: Disc narrowing with asymmetric rightward bulging. Facet
arthropathy with bony and ligamentous hypertrophy. Advanced spinal
stenosis. Mild to moderate biforaminal narrowing.

L3-L4: Degenerative disc narrowing and bulging. Facet and ligament
hypertrophy. Moderate spinal stenosis with near complete effacement
of CSF. Inferior foraminal narrowing worse on the right without L3
compression.

L4-L5: Severe facet arthropathy with mild anterolisthesis. Bulky
spurring and ligament thickening, spurring worse on the right. Disc
bulging that is right eccentric. Severe spinal stenosis. Moderate
foraminal narrowing.

L5-S1:Advanced degenerative disc narrowing. Mild facet spurring.
Subarticular recess narrowing without definite S1 compression.
Foraminal narrowing without L5 compression.
IMPRESSION: 1. L4-5 severe spinal stenosis, primarily related to facet
arthropathy with marked overgrowth and mild anterolisthesis.
2. L2-3 high-grade spinal stenosis.
3. L3-4 moderate spinal stenosis.
4. Mild to moderate bilateral foraminal narrowing from L2-3 to
L5-S1.

## 2019-01-12 ENCOUNTER — Telehealth: Payer: Self-pay | Admitting: *Deleted

## 2019-01-12 NOTE — Telephone Encounter (Signed)
Patient has New provider in [place].

## 2019-01-12 NOTE — Telephone Encounter (Signed)
I am not sure what I need to do with this.  There is no message attached.  FYI she has moved.

## 2019-01-13 ENCOUNTER — Other Ambulatory Visit: Payer: Self-pay | Admitting: Internal Medicine

## 2019-01-17 ENCOUNTER — Other Ambulatory Visit: Payer: Self-pay | Admitting: Internal Medicine

## 2019-03-02 ENCOUNTER — Other Ambulatory Visit: Payer: Self-pay | Admitting: Internal Medicine

## 2019-03-21 DIAGNOSIS — Z1231 Encounter for screening mammogram for malignant neoplasm of breast: Secondary | ICD-10-CM | POA: Diagnosis not present

## 2019-04-02 ENCOUNTER — Other Ambulatory Visit: Payer: Self-pay | Admitting: Internal Medicine

## 2019-04-19 DIAGNOSIS — N183 Chronic kidney disease, stage 3 unspecified: Secondary | ICD-10-CM | POA: Diagnosis not present

## 2019-04-19 DIAGNOSIS — E1122 Type 2 diabetes mellitus with diabetic chronic kidney disease: Secondary | ICD-10-CM | POA: Diagnosis not present

## 2019-04-24 DIAGNOSIS — E785 Hyperlipidemia, unspecified: Secondary | ICD-10-CM | POA: Diagnosis not present

## 2019-04-24 DIAGNOSIS — E1169 Type 2 diabetes mellitus with other specified complication: Secondary | ICD-10-CM | POA: Diagnosis not present

## 2019-04-24 DIAGNOSIS — E1159 Type 2 diabetes mellitus with other circulatory complications: Secondary | ICD-10-CM | POA: Diagnosis not present

## 2019-04-24 DIAGNOSIS — F3342 Major depressive disorder, recurrent, in full remission: Secondary | ICD-10-CM | POA: Diagnosis not present

## 2019-04-24 DIAGNOSIS — N1832 Chronic kidney disease, stage 3b: Secondary | ICD-10-CM | POA: Diagnosis not present

## 2019-04-24 DIAGNOSIS — I129 Hypertensive chronic kidney disease with stage 1 through stage 4 chronic kidney disease, or unspecified chronic kidney disease: Secondary | ICD-10-CM | POA: Diagnosis not present

## 2019-04-24 DIAGNOSIS — E1121 Type 2 diabetes mellitus with diabetic nephropathy: Secondary | ICD-10-CM | POA: Diagnosis not present

## 2019-05-03 DIAGNOSIS — N1832 Chronic kidney disease, stage 3b: Secondary | ICD-10-CM | POA: Diagnosis not present

## 2019-05-03 DIAGNOSIS — E1121 Type 2 diabetes mellitus with diabetic nephropathy: Secondary | ICD-10-CM | POA: Diagnosis not present

## 2019-08-22 DIAGNOSIS — E1159 Type 2 diabetes mellitus with other circulatory complications: Secondary | ICD-10-CM | POA: Diagnosis not present

## 2019-08-22 DIAGNOSIS — E1169 Type 2 diabetes mellitus with other specified complication: Secondary | ICD-10-CM | POA: Diagnosis not present

## 2019-08-22 DIAGNOSIS — E785 Hyperlipidemia, unspecified: Secondary | ICD-10-CM | POA: Diagnosis not present

## 2019-08-22 DIAGNOSIS — N1832 Chronic kidney disease, stage 3b: Secondary | ICD-10-CM | POA: Diagnosis not present

## 2019-08-22 DIAGNOSIS — I152 Hypertension secondary to endocrine disorders: Secondary | ICD-10-CM | POA: Diagnosis not present

## 2019-08-22 DIAGNOSIS — E1122 Type 2 diabetes mellitus with diabetic chronic kidney disease: Secondary | ICD-10-CM | POA: Diagnosis not present

## 2019-08-23 DIAGNOSIS — E1122 Type 2 diabetes mellitus with diabetic chronic kidney disease: Secondary | ICD-10-CM | POA: Diagnosis not present

## 2019-08-23 DIAGNOSIS — E785 Hyperlipidemia, unspecified: Secondary | ICD-10-CM | POA: Diagnosis not present

## 2019-08-23 DIAGNOSIS — I152 Hypertension secondary to endocrine disorders: Secondary | ICD-10-CM | POA: Diagnosis not present

## 2019-08-23 DIAGNOSIS — E1159 Type 2 diabetes mellitus with other circulatory complications: Secondary | ICD-10-CM | POA: Diagnosis not present

## 2019-08-23 DIAGNOSIS — E1169 Type 2 diabetes mellitus with other specified complication: Secondary | ICD-10-CM | POA: Diagnosis not present

## 2019-08-23 DIAGNOSIS — M545 Low back pain: Secondary | ICD-10-CM | POA: Diagnosis not present

## 2019-08-23 DIAGNOSIS — N1832 Chronic kidney disease, stage 3b: Secondary | ICD-10-CM | POA: Diagnosis not present

## 2019-11-29 DIAGNOSIS — F339 Major depressive disorder, recurrent, unspecified: Secondary | ICD-10-CM | POA: Diagnosis not present

## 2019-11-29 DIAGNOSIS — E669 Obesity, unspecified: Secondary | ICD-10-CM | POA: Diagnosis not present

## 2019-11-29 DIAGNOSIS — Z7951 Long term (current) use of inhaled steroids: Secondary | ICD-10-CM | POA: Diagnosis not present

## 2019-11-29 DIAGNOSIS — Z6834 Body mass index (BMI) 34.0-34.9, adult: Secondary | ICD-10-CM | POA: Diagnosis not present

## 2019-11-29 DIAGNOSIS — M1990 Unspecified osteoarthritis, unspecified site: Secondary | ICD-10-CM | POA: Diagnosis not present

## 2019-11-29 DIAGNOSIS — I10 Essential (primary) hypertension: Secondary | ICD-10-CM | POA: Diagnosis not present

## 2019-11-29 DIAGNOSIS — J45909 Unspecified asthma, uncomplicated: Secondary | ICD-10-CM | POA: Diagnosis not present

## 2019-11-29 DIAGNOSIS — G8929 Other chronic pain: Secondary | ICD-10-CM | POA: Diagnosis not present

## 2019-11-29 DIAGNOSIS — F419 Anxiety disorder, unspecified: Secondary | ICD-10-CM | POA: Diagnosis not present

## 2019-12-14 ENCOUNTER — Telehealth: Payer: Self-pay

## 2019-12-14 DIAGNOSIS — H2513 Age-related nuclear cataract, bilateral: Secondary | ICD-10-CM | POA: Diagnosis not present

## 2019-12-14 NOTE — Telephone Encounter (Signed)
Called patient to discuss if they are still a patient of Dr. Nicki Reaper, who she had seen in 01/26/2018. Carrie Mills states that currently she is not because she moved away to [facility] and is currently seeing a provider in [place], But she does want to stay Dr. Lars Mage patient if possible. She is not happy with the providers in [facility] and is uncertain if she is willing to make the drive to [facility] to see Dr. Nicki Reaper. Carrie Mills states that she is worried about any urgent issues and availability and will keep her current PCP for now but may call back later to ask if she can still see Dr. Nicki Reaper.

## 2019-12-22 DIAGNOSIS — N1832 Chronic kidney disease, stage 3b: Secondary | ICD-10-CM | POA: Diagnosis not present

## 2019-12-22 DIAGNOSIS — E1122 Type 2 diabetes mellitus with diabetic chronic kidney disease: Secondary | ICD-10-CM | POA: Diagnosis not present

## 2019-12-25 DIAGNOSIS — F3342 Major depressive disorder, recurrent, in full remission: Secondary | ICD-10-CM | POA: Diagnosis not present

## 2019-12-25 DIAGNOSIS — I152 Hypertension secondary to endocrine disorders: Secondary | ICD-10-CM | POA: Diagnosis not present

## 2019-12-25 DIAGNOSIS — N1832 Chronic kidney disease, stage 3b: Secondary | ICD-10-CM | POA: Diagnosis not present

## 2019-12-25 DIAGNOSIS — E1169 Type 2 diabetes mellitus with other specified complication: Secondary | ICD-10-CM | POA: Diagnosis not present

## 2019-12-25 DIAGNOSIS — E538 Deficiency of other specified B group vitamins: Secondary | ICD-10-CM | POA: Diagnosis not present

## 2019-12-25 DIAGNOSIS — E559 Vitamin D deficiency, unspecified: Secondary | ICD-10-CM | POA: Diagnosis not present

## 2019-12-25 DIAGNOSIS — E1122 Type 2 diabetes mellitus with diabetic chronic kidney disease: Secondary | ICD-10-CM | POA: Diagnosis not present

## 2019-12-25 DIAGNOSIS — E785 Hyperlipidemia, unspecified: Secondary | ICD-10-CM | POA: Diagnosis not present

## 2019-12-25 DIAGNOSIS — E1159 Type 2 diabetes mellitus with other circulatory complications: Secondary | ICD-10-CM | POA: Diagnosis not present

## 2020-03-23 DIAGNOSIS — S2020XA Contusion of thorax, unspecified, initial encounter: Secondary | ICD-10-CM | POA: Diagnosis not present

## 2020-03-23 DIAGNOSIS — N1832 Chronic kidney disease, stage 3b: Secondary | ICD-10-CM | POA: Diagnosis not present

## 2020-03-23 DIAGNOSIS — E1122 Type 2 diabetes mellitus with diabetic chronic kidney disease: Secondary | ICD-10-CM | POA: Diagnosis not present

## 2020-03-23 DIAGNOSIS — R55 Syncope and collapse: Secondary | ICD-10-CM | POA: Diagnosis not present

## 2020-03-23 DIAGNOSIS — E785 Hyperlipidemia, unspecified: Secondary | ICD-10-CM | POA: Diagnosis not present

## 2020-03-23 DIAGNOSIS — R509 Fever, unspecified: Secondary | ICD-10-CM | POA: Diagnosis not present

## 2020-03-23 DIAGNOSIS — T796XXA Traumatic ischemia of muscle, initial encounter: Secondary | ICD-10-CM | POA: Diagnosis not present

## 2020-03-23 DIAGNOSIS — Z20822 Contact with and (suspected) exposure to covid-19: Secondary | ICD-10-CM | POA: Diagnosis not present

## 2020-03-23 DIAGNOSIS — R4182 Altered mental status, unspecified: Secondary | ICD-10-CM | POA: Diagnosis not present

## 2020-03-23 DIAGNOSIS — E1165 Type 2 diabetes mellitus with hyperglycemia: Secondary | ICD-10-CM | POA: Diagnosis not present

## 2020-03-23 DIAGNOSIS — S0990XA Unspecified injury of head, initial encounter: Secondary | ICD-10-CM | POA: Diagnosis not present

## 2020-03-23 DIAGNOSIS — R748 Abnormal levels of other serum enzymes: Secondary | ICD-10-CM | POA: Diagnosis not present

## 2020-03-23 DIAGNOSIS — I129 Hypertensive chronic kidney disease with stage 1 through stage 4 chronic kidney disease, or unspecified chronic kidney disease: Secondary | ICD-10-CM | POA: Diagnosis not present

## 2020-03-23 DIAGNOSIS — I10 Essential (primary) hypertension: Secondary | ICD-10-CM | POA: Diagnosis not present

## 2020-03-23 DIAGNOSIS — Y999 Unspecified external cause status: Secondary | ICD-10-CM | POA: Diagnosis not present

## 2020-03-23 DIAGNOSIS — W010XXA Fall on same level from slipping, tripping and stumbling without subsequent striking against object, initial encounter: Secondary | ICD-10-CM | POA: Diagnosis not present

## 2020-03-23 DIAGNOSIS — S3993XA Unspecified injury of pelvis, initial encounter: Secondary | ICD-10-CM | POA: Diagnosis not present

## 2020-03-23 DIAGNOSIS — R410 Disorientation, unspecified: Secondary | ICD-10-CM | POA: Diagnosis not present

## 2020-03-23 DIAGNOSIS — R404 Transient alteration of awareness: Secondary | ICD-10-CM | POA: Diagnosis not present

## 2020-03-23 DIAGNOSIS — D72829 Elevated white blood cell count, unspecified: Secondary | ICD-10-CM | POA: Diagnosis not present

## 2020-03-23 DIAGNOSIS — R000 Tachycardia, unspecified: Secondary | ICD-10-CM | POA: Diagnosis not present

## 2020-03-24 DIAGNOSIS — M6281 Muscle weakness (generalized): Secondary | ICD-10-CM | POA: Diagnosis not present

## 2020-03-24 DIAGNOSIS — E119 Type 2 diabetes mellitus without complications: Secondary | ICD-10-CM | POA: Diagnosis not present

## 2020-03-24 DIAGNOSIS — W19XXXA Unspecified fall, initial encounter: Secondary | ICD-10-CM | POA: Diagnosis not present

## 2020-03-24 DIAGNOSIS — R4182 Altered mental status, unspecified: Secondary | ICD-10-CM | POA: Diagnosis not present

## 2020-03-24 DIAGNOSIS — Z794 Long term (current) use of insulin: Secondary | ICD-10-CM | POA: Diagnosis not present

## 2020-03-25 DIAGNOSIS — W19XXXA Unspecified fall, initial encounter: Secondary | ICD-10-CM | POA: Diagnosis not present

## 2020-03-25 DIAGNOSIS — M6281 Muscle weakness (generalized): Secondary | ICD-10-CM | POA: Diagnosis not present

## 2020-03-25 DIAGNOSIS — R4182 Altered mental status, unspecified: Secondary | ICD-10-CM | POA: Diagnosis not present

## 2020-03-25 DIAGNOSIS — E119 Type 2 diabetes mellitus without complications: Secondary | ICD-10-CM | POA: Diagnosis not present

## 2020-03-25 DIAGNOSIS — Z794 Long term (current) use of insulin: Secondary | ICD-10-CM | POA: Diagnosis not present

## 2020-03-26 DIAGNOSIS — R4182 Altered mental status, unspecified: Secondary | ICD-10-CM | POA: Diagnosis not present

## 2020-03-26 DIAGNOSIS — E876 Hypokalemia: Secondary | ICD-10-CM | POA: Diagnosis not present

## 2020-03-26 DIAGNOSIS — W19XXXA Unspecified fall, initial encounter: Secondary | ICD-10-CM | POA: Diagnosis not present

## 2020-03-26 DIAGNOSIS — E119 Type 2 diabetes mellitus without complications: Secondary | ICD-10-CM | POA: Diagnosis not present

## 2020-03-27 DIAGNOSIS — R296 Repeated falls: Secondary | ICD-10-CM | POA: Diagnosis not present

## 2020-03-27 DIAGNOSIS — R4182 Altered mental status, unspecified: Secondary | ICD-10-CM | POA: Diagnosis not present

## 2020-03-27 DIAGNOSIS — E876 Hypokalemia: Secondary | ICD-10-CM | POA: Diagnosis not present

## 2020-03-27 DIAGNOSIS — I10 Essential (primary) hypertension: Secondary | ICD-10-CM | POA: Diagnosis not present

## 2020-03-28 DIAGNOSIS — E1122 Type 2 diabetes mellitus with diabetic chronic kidney disease: Secondary | ICD-10-CM | POA: Diagnosis not present

## 2020-03-28 DIAGNOSIS — E785 Hyperlipidemia, unspecified: Secondary | ICD-10-CM | POA: Diagnosis not present

## 2020-03-28 DIAGNOSIS — I152 Hypertension secondary to endocrine disorders: Secondary | ICD-10-CM | POA: Diagnosis not present

## 2020-03-28 DIAGNOSIS — E1169 Type 2 diabetes mellitus with other specified complication: Secondary | ICD-10-CM | POA: Diagnosis not present

## 2020-03-28 DIAGNOSIS — J4520 Mild intermittent asthma, uncomplicated: Secondary | ICD-10-CM | POA: Diagnosis not present

## 2020-03-28 DIAGNOSIS — Z96652 Presence of left artificial knee joint: Secondary | ICD-10-CM | POA: Diagnosis not present

## 2020-03-28 DIAGNOSIS — E1159 Type 2 diabetes mellitus with other circulatory complications: Secondary | ICD-10-CM | POA: Diagnosis not present

## 2020-03-28 DIAGNOSIS — Z7951 Long term (current) use of inhaled steroids: Secondary | ICD-10-CM | POA: Diagnosis not present

## 2020-03-28 DIAGNOSIS — N1832 Chronic kidney disease, stage 3b: Secondary | ICD-10-CM | POA: Diagnosis not present

## 2020-03-29 DIAGNOSIS — I152 Hypertension secondary to endocrine disorders: Secondary | ICD-10-CM | POA: Diagnosis not present

## 2020-03-29 DIAGNOSIS — E1122 Type 2 diabetes mellitus with diabetic chronic kidney disease: Secondary | ICD-10-CM | POA: Diagnosis not present

## 2020-03-29 DIAGNOSIS — E1159 Type 2 diabetes mellitus with other circulatory complications: Secondary | ICD-10-CM | POA: Diagnosis not present

## 2020-03-29 DIAGNOSIS — E785 Hyperlipidemia, unspecified: Secondary | ICD-10-CM | POA: Diagnosis not present

## 2020-03-29 DIAGNOSIS — Z96652 Presence of left artificial knee joint: Secondary | ICD-10-CM | POA: Diagnosis not present

## 2020-03-29 DIAGNOSIS — E1169 Type 2 diabetes mellitus with other specified complication: Secondary | ICD-10-CM | POA: Diagnosis not present

## 2020-03-29 DIAGNOSIS — Z7951 Long term (current) use of inhaled steroids: Secondary | ICD-10-CM | POA: Diagnosis not present

## 2020-03-29 DIAGNOSIS — J4520 Mild intermittent asthma, uncomplicated: Secondary | ICD-10-CM | POA: Diagnosis not present

## 2020-03-29 DIAGNOSIS — N1832 Chronic kidney disease, stage 3b: Secondary | ICD-10-CM | POA: Diagnosis not present

## 2020-03-30 DIAGNOSIS — R300 Dysuria: Secondary | ICD-10-CM | POA: Diagnosis not present

## 2020-04-02 DIAGNOSIS — N3941 Urge incontinence: Secondary | ICD-10-CM | POA: Diagnosis not present

## 2020-04-02 DIAGNOSIS — N3000 Acute cystitis without hematuria: Secondary | ICD-10-CM | POA: Diagnosis not present

## 2020-04-02 DIAGNOSIS — F3341 Major depressive disorder, recurrent, in partial remission: Secondary | ICD-10-CM | POA: Diagnosis not present

## 2020-04-02 DIAGNOSIS — E1159 Type 2 diabetes mellitus with other circulatory complications: Secondary | ICD-10-CM | POA: Diagnosis not present

## 2020-04-02 DIAGNOSIS — E1169 Type 2 diabetes mellitus with other specified complication: Secondary | ICD-10-CM | POA: Diagnosis not present

## 2020-04-02 DIAGNOSIS — S06369A Traumatic hemorrhage of cerebrum, unspecified, with loss of consciousness of unspecified duration, initial encounter: Secondary | ICD-10-CM | POA: Diagnosis not present

## 2020-04-02 DIAGNOSIS — I152 Hypertension secondary to endocrine disorders: Secondary | ICD-10-CM | POA: Diagnosis not present

## 2020-04-02 DIAGNOSIS — W19XXXD Unspecified fall, subsequent encounter: Secondary | ICD-10-CM | POA: Diagnosis not present

## 2020-04-02 DIAGNOSIS — Z7409 Other reduced mobility: Secondary | ICD-10-CM | POA: Diagnosis not present

## 2020-04-04 DIAGNOSIS — Z7951 Long term (current) use of inhaled steroids: Secondary | ICD-10-CM | POA: Diagnosis not present

## 2020-04-04 DIAGNOSIS — E1122 Type 2 diabetes mellitus with diabetic chronic kidney disease: Secondary | ICD-10-CM | POA: Diagnosis not present

## 2020-04-04 DIAGNOSIS — E1159 Type 2 diabetes mellitus with other circulatory complications: Secondary | ICD-10-CM | POA: Diagnosis not present

## 2020-04-04 DIAGNOSIS — N1832 Chronic kidney disease, stage 3b: Secondary | ICD-10-CM | POA: Diagnosis not present

## 2020-04-04 DIAGNOSIS — Z96652 Presence of left artificial knee joint: Secondary | ICD-10-CM | POA: Diagnosis not present

## 2020-04-04 DIAGNOSIS — E785 Hyperlipidemia, unspecified: Secondary | ICD-10-CM | POA: Diagnosis not present

## 2020-04-04 DIAGNOSIS — I152 Hypertension secondary to endocrine disorders: Secondary | ICD-10-CM | POA: Diagnosis not present

## 2020-04-04 DIAGNOSIS — E1169 Type 2 diabetes mellitus with other specified complication: Secondary | ICD-10-CM | POA: Diagnosis not present

## 2020-04-04 DIAGNOSIS — J4520 Mild intermittent asthma, uncomplicated: Secondary | ICD-10-CM | POA: Diagnosis not present

## 2020-04-05 DIAGNOSIS — E785 Hyperlipidemia, unspecified: Secondary | ICD-10-CM | POA: Diagnosis not present

## 2020-04-05 DIAGNOSIS — E1169 Type 2 diabetes mellitus with other specified complication: Secondary | ICD-10-CM | POA: Diagnosis not present

## 2020-04-05 DIAGNOSIS — Z7951 Long term (current) use of inhaled steroids: Secondary | ICD-10-CM | POA: Diagnosis not present

## 2020-04-05 DIAGNOSIS — E1159 Type 2 diabetes mellitus with other circulatory complications: Secondary | ICD-10-CM | POA: Diagnosis not present

## 2020-04-05 DIAGNOSIS — I152 Hypertension secondary to endocrine disorders: Secondary | ICD-10-CM | POA: Diagnosis not present

## 2020-04-05 DIAGNOSIS — Z96652 Presence of left artificial knee joint: Secondary | ICD-10-CM | POA: Diagnosis not present

## 2020-04-05 DIAGNOSIS — J4520 Mild intermittent asthma, uncomplicated: Secondary | ICD-10-CM | POA: Diagnosis not present

## 2020-04-05 DIAGNOSIS — E1122 Type 2 diabetes mellitus with diabetic chronic kidney disease: Secondary | ICD-10-CM | POA: Diagnosis not present

## 2020-04-05 DIAGNOSIS — N1832 Chronic kidney disease, stage 3b: Secondary | ICD-10-CM | POA: Diagnosis not present

## 2020-04-09 DIAGNOSIS — E1122 Type 2 diabetes mellitus with diabetic chronic kidney disease: Secondary | ICD-10-CM | POA: Diagnosis not present

## 2020-04-09 DIAGNOSIS — E1159 Type 2 diabetes mellitus with other circulatory complications: Secondary | ICD-10-CM | POA: Diagnosis not present

## 2020-04-09 DIAGNOSIS — J4520 Mild intermittent asthma, uncomplicated: Secondary | ICD-10-CM | POA: Diagnosis not present

## 2020-04-09 DIAGNOSIS — N1832 Chronic kidney disease, stage 3b: Secondary | ICD-10-CM | POA: Diagnosis not present

## 2020-04-09 DIAGNOSIS — E785 Hyperlipidemia, unspecified: Secondary | ICD-10-CM | POA: Diagnosis not present

## 2020-04-09 DIAGNOSIS — I152 Hypertension secondary to endocrine disorders: Secondary | ICD-10-CM | POA: Diagnosis not present

## 2020-04-09 DIAGNOSIS — Z96652 Presence of left artificial knee joint: Secondary | ICD-10-CM | POA: Diagnosis not present

## 2020-04-09 DIAGNOSIS — E1169 Type 2 diabetes mellitus with other specified complication: Secondary | ICD-10-CM | POA: Diagnosis not present

## 2020-04-09 DIAGNOSIS — Z7951 Long term (current) use of inhaled steroids: Secondary | ICD-10-CM | POA: Diagnosis not present

## 2020-04-11 DIAGNOSIS — I152 Hypertension secondary to endocrine disorders: Secondary | ICD-10-CM | POA: Diagnosis not present

## 2020-04-11 DIAGNOSIS — N1832 Chronic kidney disease, stage 3b: Secondary | ICD-10-CM | POA: Diagnosis not present

## 2020-04-11 DIAGNOSIS — E1159 Type 2 diabetes mellitus with other circulatory complications: Secondary | ICD-10-CM | POA: Diagnosis not present

## 2020-04-11 DIAGNOSIS — E785 Hyperlipidemia, unspecified: Secondary | ICD-10-CM | POA: Diagnosis not present

## 2020-04-11 DIAGNOSIS — Z96652 Presence of left artificial knee joint: Secondary | ICD-10-CM | POA: Diagnosis not present

## 2020-04-11 DIAGNOSIS — J4520 Mild intermittent asthma, uncomplicated: Secondary | ICD-10-CM | POA: Diagnosis not present

## 2020-04-11 DIAGNOSIS — Z7951 Long term (current) use of inhaled steroids: Secondary | ICD-10-CM | POA: Diagnosis not present

## 2020-04-11 DIAGNOSIS — E1169 Type 2 diabetes mellitus with other specified complication: Secondary | ICD-10-CM | POA: Diagnosis not present

## 2020-04-11 DIAGNOSIS — E1122 Type 2 diabetes mellitus with diabetic chronic kidney disease: Secondary | ICD-10-CM | POA: Diagnosis not present

## 2020-04-12 DIAGNOSIS — E1122 Type 2 diabetes mellitus with diabetic chronic kidney disease: Secondary | ICD-10-CM | POA: Diagnosis not present

## 2020-04-12 DIAGNOSIS — J4520 Mild intermittent asthma, uncomplicated: Secondary | ICD-10-CM | POA: Diagnosis not present

## 2020-04-12 DIAGNOSIS — E1159 Type 2 diabetes mellitus with other circulatory complications: Secondary | ICD-10-CM | POA: Diagnosis not present

## 2020-04-12 DIAGNOSIS — Z96652 Presence of left artificial knee joint: Secondary | ICD-10-CM | POA: Diagnosis not present

## 2020-04-12 DIAGNOSIS — E785 Hyperlipidemia, unspecified: Secondary | ICD-10-CM | POA: Diagnosis not present

## 2020-04-12 DIAGNOSIS — E1169 Type 2 diabetes mellitus with other specified complication: Secondary | ICD-10-CM | POA: Diagnosis not present

## 2020-04-12 DIAGNOSIS — Z7951 Long term (current) use of inhaled steroids: Secondary | ICD-10-CM | POA: Diagnosis not present

## 2020-04-12 DIAGNOSIS — N1832 Chronic kidney disease, stage 3b: Secondary | ICD-10-CM | POA: Diagnosis not present

## 2020-04-12 DIAGNOSIS — I152 Hypertension secondary to endocrine disorders: Secondary | ICD-10-CM | POA: Diagnosis not present

## 2020-04-15 DIAGNOSIS — J4520 Mild intermittent asthma, uncomplicated: Secondary | ICD-10-CM | POA: Diagnosis not present

## 2020-04-15 DIAGNOSIS — Z96652 Presence of left artificial knee joint: Secondary | ICD-10-CM | POA: Diagnosis not present

## 2020-04-15 DIAGNOSIS — E1169 Type 2 diabetes mellitus with other specified complication: Secondary | ICD-10-CM | POA: Diagnosis not present

## 2020-04-15 DIAGNOSIS — N1832 Chronic kidney disease, stage 3b: Secondary | ICD-10-CM | POA: Diagnosis not present

## 2020-04-15 DIAGNOSIS — E785 Hyperlipidemia, unspecified: Secondary | ICD-10-CM | POA: Diagnosis not present

## 2020-04-15 DIAGNOSIS — E1159 Type 2 diabetes mellitus with other circulatory complications: Secondary | ICD-10-CM | POA: Diagnosis not present

## 2020-04-15 DIAGNOSIS — Z7951 Long term (current) use of inhaled steroids: Secondary | ICD-10-CM | POA: Diagnosis not present

## 2020-04-15 DIAGNOSIS — E1122 Type 2 diabetes mellitus with diabetic chronic kidney disease: Secondary | ICD-10-CM | POA: Diagnosis not present

## 2020-04-15 DIAGNOSIS — I152 Hypertension secondary to endocrine disorders: Secondary | ICD-10-CM | POA: Diagnosis not present

## 2020-04-16 DIAGNOSIS — I129 Hypertensive chronic kidney disease with stage 1 through stage 4 chronic kidney disease, or unspecified chronic kidney disease: Secondary | ICD-10-CM | POA: Diagnosis not present

## 2020-04-16 DIAGNOSIS — F0150 Vascular dementia without behavioral disturbance: Secondary | ICD-10-CM | POA: Diagnosis not present

## 2020-04-16 DIAGNOSIS — E1159 Type 2 diabetes mellitus with other circulatory complications: Secondary | ICD-10-CM | POA: Diagnosis not present

## 2020-04-16 DIAGNOSIS — E1122 Type 2 diabetes mellitus with diabetic chronic kidney disease: Secondary | ICD-10-CM | POA: Diagnosis not present

## 2020-04-16 DIAGNOSIS — N1832 Chronic kidney disease, stage 3b: Secondary | ICD-10-CM | POA: Diagnosis not present

## 2020-04-16 DIAGNOSIS — I6381 Other cerebral infarction due to occlusion or stenosis of small artery: Secondary | ICD-10-CM | POA: Diagnosis not present

## 2020-04-17 DIAGNOSIS — E1159 Type 2 diabetes mellitus with other circulatory complications: Secondary | ICD-10-CM | POA: Diagnosis not present

## 2020-04-17 DIAGNOSIS — Z7951 Long term (current) use of inhaled steroids: Secondary | ICD-10-CM | POA: Diagnosis not present

## 2020-04-17 DIAGNOSIS — E785 Hyperlipidemia, unspecified: Secondary | ICD-10-CM | POA: Diagnosis not present

## 2020-04-17 DIAGNOSIS — E1169 Type 2 diabetes mellitus with other specified complication: Secondary | ICD-10-CM | POA: Diagnosis not present

## 2020-04-17 DIAGNOSIS — I150 Renovascular hypertension: Secondary | ICD-10-CM | POA: Diagnosis not present

## 2020-04-17 DIAGNOSIS — J4520 Mild intermittent asthma, uncomplicated: Secondary | ICD-10-CM | POA: Diagnosis not present

## 2020-04-17 DIAGNOSIS — Z96652 Presence of left artificial knee joint: Secondary | ICD-10-CM | POA: Diagnosis not present

## 2020-04-17 DIAGNOSIS — I152 Hypertension secondary to endocrine disorders: Secondary | ICD-10-CM | POA: Diagnosis not present

## 2020-04-17 DIAGNOSIS — N1832 Chronic kidney disease, stage 3b: Secondary | ICD-10-CM | POA: Diagnosis not present

## 2020-04-17 DIAGNOSIS — E1122 Type 2 diabetes mellitus with diabetic chronic kidney disease: Secondary | ICD-10-CM | POA: Diagnosis not present

## 2020-04-18 DIAGNOSIS — Z7951 Long term (current) use of inhaled steroids: Secondary | ICD-10-CM | POA: Diagnosis not present

## 2020-04-18 DIAGNOSIS — E1122 Type 2 diabetes mellitus with diabetic chronic kidney disease: Secondary | ICD-10-CM | POA: Diagnosis not present

## 2020-04-18 DIAGNOSIS — N1832 Chronic kidney disease, stage 3b: Secondary | ICD-10-CM | POA: Diagnosis not present

## 2020-04-18 DIAGNOSIS — E785 Hyperlipidemia, unspecified: Secondary | ICD-10-CM | POA: Diagnosis not present

## 2020-04-18 DIAGNOSIS — E1159 Type 2 diabetes mellitus with other circulatory complications: Secondary | ICD-10-CM | POA: Diagnosis not present

## 2020-04-18 DIAGNOSIS — Z96652 Presence of left artificial knee joint: Secondary | ICD-10-CM | POA: Diagnosis not present

## 2020-04-18 DIAGNOSIS — I152 Hypertension secondary to endocrine disorders: Secondary | ICD-10-CM | POA: Diagnosis not present

## 2020-04-18 DIAGNOSIS — E1169 Type 2 diabetes mellitus with other specified complication: Secondary | ICD-10-CM | POA: Diagnosis not present

## 2020-04-18 DIAGNOSIS — J4520 Mild intermittent asthma, uncomplicated: Secondary | ICD-10-CM | POA: Diagnosis not present

## 2020-04-19 DIAGNOSIS — I152 Hypertension secondary to endocrine disorders: Secondary | ICD-10-CM | POA: Diagnosis not present

## 2020-04-19 DIAGNOSIS — Z96652 Presence of left artificial knee joint: Secondary | ICD-10-CM | POA: Diagnosis not present

## 2020-04-19 DIAGNOSIS — Z7951 Long term (current) use of inhaled steroids: Secondary | ICD-10-CM | POA: Diagnosis not present

## 2020-04-19 DIAGNOSIS — J4520 Mild intermittent asthma, uncomplicated: Secondary | ICD-10-CM | POA: Diagnosis not present

## 2020-04-19 DIAGNOSIS — N1832 Chronic kidney disease, stage 3b: Secondary | ICD-10-CM | POA: Diagnosis not present

## 2020-04-19 DIAGNOSIS — E1159 Type 2 diabetes mellitus with other circulatory complications: Secondary | ICD-10-CM | POA: Diagnosis not present

## 2020-04-19 DIAGNOSIS — E1169 Type 2 diabetes mellitus with other specified complication: Secondary | ICD-10-CM | POA: Diagnosis not present

## 2020-04-19 DIAGNOSIS — E785 Hyperlipidemia, unspecified: Secondary | ICD-10-CM | POA: Diagnosis not present

## 2020-04-19 DIAGNOSIS — E1122 Type 2 diabetes mellitus with diabetic chronic kidney disease: Secondary | ICD-10-CM | POA: Diagnosis not present

## 2020-04-23 DIAGNOSIS — Z7951 Long term (current) use of inhaled steroids: Secondary | ICD-10-CM | POA: Diagnosis not present

## 2020-04-23 DIAGNOSIS — E1122 Type 2 diabetes mellitus with diabetic chronic kidney disease: Secondary | ICD-10-CM | POA: Diagnosis not present

## 2020-04-23 DIAGNOSIS — I152 Hypertension secondary to endocrine disorders: Secondary | ICD-10-CM | POA: Diagnosis not present

## 2020-04-23 DIAGNOSIS — J4520 Mild intermittent asthma, uncomplicated: Secondary | ICD-10-CM | POA: Diagnosis not present

## 2020-04-23 DIAGNOSIS — E1159 Type 2 diabetes mellitus with other circulatory complications: Secondary | ICD-10-CM | POA: Diagnosis not present

## 2020-04-23 DIAGNOSIS — E785 Hyperlipidemia, unspecified: Secondary | ICD-10-CM | POA: Diagnosis not present

## 2020-04-23 DIAGNOSIS — Z96652 Presence of left artificial knee joint: Secondary | ICD-10-CM | POA: Diagnosis not present

## 2020-04-23 DIAGNOSIS — E1169 Type 2 diabetes mellitus with other specified complication: Secondary | ICD-10-CM | POA: Diagnosis not present

## 2020-04-23 DIAGNOSIS — N1832 Chronic kidney disease, stage 3b: Secondary | ICD-10-CM | POA: Diagnosis not present

## 2020-04-24 DIAGNOSIS — Z7951 Long term (current) use of inhaled steroids: Secondary | ICD-10-CM | POA: Diagnosis not present

## 2020-04-24 DIAGNOSIS — I152 Hypertension secondary to endocrine disorders: Secondary | ICD-10-CM | POA: Diagnosis not present

## 2020-04-24 DIAGNOSIS — E1122 Type 2 diabetes mellitus with diabetic chronic kidney disease: Secondary | ICD-10-CM | POA: Diagnosis not present

## 2020-04-24 DIAGNOSIS — J4520 Mild intermittent asthma, uncomplicated: Secondary | ICD-10-CM | POA: Diagnosis not present

## 2020-04-24 DIAGNOSIS — E1159 Type 2 diabetes mellitus with other circulatory complications: Secondary | ICD-10-CM | POA: Diagnosis not present

## 2020-04-24 DIAGNOSIS — Z96652 Presence of left artificial knee joint: Secondary | ICD-10-CM | POA: Diagnosis not present

## 2020-04-24 DIAGNOSIS — N1832 Chronic kidney disease, stage 3b: Secondary | ICD-10-CM | POA: Diagnosis not present

## 2020-04-24 DIAGNOSIS — E785 Hyperlipidemia, unspecified: Secondary | ICD-10-CM | POA: Diagnosis not present

## 2020-04-24 DIAGNOSIS — E1169 Type 2 diabetes mellitus with other specified complication: Secondary | ICD-10-CM | POA: Diagnosis not present

## 2020-04-25 DIAGNOSIS — E785 Hyperlipidemia, unspecified: Secondary | ICD-10-CM | POA: Diagnosis not present

## 2020-04-25 DIAGNOSIS — E1159 Type 2 diabetes mellitus with other circulatory complications: Secondary | ICD-10-CM | POA: Diagnosis not present

## 2020-04-25 DIAGNOSIS — E1169 Type 2 diabetes mellitus with other specified complication: Secondary | ICD-10-CM | POA: Diagnosis not present

## 2020-04-25 DIAGNOSIS — I152 Hypertension secondary to endocrine disorders: Secondary | ICD-10-CM | POA: Diagnosis not present

## 2020-04-25 DIAGNOSIS — E1122 Type 2 diabetes mellitus with diabetic chronic kidney disease: Secondary | ICD-10-CM | POA: Diagnosis not present

## 2020-04-25 DIAGNOSIS — J4520 Mild intermittent asthma, uncomplicated: Secondary | ICD-10-CM | POA: Diagnosis not present

## 2020-04-25 DIAGNOSIS — Z96652 Presence of left artificial knee joint: Secondary | ICD-10-CM | POA: Diagnosis not present

## 2020-04-25 DIAGNOSIS — Z7951 Long term (current) use of inhaled steroids: Secondary | ICD-10-CM | POA: Diagnosis not present

## 2020-04-25 DIAGNOSIS — N1832 Chronic kidney disease, stage 3b: Secondary | ICD-10-CM | POA: Diagnosis not present

## 2020-04-26 DIAGNOSIS — Z96652 Presence of left artificial knee joint: Secondary | ICD-10-CM | POA: Diagnosis not present

## 2020-04-26 DIAGNOSIS — Z7951 Long term (current) use of inhaled steroids: Secondary | ICD-10-CM | POA: Diagnosis not present

## 2020-04-26 DIAGNOSIS — E785 Hyperlipidemia, unspecified: Secondary | ICD-10-CM | POA: Diagnosis not present

## 2020-04-26 DIAGNOSIS — E1169 Type 2 diabetes mellitus with other specified complication: Secondary | ICD-10-CM | POA: Diagnosis not present

## 2020-04-26 DIAGNOSIS — I152 Hypertension secondary to endocrine disorders: Secondary | ICD-10-CM | POA: Diagnosis not present

## 2020-04-26 DIAGNOSIS — J4520 Mild intermittent asthma, uncomplicated: Secondary | ICD-10-CM | POA: Diagnosis not present

## 2020-04-26 DIAGNOSIS — E1122 Type 2 diabetes mellitus with diabetic chronic kidney disease: Secondary | ICD-10-CM | POA: Diagnosis not present

## 2020-04-26 DIAGNOSIS — E1159 Type 2 diabetes mellitus with other circulatory complications: Secondary | ICD-10-CM | POA: Diagnosis not present

## 2020-04-26 DIAGNOSIS — N1832 Chronic kidney disease, stage 3b: Secondary | ICD-10-CM | POA: Diagnosis not present

## 2020-04-27 DIAGNOSIS — E1159 Type 2 diabetes mellitus with other circulatory complications: Secondary | ICD-10-CM | POA: Diagnosis not present

## 2020-04-27 DIAGNOSIS — E1122 Type 2 diabetes mellitus with diabetic chronic kidney disease: Secondary | ICD-10-CM | POA: Diagnosis not present

## 2020-04-27 DIAGNOSIS — N1832 Chronic kidney disease, stage 3b: Secondary | ICD-10-CM | POA: Diagnosis not present

## 2020-04-27 DIAGNOSIS — Z7951 Long term (current) use of inhaled steroids: Secondary | ICD-10-CM | POA: Diagnosis not present

## 2020-04-27 DIAGNOSIS — I152 Hypertension secondary to endocrine disorders: Secondary | ICD-10-CM | POA: Diagnosis not present

## 2020-04-27 DIAGNOSIS — J4520 Mild intermittent asthma, uncomplicated: Secondary | ICD-10-CM | POA: Diagnosis not present

## 2020-04-27 DIAGNOSIS — E1169 Type 2 diabetes mellitus with other specified complication: Secondary | ICD-10-CM | POA: Diagnosis not present

## 2020-04-27 DIAGNOSIS — Z96652 Presence of left artificial knee joint: Secondary | ICD-10-CM | POA: Diagnosis not present

## 2020-04-27 DIAGNOSIS — E785 Hyperlipidemia, unspecified: Secondary | ICD-10-CM | POA: Diagnosis not present

## 2020-04-29 DIAGNOSIS — N1832 Chronic kidney disease, stage 3b: Secondary | ICD-10-CM | POA: Diagnosis not present

## 2020-04-29 DIAGNOSIS — I152 Hypertension secondary to endocrine disorders: Secondary | ICD-10-CM | POA: Diagnosis not present

## 2020-04-29 DIAGNOSIS — E1169 Type 2 diabetes mellitus with other specified complication: Secondary | ICD-10-CM | POA: Diagnosis not present

## 2020-04-29 DIAGNOSIS — E785 Hyperlipidemia, unspecified: Secondary | ICD-10-CM | POA: Diagnosis not present

## 2020-04-29 DIAGNOSIS — Z7951 Long term (current) use of inhaled steroids: Secondary | ICD-10-CM | POA: Diagnosis not present

## 2020-04-29 DIAGNOSIS — Z96652 Presence of left artificial knee joint: Secondary | ICD-10-CM | POA: Diagnosis not present

## 2020-04-29 DIAGNOSIS — E1159 Type 2 diabetes mellitus with other circulatory complications: Secondary | ICD-10-CM | POA: Diagnosis not present

## 2020-04-29 DIAGNOSIS — J4520 Mild intermittent asthma, uncomplicated: Secondary | ICD-10-CM | POA: Diagnosis not present

## 2020-04-29 DIAGNOSIS — E1122 Type 2 diabetes mellitus with diabetic chronic kidney disease: Secondary | ICD-10-CM | POA: Diagnosis not present

## 2020-05-02 DIAGNOSIS — E1169 Type 2 diabetes mellitus with other specified complication: Secondary | ICD-10-CM | POA: Diagnosis not present

## 2020-05-02 DIAGNOSIS — J4520 Mild intermittent asthma, uncomplicated: Secondary | ICD-10-CM | POA: Diagnosis not present

## 2020-05-02 DIAGNOSIS — N1832 Chronic kidney disease, stage 3b: Secondary | ICD-10-CM | POA: Diagnosis not present

## 2020-05-02 DIAGNOSIS — E1159 Type 2 diabetes mellitus with other circulatory complications: Secondary | ICD-10-CM | POA: Diagnosis not present

## 2020-05-02 DIAGNOSIS — E1122 Type 2 diabetes mellitus with diabetic chronic kidney disease: Secondary | ICD-10-CM | POA: Diagnosis not present

## 2020-05-02 DIAGNOSIS — Z7951 Long term (current) use of inhaled steroids: Secondary | ICD-10-CM | POA: Diagnosis not present

## 2020-05-02 DIAGNOSIS — E785 Hyperlipidemia, unspecified: Secondary | ICD-10-CM | POA: Diagnosis not present

## 2020-05-02 DIAGNOSIS — Z96652 Presence of left artificial knee joint: Secondary | ICD-10-CM | POA: Diagnosis not present

## 2020-05-02 DIAGNOSIS — I152 Hypertension secondary to endocrine disorders: Secondary | ICD-10-CM | POA: Diagnosis not present

## 2020-05-03 DIAGNOSIS — E1169 Type 2 diabetes mellitus with other specified complication: Secondary | ICD-10-CM | POA: Diagnosis not present

## 2020-05-03 DIAGNOSIS — E785 Hyperlipidemia, unspecified: Secondary | ICD-10-CM | POA: Diagnosis not present

## 2020-05-03 DIAGNOSIS — E1159 Type 2 diabetes mellitus with other circulatory complications: Secondary | ICD-10-CM | POA: Diagnosis not present

## 2020-05-03 DIAGNOSIS — J4520 Mild intermittent asthma, uncomplicated: Secondary | ICD-10-CM | POA: Diagnosis not present

## 2020-05-03 DIAGNOSIS — Z96652 Presence of left artificial knee joint: Secondary | ICD-10-CM | POA: Diagnosis not present

## 2020-05-03 DIAGNOSIS — E1122 Type 2 diabetes mellitus with diabetic chronic kidney disease: Secondary | ICD-10-CM | POA: Diagnosis not present

## 2020-05-03 DIAGNOSIS — N1832 Chronic kidney disease, stage 3b: Secondary | ICD-10-CM | POA: Diagnosis not present

## 2020-05-03 DIAGNOSIS — I152 Hypertension secondary to endocrine disorders: Secondary | ICD-10-CM | POA: Diagnosis not present

## 2020-05-03 DIAGNOSIS — Z7951 Long term (current) use of inhaled steroids: Secondary | ICD-10-CM | POA: Diagnosis not present

## 2020-05-08 DIAGNOSIS — Z96652 Presence of left artificial knee joint: Secondary | ICD-10-CM | POA: Diagnosis not present

## 2020-05-08 DIAGNOSIS — I152 Hypertension secondary to endocrine disorders: Secondary | ICD-10-CM | POA: Diagnosis not present

## 2020-05-08 DIAGNOSIS — E785 Hyperlipidemia, unspecified: Secondary | ICD-10-CM | POA: Diagnosis not present

## 2020-05-08 DIAGNOSIS — E1169 Type 2 diabetes mellitus with other specified complication: Secondary | ICD-10-CM | POA: Diagnosis not present

## 2020-05-08 DIAGNOSIS — E1122 Type 2 diabetes mellitus with diabetic chronic kidney disease: Secondary | ICD-10-CM | POA: Diagnosis not present

## 2020-05-08 DIAGNOSIS — E1159 Type 2 diabetes mellitus with other circulatory complications: Secondary | ICD-10-CM | POA: Diagnosis not present

## 2020-05-08 DIAGNOSIS — N1832 Chronic kidney disease, stage 3b: Secondary | ICD-10-CM | POA: Diagnosis not present

## 2020-05-08 DIAGNOSIS — J4520 Mild intermittent asthma, uncomplicated: Secondary | ICD-10-CM | POA: Diagnosis not present

## 2020-05-08 DIAGNOSIS — Z7951 Long term (current) use of inhaled steroids: Secondary | ICD-10-CM | POA: Diagnosis not present

## 2020-05-09 DIAGNOSIS — I152 Hypertension secondary to endocrine disorders: Secondary | ICD-10-CM | POA: Diagnosis not present

## 2020-05-09 DIAGNOSIS — E1122 Type 2 diabetes mellitus with diabetic chronic kidney disease: Secondary | ICD-10-CM | POA: Diagnosis not present

## 2020-05-09 DIAGNOSIS — Z96652 Presence of left artificial knee joint: Secondary | ICD-10-CM | POA: Diagnosis not present

## 2020-05-09 DIAGNOSIS — E1159 Type 2 diabetes mellitus with other circulatory complications: Secondary | ICD-10-CM | POA: Diagnosis not present

## 2020-05-09 DIAGNOSIS — J4520 Mild intermittent asthma, uncomplicated: Secondary | ICD-10-CM | POA: Diagnosis not present

## 2020-05-09 DIAGNOSIS — F0150 Vascular dementia without behavioral disturbance: Secondary | ICD-10-CM | POA: Diagnosis not present

## 2020-05-09 DIAGNOSIS — E785 Hyperlipidemia, unspecified: Secondary | ICD-10-CM | POA: Diagnosis not present

## 2020-05-09 DIAGNOSIS — Z7951 Long term (current) use of inhaled steroids: Secondary | ICD-10-CM | POA: Diagnosis not present

## 2020-05-09 DIAGNOSIS — E1169 Type 2 diabetes mellitus with other specified complication: Secondary | ICD-10-CM | POA: Diagnosis not present

## 2020-05-09 DIAGNOSIS — N1832 Chronic kidney disease, stage 3b: Secondary | ICD-10-CM | POA: Diagnosis not present

## 2020-05-09 DIAGNOSIS — I6381 Other cerebral infarction due to occlusion or stenosis of small artery: Secondary | ICD-10-CM | POA: Diagnosis not present

## 2020-05-15 DIAGNOSIS — I152 Hypertension secondary to endocrine disorders: Secondary | ICD-10-CM | POA: Diagnosis not present

## 2020-05-15 DIAGNOSIS — E1169 Type 2 diabetes mellitus with other specified complication: Secondary | ICD-10-CM | POA: Diagnosis not present

## 2020-05-15 DIAGNOSIS — J4520 Mild intermittent asthma, uncomplicated: Secondary | ICD-10-CM | POA: Diagnosis not present

## 2020-05-15 DIAGNOSIS — E1159 Type 2 diabetes mellitus with other circulatory complications: Secondary | ICD-10-CM | POA: Diagnosis not present

## 2020-05-15 DIAGNOSIS — E785 Hyperlipidemia, unspecified: Secondary | ICD-10-CM | POA: Diagnosis not present

## 2020-05-15 DIAGNOSIS — Z7951 Long term (current) use of inhaled steroids: Secondary | ICD-10-CM | POA: Diagnosis not present

## 2020-05-15 DIAGNOSIS — E1122 Type 2 diabetes mellitus with diabetic chronic kidney disease: Secondary | ICD-10-CM | POA: Diagnosis not present

## 2020-05-15 DIAGNOSIS — N1832 Chronic kidney disease, stage 3b: Secondary | ICD-10-CM | POA: Diagnosis not present

## 2020-05-15 DIAGNOSIS — Z96652 Presence of left artificial knee joint: Secondary | ICD-10-CM | POA: Diagnosis not present

## 2020-05-20 DIAGNOSIS — R300 Dysuria: Secondary | ICD-10-CM | POA: Diagnosis not present

## 2020-05-22 DIAGNOSIS — E785 Hyperlipidemia, unspecified: Secondary | ICD-10-CM | POA: Diagnosis not present

## 2020-05-22 DIAGNOSIS — E1169 Type 2 diabetes mellitus with other specified complication: Secondary | ICD-10-CM | POA: Diagnosis not present

## 2020-05-22 DIAGNOSIS — Z7951 Long term (current) use of inhaled steroids: Secondary | ICD-10-CM | POA: Diagnosis not present

## 2020-05-22 DIAGNOSIS — I152 Hypertension secondary to endocrine disorders: Secondary | ICD-10-CM | POA: Diagnosis not present

## 2020-05-22 DIAGNOSIS — E1122 Type 2 diabetes mellitus with diabetic chronic kidney disease: Secondary | ICD-10-CM | POA: Diagnosis not present

## 2020-05-22 DIAGNOSIS — E1159 Type 2 diabetes mellitus with other circulatory complications: Secondary | ICD-10-CM | POA: Diagnosis not present

## 2020-05-22 DIAGNOSIS — J4520 Mild intermittent asthma, uncomplicated: Secondary | ICD-10-CM | POA: Diagnosis not present

## 2020-05-22 DIAGNOSIS — N1832 Chronic kidney disease, stage 3b: Secondary | ICD-10-CM | POA: Diagnosis not present

## 2020-05-22 DIAGNOSIS — Z96652 Presence of left artificial knee joint: Secondary | ICD-10-CM | POA: Diagnosis not present

## 2020-05-23 DIAGNOSIS — E1169 Type 2 diabetes mellitus with other specified complication: Secondary | ICD-10-CM | POA: Diagnosis not present

## 2020-05-23 DIAGNOSIS — Z7951 Long term (current) use of inhaled steroids: Secondary | ICD-10-CM | POA: Diagnosis not present

## 2020-05-23 DIAGNOSIS — Z96652 Presence of left artificial knee joint: Secondary | ICD-10-CM | POA: Diagnosis not present

## 2020-05-23 DIAGNOSIS — J4520 Mild intermittent asthma, uncomplicated: Secondary | ICD-10-CM | POA: Diagnosis not present

## 2020-05-23 DIAGNOSIS — I152 Hypertension secondary to endocrine disorders: Secondary | ICD-10-CM | POA: Diagnosis not present

## 2020-05-23 DIAGNOSIS — N1832 Chronic kidney disease, stage 3b: Secondary | ICD-10-CM | POA: Diagnosis not present

## 2020-05-23 DIAGNOSIS — E785 Hyperlipidemia, unspecified: Secondary | ICD-10-CM | POA: Diagnosis not present

## 2020-05-23 DIAGNOSIS — E1159 Type 2 diabetes mellitus with other circulatory complications: Secondary | ICD-10-CM | POA: Diagnosis not present

## 2020-05-23 DIAGNOSIS — E1122 Type 2 diabetes mellitus with diabetic chronic kidney disease: Secondary | ICD-10-CM | POA: Diagnosis not present

## 2020-05-27 DIAGNOSIS — E1122 Type 2 diabetes mellitus with diabetic chronic kidney disease: Secondary | ICD-10-CM | POA: Diagnosis not present

## 2020-05-27 DIAGNOSIS — I152 Hypertension secondary to endocrine disorders: Secondary | ICD-10-CM | POA: Diagnosis not present

## 2020-05-27 DIAGNOSIS — I69318 Other symptoms and signs involving cognitive functions following cerebral infarction: Secondary | ICD-10-CM | POA: Diagnosis not present

## 2020-05-27 DIAGNOSIS — F0150 Vascular dementia without behavioral disturbance: Secondary | ICD-10-CM | POA: Diagnosis not present

## 2020-05-27 DIAGNOSIS — E7800 Pure hypercholesterolemia, unspecified: Secondary | ICD-10-CM | POA: Diagnosis not present

## 2020-05-27 DIAGNOSIS — E1159 Type 2 diabetes mellitus with other circulatory complications: Secondary | ICD-10-CM | POA: Diagnosis not present

## 2020-05-27 DIAGNOSIS — J4520 Mild intermittent asthma, uncomplicated: Secondary | ICD-10-CM | POA: Diagnosis not present

## 2020-05-27 DIAGNOSIS — E1169 Type 2 diabetes mellitus with other specified complication: Secondary | ICD-10-CM | POA: Diagnosis not present

## 2020-05-27 DIAGNOSIS — N1832 Chronic kidney disease, stage 3b: Secondary | ICD-10-CM | POA: Diagnosis not present

## 2020-05-30 DIAGNOSIS — E1159 Type 2 diabetes mellitus with other circulatory complications: Secondary | ICD-10-CM | POA: Diagnosis not present

## 2020-05-30 DIAGNOSIS — I69318 Other symptoms and signs involving cognitive functions following cerebral infarction: Secondary | ICD-10-CM | POA: Diagnosis not present

## 2020-05-30 DIAGNOSIS — I152 Hypertension secondary to endocrine disorders: Secondary | ICD-10-CM | POA: Diagnosis not present

## 2020-05-30 DIAGNOSIS — E1122 Type 2 diabetes mellitus with diabetic chronic kidney disease: Secondary | ICD-10-CM | POA: Diagnosis not present

## 2020-05-30 DIAGNOSIS — E7800 Pure hypercholesterolemia, unspecified: Secondary | ICD-10-CM | POA: Diagnosis not present

## 2020-05-30 DIAGNOSIS — E1169 Type 2 diabetes mellitus with other specified complication: Secondary | ICD-10-CM | POA: Diagnosis not present

## 2020-05-30 DIAGNOSIS — N1832 Chronic kidney disease, stage 3b: Secondary | ICD-10-CM | POA: Diagnosis not present

## 2020-05-30 DIAGNOSIS — J4520 Mild intermittent asthma, uncomplicated: Secondary | ICD-10-CM | POA: Diagnosis not present

## 2020-05-30 DIAGNOSIS — F0150 Vascular dementia without behavioral disturbance: Secondary | ICD-10-CM | POA: Diagnosis not present

## 2020-06-13 DIAGNOSIS — E7800 Pure hypercholesterolemia, unspecified: Secondary | ICD-10-CM | POA: Diagnosis not present

## 2020-06-13 DIAGNOSIS — J4520 Mild intermittent asthma, uncomplicated: Secondary | ICD-10-CM | POA: Diagnosis not present

## 2020-06-13 DIAGNOSIS — E1169 Type 2 diabetes mellitus with other specified complication: Secondary | ICD-10-CM | POA: Diagnosis not present

## 2020-06-13 DIAGNOSIS — N1832 Chronic kidney disease, stage 3b: Secondary | ICD-10-CM | POA: Diagnosis not present

## 2020-06-13 DIAGNOSIS — F0150 Vascular dementia without behavioral disturbance: Secondary | ICD-10-CM | POA: Diagnosis not present

## 2020-06-13 DIAGNOSIS — I152 Hypertension secondary to endocrine disorders: Secondary | ICD-10-CM | POA: Diagnosis not present

## 2020-06-13 DIAGNOSIS — E1159 Type 2 diabetes mellitus with other circulatory complications: Secondary | ICD-10-CM | POA: Diagnosis not present

## 2020-06-13 DIAGNOSIS — I69318 Other symptoms and signs involving cognitive functions following cerebral infarction: Secondary | ICD-10-CM | POA: Diagnosis not present

## 2020-06-13 DIAGNOSIS — E1122 Type 2 diabetes mellitus with diabetic chronic kidney disease: Secondary | ICD-10-CM | POA: Diagnosis not present

## 2020-06-26 DIAGNOSIS — E1159 Type 2 diabetes mellitus with other circulatory complications: Secondary | ICD-10-CM | POA: Diagnosis not present

## 2020-06-26 DIAGNOSIS — F0150 Vascular dementia without behavioral disturbance: Secondary | ICD-10-CM | POA: Diagnosis not present

## 2020-06-26 DIAGNOSIS — J4520 Mild intermittent asthma, uncomplicated: Secondary | ICD-10-CM | POA: Diagnosis not present

## 2020-06-26 DIAGNOSIS — E1169 Type 2 diabetes mellitus with other specified complication: Secondary | ICD-10-CM | POA: Diagnosis not present

## 2020-06-26 DIAGNOSIS — N1832 Chronic kidney disease, stage 3b: Secondary | ICD-10-CM | POA: Diagnosis not present

## 2020-06-26 DIAGNOSIS — E7800 Pure hypercholesterolemia, unspecified: Secondary | ICD-10-CM | POA: Diagnosis not present

## 2020-06-26 DIAGNOSIS — I69318 Other symptoms and signs involving cognitive functions following cerebral infarction: Secondary | ICD-10-CM | POA: Diagnosis not present

## 2020-06-26 DIAGNOSIS — E1122 Type 2 diabetes mellitus with diabetic chronic kidney disease: Secondary | ICD-10-CM | POA: Diagnosis not present

## 2020-06-26 DIAGNOSIS — I152 Hypertension secondary to endocrine disorders: Secondary | ICD-10-CM | POA: Diagnosis not present

## 2020-06-27 DIAGNOSIS — E1159 Type 2 diabetes mellitus with other circulatory complications: Secondary | ICD-10-CM | POA: Diagnosis not present

## 2020-06-27 DIAGNOSIS — I152 Hypertension secondary to endocrine disorders: Secondary | ICD-10-CM | POA: Diagnosis not present

## 2020-06-27 DIAGNOSIS — J4520 Mild intermittent asthma, uncomplicated: Secondary | ICD-10-CM | POA: Diagnosis not present

## 2020-06-27 DIAGNOSIS — E1122 Type 2 diabetes mellitus with diabetic chronic kidney disease: Secondary | ICD-10-CM | POA: Diagnosis not present

## 2020-06-27 DIAGNOSIS — F0150 Vascular dementia without behavioral disturbance: Secondary | ICD-10-CM | POA: Diagnosis not present

## 2020-06-27 DIAGNOSIS — E1169 Type 2 diabetes mellitus with other specified complication: Secondary | ICD-10-CM | POA: Diagnosis not present

## 2020-06-27 DIAGNOSIS — E7800 Pure hypercholesterolemia, unspecified: Secondary | ICD-10-CM | POA: Diagnosis not present

## 2020-06-27 DIAGNOSIS — N1832 Chronic kidney disease, stage 3b: Secondary | ICD-10-CM | POA: Diagnosis not present

## 2020-06-27 DIAGNOSIS — I69318 Other symptoms and signs involving cognitive functions following cerebral infarction: Secondary | ICD-10-CM | POA: Diagnosis not present

## 2020-07-11 DIAGNOSIS — E1159 Type 2 diabetes mellitus with other circulatory complications: Secondary | ICD-10-CM | POA: Diagnosis not present

## 2020-07-11 DIAGNOSIS — I69318 Other symptoms and signs involving cognitive functions following cerebral infarction: Secondary | ICD-10-CM | POA: Diagnosis not present

## 2020-07-11 DIAGNOSIS — I152 Hypertension secondary to endocrine disorders: Secondary | ICD-10-CM | POA: Diagnosis not present

## 2020-07-11 DIAGNOSIS — F0150 Vascular dementia without behavioral disturbance: Secondary | ICD-10-CM | POA: Diagnosis not present

## 2020-07-11 DIAGNOSIS — E1122 Type 2 diabetes mellitus with diabetic chronic kidney disease: Secondary | ICD-10-CM | POA: Diagnosis not present

## 2020-07-11 DIAGNOSIS — E1169 Type 2 diabetes mellitus with other specified complication: Secondary | ICD-10-CM | POA: Diagnosis not present

## 2020-07-11 DIAGNOSIS — E7800 Pure hypercholesterolemia, unspecified: Secondary | ICD-10-CM | POA: Diagnosis not present

## 2020-07-11 DIAGNOSIS — N1832 Chronic kidney disease, stage 3b: Secondary | ICD-10-CM | POA: Diagnosis not present

## 2020-07-11 DIAGNOSIS — J4520 Mild intermittent asthma, uncomplicated: Secondary | ICD-10-CM | POA: Diagnosis not present

## 2020-07-23 DIAGNOSIS — D3611 Benign neoplasm of peripheral nerves and autonomic nervous system of face, head, and neck: Secondary | ICD-10-CM | POA: Diagnosis not present

## 2020-07-23 DIAGNOSIS — D485 Neoplasm of uncertain behavior of skin: Secondary | ICD-10-CM | POA: Diagnosis not present

## 2020-07-23 DIAGNOSIS — L820 Inflamed seborrheic keratosis: Secondary | ICD-10-CM | POA: Diagnosis not present

## 2020-07-23 DIAGNOSIS — D234 Other benign neoplasm of skin of scalp and neck: Secondary | ICD-10-CM | POA: Diagnosis not present

## 2020-08-09 DIAGNOSIS — E1169 Type 2 diabetes mellitus with other specified complication: Secondary | ICD-10-CM | POA: Diagnosis not present

## 2020-08-09 DIAGNOSIS — E538 Deficiency of other specified B group vitamins: Secondary | ICD-10-CM | POA: Diagnosis not present

## 2020-08-09 DIAGNOSIS — E1122 Type 2 diabetes mellitus with diabetic chronic kidney disease: Secondary | ICD-10-CM | POA: Diagnosis not present

## 2020-08-09 DIAGNOSIS — N1832 Chronic kidney disease, stage 3b: Secondary | ICD-10-CM | POA: Diagnosis not present

## 2020-08-09 DIAGNOSIS — E785 Hyperlipidemia, unspecified: Secondary | ICD-10-CM | POA: Diagnosis not present

## 2020-08-15 DIAGNOSIS — I152 Hypertension secondary to endocrine disorders: Secondary | ICD-10-CM | POA: Diagnosis not present

## 2020-08-15 DIAGNOSIS — E1169 Type 2 diabetes mellitus with other specified complication: Secondary | ICD-10-CM | POA: Diagnosis not present

## 2020-08-15 DIAGNOSIS — E1159 Type 2 diabetes mellitus with other circulatory complications: Secondary | ICD-10-CM | POA: Diagnosis not present

## 2020-08-15 DIAGNOSIS — E1122 Type 2 diabetes mellitus with diabetic chronic kidney disease: Secondary | ICD-10-CM | POA: Diagnosis not present

## 2020-08-15 DIAGNOSIS — E785 Hyperlipidemia, unspecified: Secondary | ICD-10-CM | POA: Diagnosis not present

## 2020-08-15 DIAGNOSIS — N1831 Chronic kidney disease, stage 3a: Secondary | ICD-10-CM | POA: Diagnosis not present

## 2020-08-15 DIAGNOSIS — F0150 Vascular dementia without behavioral disturbance: Secondary | ICD-10-CM | POA: Diagnosis not present

## 2020-09-04 DIAGNOSIS — R300 Dysuria: Secondary | ICD-10-CM | POA: Diagnosis not present

## 2020-09-04 DIAGNOSIS — R0782 Intercostal pain: Secondary | ICD-10-CM | POA: Diagnosis not present

## 2020-09-04 DIAGNOSIS — R1031 Right lower quadrant pain: Secondary | ICD-10-CM | POA: Diagnosis not present

## 2020-09-04 DIAGNOSIS — N1831 Chronic kidney disease, stage 3a: Secondary | ICD-10-CM | POA: Diagnosis not present

## 2020-09-04 DIAGNOSIS — R197 Diarrhea, unspecified: Secondary | ICD-10-CM | POA: Diagnosis not present

## 2020-09-10 DIAGNOSIS — R197 Diarrhea, unspecified: Secondary | ICD-10-CM | POA: Diagnosis not present

## 2020-09-10 DIAGNOSIS — N1831 Chronic kidney disease, stage 3a: Secondary | ICD-10-CM | POA: Diagnosis not present

## 2020-09-10 DIAGNOSIS — R1031 Right lower quadrant pain: Secondary | ICD-10-CM | POA: Diagnosis not present

## 2020-10-31 DIAGNOSIS — J45909 Unspecified asthma, uncomplicated: Secondary | ICD-10-CM | POA: Diagnosis not present

## 2020-10-31 DIAGNOSIS — F3341 Major depressive disorder, recurrent, in partial remission: Secondary | ICD-10-CM | POA: Diagnosis not present

## 2020-10-31 DIAGNOSIS — E669 Obesity, unspecified: Secondary | ICD-10-CM | POA: Diagnosis not present

## 2020-10-31 DIAGNOSIS — G8929 Other chronic pain: Secondary | ICD-10-CM | POA: Diagnosis not present

## 2020-10-31 DIAGNOSIS — Z8249 Family history of ischemic heart disease and other diseases of the circulatory system: Secondary | ICD-10-CM | POA: Diagnosis not present

## 2020-10-31 DIAGNOSIS — E785 Hyperlipidemia, unspecified: Secondary | ICD-10-CM | POA: Diagnosis not present

## 2020-10-31 DIAGNOSIS — Z6834 Body mass index (BMI) 34.0-34.9, adult: Secondary | ICD-10-CM | POA: Diagnosis not present

## 2020-10-31 DIAGNOSIS — I10 Essential (primary) hypertension: Secondary | ICD-10-CM | POA: Diagnosis not present

## 2020-10-31 DIAGNOSIS — Z85828 Personal history of other malignant neoplasm of skin: Secondary | ICD-10-CM | POA: Diagnosis not present

## 2020-12-16 DIAGNOSIS — R260 Ataxic gait: Secondary | ICD-10-CM | POA: Diagnosis not present

## 2020-12-16 DIAGNOSIS — R2689 Other abnormalities of gait and mobility: Secondary | ICD-10-CM | POA: Diagnosis not present

## 2020-12-16 DIAGNOSIS — M545 Low back pain, unspecified: Secondary | ICD-10-CM | POA: Diagnosis not present

## 2020-12-16 DIAGNOSIS — R2681 Unsteadiness on feet: Secondary | ICD-10-CM | POA: Diagnosis not present

## 2020-12-19 DIAGNOSIS — R2681 Unsteadiness on feet: Secondary | ICD-10-CM | POA: Diagnosis not present

## 2020-12-19 DIAGNOSIS — R2689 Other abnormalities of gait and mobility: Secondary | ICD-10-CM | POA: Diagnosis not present

## 2020-12-19 DIAGNOSIS — M545 Low back pain, unspecified: Secondary | ICD-10-CM | POA: Diagnosis not present

## 2020-12-19 DIAGNOSIS — R260 Ataxic gait: Secondary | ICD-10-CM | POA: Diagnosis not present

## 2020-12-24 DIAGNOSIS — R2681 Unsteadiness on feet: Secondary | ICD-10-CM | POA: Diagnosis not present

## 2020-12-24 DIAGNOSIS — R260 Ataxic gait: Secondary | ICD-10-CM | POA: Diagnosis not present

## 2020-12-24 DIAGNOSIS — R2689 Other abnormalities of gait and mobility: Secondary | ICD-10-CM | POA: Diagnosis not present

## 2020-12-24 DIAGNOSIS — M545 Low back pain, unspecified: Secondary | ICD-10-CM | POA: Diagnosis not present

## 2020-12-26 DIAGNOSIS — R2681 Unsteadiness on feet: Secondary | ICD-10-CM | POA: Diagnosis not present

## 2020-12-26 DIAGNOSIS — M545 Low back pain, unspecified: Secondary | ICD-10-CM | POA: Diagnosis not present

## 2020-12-26 DIAGNOSIS — R2689 Other abnormalities of gait and mobility: Secondary | ICD-10-CM | POA: Diagnosis not present

## 2020-12-26 DIAGNOSIS — R260 Ataxic gait: Secondary | ICD-10-CM | POA: Diagnosis not present

## 2020-12-30 DIAGNOSIS — M545 Low back pain, unspecified: Secondary | ICD-10-CM | POA: Diagnosis not present

## 2020-12-30 DIAGNOSIS — R2681 Unsteadiness on feet: Secondary | ICD-10-CM | POA: Diagnosis not present

## 2020-12-30 DIAGNOSIS — R2689 Other abnormalities of gait and mobility: Secondary | ICD-10-CM | POA: Diagnosis not present

## 2020-12-30 DIAGNOSIS — R260 Ataxic gait: Secondary | ICD-10-CM | POA: Diagnosis not present

## 2021-01-02 DIAGNOSIS — M545 Low back pain, unspecified: Secondary | ICD-10-CM | POA: Diagnosis not present

## 2021-01-02 DIAGNOSIS — R2681 Unsteadiness on feet: Secondary | ICD-10-CM | POA: Diagnosis not present

## 2021-01-02 DIAGNOSIS — R260 Ataxic gait: Secondary | ICD-10-CM | POA: Diagnosis not present

## 2021-01-02 DIAGNOSIS — R2689 Other abnormalities of gait and mobility: Secondary | ICD-10-CM | POA: Diagnosis not present

## 2021-01-07 DIAGNOSIS — R260 Ataxic gait: Secondary | ICD-10-CM | POA: Diagnosis not present

## 2021-01-07 DIAGNOSIS — M545 Low back pain, unspecified: Secondary | ICD-10-CM | POA: Diagnosis not present

## 2021-01-07 DIAGNOSIS — R2681 Unsteadiness on feet: Secondary | ICD-10-CM | POA: Diagnosis not present

## 2021-01-07 DIAGNOSIS — R2689 Other abnormalities of gait and mobility: Secondary | ICD-10-CM | POA: Diagnosis not present

## 2021-01-09 DIAGNOSIS — M545 Low back pain, unspecified: Secondary | ICD-10-CM | POA: Diagnosis not present

## 2021-01-09 DIAGNOSIS — R260 Ataxic gait: Secondary | ICD-10-CM | POA: Diagnosis not present

## 2021-01-09 DIAGNOSIS — R2681 Unsteadiness on feet: Secondary | ICD-10-CM | POA: Diagnosis not present

## 2021-01-09 DIAGNOSIS — R2689 Other abnormalities of gait and mobility: Secondary | ICD-10-CM | POA: Diagnosis not present

## 2021-01-15 DIAGNOSIS — R260 Ataxic gait: Secondary | ICD-10-CM | POA: Diagnosis not present

## 2021-01-15 DIAGNOSIS — R2681 Unsteadiness on feet: Secondary | ICD-10-CM | POA: Diagnosis not present

## 2021-01-15 DIAGNOSIS — R2689 Other abnormalities of gait and mobility: Secondary | ICD-10-CM | POA: Diagnosis not present

## 2021-01-15 DIAGNOSIS — M545 Low back pain, unspecified: Secondary | ICD-10-CM | POA: Diagnosis not present

## 2021-01-16 DIAGNOSIS — R260 Ataxic gait: Secondary | ICD-10-CM | POA: Diagnosis not present

## 2021-01-16 DIAGNOSIS — R2689 Other abnormalities of gait and mobility: Secondary | ICD-10-CM | POA: Diagnosis not present

## 2021-01-16 DIAGNOSIS — M545 Low back pain, unspecified: Secondary | ICD-10-CM | POA: Diagnosis not present

## 2021-01-16 DIAGNOSIS — R2681 Unsteadiness on feet: Secondary | ICD-10-CM | POA: Diagnosis not present

## 2021-01-21 DIAGNOSIS — R260 Ataxic gait: Secondary | ICD-10-CM | POA: Diagnosis not present

## 2021-01-21 DIAGNOSIS — R2689 Other abnormalities of gait and mobility: Secondary | ICD-10-CM | POA: Diagnosis not present

## 2021-01-21 DIAGNOSIS — M545 Low back pain, unspecified: Secondary | ICD-10-CM | POA: Diagnosis not present

## 2021-01-21 DIAGNOSIS — R2681 Unsteadiness on feet: Secondary | ICD-10-CM | POA: Diagnosis not present

## 2021-01-23 DIAGNOSIS — R260 Ataxic gait: Secondary | ICD-10-CM | POA: Diagnosis not present

## 2021-01-23 DIAGNOSIS — R2681 Unsteadiness on feet: Secondary | ICD-10-CM | POA: Diagnosis not present

## 2021-01-23 DIAGNOSIS — M545 Low back pain, unspecified: Secondary | ICD-10-CM | POA: Diagnosis not present

## 2021-01-23 DIAGNOSIS — R2689 Other abnormalities of gait and mobility: Secondary | ICD-10-CM | POA: Diagnosis not present

## 2021-01-28 DIAGNOSIS — M545 Low back pain, unspecified: Secondary | ICD-10-CM | POA: Diagnosis not present

## 2021-01-28 DIAGNOSIS — R260 Ataxic gait: Secondary | ICD-10-CM | POA: Diagnosis not present

## 2021-01-28 DIAGNOSIS — R2689 Other abnormalities of gait and mobility: Secondary | ICD-10-CM | POA: Diagnosis not present

## 2021-01-28 DIAGNOSIS — R2681 Unsteadiness on feet: Secondary | ICD-10-CM | POA: Diagnosis not present

## 2021-01-30 DIAGNOSIS — R2689 Other abnormalities of gait and mobility: Secondary | ICD-10-CM | POA: Diagnosis not present

## 2021-01-30 DIAGNOSIS — R260 Ataxic gait: Secondary | ICD-10-CM | POA: Diagnosis not present

## 2021-01-30 DIAGNOSIS — R2681 Unsteadiness on feet: Secondary | ICD-10-CM | POA: Diagnosis not present

## 2021-01-30 DIAGNOSIS — M545 Low back pain, unspecified: Secondary | ICD-10-CM | POA: Diagnosis not present

## 2021-02-04 DIAGNOSIS — M545 Low back pain, unspecified: Secondary | ICD-10-CM | POA: Diagnosis not present

## 2021-02-04 DIAGNOSIS — R260 Ataxic gait: Secondary | ICD-10-CM | POA: Diagnosis not present

## 2021-02-04 DIAGNOSIS — R2689 Other abnormalities of gait and mobility: Secondary | ICD-10-CM | POA: Diagnosis not present

## 2021-02-04 DIAGNOSIS — R2681 Unsteadiness on feet: Secondary | ICD-10-CM | POA: Diagnosis not present

## 2021-02-05 DIAGNOSIS — R2681 Unsteadiness on feet: Secondary | ICD-10-CM | POA: Diagnosis not present

## 2021-02-05 DIAGNOSIS — M545 Low back pain, unspecified: Secondary | ICD-10-CM | POA: Diagnosis not present

## 2021-02-05 DIAGNOSIS — R260 Ataxic gait: Secondary | ICD-10-CM | POA: Diagnosis not present

## 2021-02-05 DIAGNOSIS — R2689 Other abnormalities of gait and mobility: Secondary | ICD-10-CM | POA: Diagnosis not present

## 2021-02-11 DIAGNOSIS — R2689 Other abnormalities of gait and mobility: Secondary | ICD-10-CM | POA: Diagnosis not present

## 2021-02-11 DIAGNOSIS — R260 Ataxic gait: Secondary | ICD-10-CM | POA: Diagnosis not present

## 2021-02-11 DIAGNOSIS — R2681 Unsteadiness on feet: Secondary | ICD-10-CM | POA: Diagnosis not present

## 2021-02-11 DIAGNOSIS — M545 Low back pain, unspecified: Secondary | ICD-10-CM | POA: Diagnosis not present

## 2022-08-15 DEATH — deceased
